# Patient Record
Sex: Male | Born: 1940
Health system: Southern US, Community
[De-identification: ages and names within clinical notes are randomized; demographics above are authoritative.]

## PROBLEM LIST (undated history)

## (undated) DIAGNOSIS — J302 Other seasonal allergic rhinitis: Secondary | ICD-10-CM

## (undated) DIAGNOSIS — E785 Hyperlipidemia, unspecified: Secondary | ICD-10-CM

## (undated) DIAGNOSIS — M1990 Unspecified osteoarthritis, unspecified site: Secondary | ICD-10-CM

## (undated) DIAGNOSIS — L570 Actinic keratosis: Secondary | ICD-10-CM

## (undated) DIAGNOSIS — R569 Unspecified convulsions: Secondary | ICD-10-CM

## (undated) DIAGNOSIS — E209 Hypoparathyroidism, unspecified: Secondary | ICD-10-CM

## (undated) DIAGNOSIS — I10 Essential (primary) hypertension: Secondary | ICD-10-CM

## (undated) DIAGNOSIS — H269 Unspecified cataract: Secondary | ICD-10-CM

## (undated) DIAGNOSIS — J45909 Unspecified asthma, uncomplicated: Secondary | ICD-10-CM

## (undated) DIAGNOSIS — L709 Acne, unspecified: Secondary | ICD-10-CM

## (undated) HISTORY — DX: Unspecified osteoarthritis, unspecified site: M19.90

## (undated) HISTORY — DX: Unspecified cataract: H26.9

## (undated) HISTORY — DX: Acne, unspecified: L70.9

## (undated) HISTORY — DX: Hyperlipidemia, unspecified: E78.5

## (undated) HISTORY — DX: Actinic keratosis: L57.0

## (undated) HISTORY — DX: Other seasonal allergic rhinitis: J30.2

## (undated) HISTORY — PX: CATARACT EXTRACTION, BILATERAL: SHX1313

## (undated) HISTORY — PX: OTHER SURGICAL HISTORY: SHX169

## (undated) HISTORY — PX: APPENDECTOMY: SHX54

## (undated) HISTORY — PX: REPLACEMENT TOTAL KNEE: SUR1224

## (undated) HISTORY — DX: Unspecified asthma, uncomplicated: J45.909

## (undated) HISTORY — DX: Hypoparathyroidism, unspecified: E20.9

## (undated) HISTORY — DX: Unspecified convulsions: R56.9

---

## 2004-12-15 ENCOUNTER — Encounter: Admission: RE | Admit: 2004-12-15 | Discharge: 2004-12-15 | Payer: Self-pay | Admitting: Sports Medicine

## 2005-02-22 ENCOUNTER — Ambulatory Visit: Payer: Self-pay | Admitting: Family Medicine

## 2005-03-01 ENCOUNTER — Ambulatory Visit: Payer: Self-pay | Admitting: Family Medicine

## 2005-03-26 ENCOUNTER — Ambulatory Visit: Payer: Self-pay | Admitting: Family Medicine

## 2005-04-23 ENCOUNTER — Ambulatory Visit: Payer: Self-pay | Admitting: Family Medicine

## 2006-04-08 ENCOUNTER — Ambulatory Visit: Payer: Self-pay | Admitting: Family Medicine

## 2006-04-15 ENCOUNTER — Ambulatory Visit: Payer: Self-pay | Admitting: Family Medicine

## 2006-04-23 ENCOUNTER — Ambulatory Visit: Payer: Self-pay | Admitting: Family Medicine

## 2007-04-24 DIAGNOSIS — J309 Allergic rhinitis, unspecified: Secondary | ICD-10-CM | POA: Insufficient documentation

## 2007-04-24 DIAGNOSIS — L719 Rosacea, unspecified: Secondary | ICD-10-CM | POA: Insufficient documentation

## 2007-05-05 ENCOUNTER — Ambulatory Visit: Payer: Self-pay | Admitting: Family Medicine

## 2007-05-05 LAB — CONVERTED CEMR LAB
ALT: 24 units/L (ref 0–53)
AST: 23 units/L (ref 0–37)
Albumin: 4 g/dL (ref 3.5–5.2)
Alkaline Phosphatase: 66 units/L (ref 39–117)
Basophils Relative: 2.9 % — ABNORMAL HIGH (ref 0.0–1.0)
Chloride: 105 meq/L (ref 96–112)
Eosinophils Absolute: 0.8 10*3/uL — ABNORMAL HIGH (ref 0.0–0.6)
Eosinophils Relative: 9.9 % — ABNORMAL HIGH (ref 0.0–5.0)
GFR calc Af Amer: 97 mL/min
GFR calc non Af Amer: 80 mL/min
Glucose, Urine, Semiquant: NEGATIVE
HCT: 43.7 % (ref 39.0–52.0)
Ketones, urine, test strip: NEGATIVE
MCHC: 36.1 g/dL — ABNORMAL HIGH (ref 30.0–36.0)
Neutro Abs: 4.6 10*3/uL (ref 1.4–7.7)
Nitrite: NEGATIVE
PSA: 1.23 ng/mL (ref 0.10–4.00)
Potassium: 3.9 meq/L (ref 3.5–5.1)
RDW: 12 % (ref 11.5–14.6)
Total Bilirubin: 1 mg/dL (ref 0.3–1.2)
Total CHOL/HDL Ratio: 4.9
WBC: 7.6 10*3/uL (ref 4.5–10.5)

## 2007-05-12 ENCOUNTER — Ambulatory Visit: Payer: Self-pay | Admitting: Family Medicine

## 2007-05-12 DIAGNOSIS — E785 Hyperlipidemia, unspecified: Secondary | ICD-10-CM | POA: Insufficient documentation

## 2007-05-12 DIAGNOSIS — M2410 Other articular cartilage disorders, unspecified site: Secondary | ICD-10-CM | POA: Insufficient documentation

## 2007-05-14 ENCOUNTER — Encounter (INDEPENDENT_AMBULATORY_CARE_PROVIDER_SITE_OTHER): Payer: Self-pay | Admitting: *Deleted

## 2007-05-15 ENCOUNTER — Ambulatory Visit: Payer: Self-pay | Admitting: Gastroenterology

## 2007-05-29 ENCOUNTER — Encounter: Payer: Self-pay | Admitting: Gastroenterology

## 2007-05-29 ENCOUNTER — Encounter: Payer: Self-pay | Admitting: Family Medicine

## 2007-05-29 ENCOUNTER — Ambulatory Visit: Payer: Self-pay | Admitting: Gastroenterology

## 2007-11-07 ENCOUNTER — Encounter: Payer: Self-pay | Admitting: Family Medicine

## 2008-02-09 ENCOUNTER — Ambulatory Visit: Payer: Self-pay | Admitting: Family Medicine

## 2008-02-09 DIAGNOSIS — H6120 Impacted cerumen, unspecified ear: Secondary | ICD-10-CM | POA: Insufficient documentation

## 2008-05-04 ENCOUNTER — Ambulatory Visit: Payer: Self-pay | Admitting: Family Medicine

## 2008-05-04 LAB — CONVERTED CEMR LAB
Albumin: 4.1 g/dL (ref 3.5–5.2)
Basophils Absolute: 0 10*3/uL (ref 0.0–0.1)
Basophils Relative: 0.4 % (ref 0.0–3.0)
Bilirubin, Direct: 0.1 mg/dL (ref 0.0–0.3)
Creatinine, Ser: 1.1 mg/dL (ref 0.4–1.5)
Eosinophils Absolute: 0.5 10*3/uL (ref 0.0–0.7)
Eosinophils Relative: 7.2 % — ABNORMAL HIGH (ref 0.0–5.0)
GFR calc Af Amer: 86 mL/min
GFR calc non Af Amer: 71 mL/min
Glucose, Bld: 90 mg/dL (ref 70–99)
Glucose, Urine, Semiquant: NEGATIVE
Hemoglobin: 16 g/dL (ref 13.0–17.0)
MCV: 97 fL (ref 78.0–100.0)
Neutro Abs: 4.6 10*3/uL (ref 1.4–7.7)
Neutrophils Relative %: 60.3 % (ref 43.0–77.0)
Nitrite: NEGATIVE
Protein, U semiquant: NEGATIVE
Total Bilirubin: 1.1 mg/dL (ref 0.3–1.2)
Total CHOL/HDL Ratio: 4.1
Total Protein: 7.5 g/dL (ref 6.0–8.3)
Urobilinogen, UA: 0.2
WBC: 7.6 10*3/uL (ref 4.5–10.5)

## 2008-05-20 ENCOUNTER — Ambulatory Visit: Payer: Self-pay | Admitting: Family Medicine

## 2008-05-20 DIAGNOSIS — M1990 Unspecified osteoarthritis, unspecified site: Secondary | ICD-10-CM

## 2008-06-16 ENCOUNTER — Telehealth: Payer: Self-pay | Admitting: Family Medicine

## 2008-06-28 ENCOUNTER — Telehealth: Payer: Self-pay | Admitting: Family Medicine

## 2008-06-29 ENCOUNTER — Telehealth: Payer: Self-pay | Admitting: Family Medicine

## 2008-06-29 ENCOUNTER — Ambulatory Visit: Payer: Self-pay | Admitting: Family Medicine

## 2008-07-07 ENCOUNTER — Ambulatory Visit: Payer: Self-pay | Admitting: Family Medicine

## 2009-05-19 ENCOUNTER — Telehealth: Payer: Self-pay | Admitting: Family Medicine

## 2009-05-24 ENCOUNTER — Ambulatory Visit: Payer: Self-pay | Admitting: Family Medicine

## 2009-05-24 DIAGNOSIS — I10 Essential (primary) hypertension: Secondary | ICD-10-CM | POA: Insufficient documentation

## 2009-05-26 ENCOUNTER — Telehealth: Payer: Self-pay | Admitting: Family Medicine

## 2009-05-31 ENCOUNTER — Telehealth (INDEPENDENT_AMBULATORY_CARE_PROVIDER_SITE_OTHER): Payer: Self-pay | Admitting: *Deleted

## 2009-06-01 ENCOUNTER — Encounter: Payer: Self-pay | Admitting: Internal Medicine

## 2009-06-01 ENCOUNTER — Ambulatory Visit: Payer: Self-pay

## 2009-06-21 ENCOUNTER — Encounter: Payer: Self-pay | Admitting: *Deleted

## 2009-06-21 ENCOUNTER — Telehealth: Payer: Self-pay | Admitting: Family Medicine

## 2009-06-27 ENCOUNTER — Inpatient Hospital Stay (HOSPITAL_COMMUNITY): Admission: RE | Admit: 2009-06-27 | Discharge: 2009-06-29 | Payer: Self-pay | Admitting: Orthopedic Surgery

## 2009-08-24 ENCOUNTER — Ambulatory Visit: Payer: Self-pay | Admitting: Family Medicine

## 2009-10-31 ENCOUNTER — Inpatient Hospital Stay (HOSPITAL_COMMUNITY): Admission: RE | Admit: 2009-10-31 | Discharge: 2009-11-02 | Payer: Self-pay | Admitting: Orthopedic Surgery

## 2010-06-01 ENCOUNTER — Ambulatory Visit: Payer: Self-pay | Admitting: Family Medicine

## 2010-06-07 ENCOUNTER — Telehealth: Payer: Self-pay | Admitting: Family Medicine

## 2010-07-18 ENCOUNTER — Telehealth: Payer: Self-pay | Admitting: Family Medicine

## 2010-07-21 ENCOUNTER — Ambulatory Visit: Payer: Self-pay | Admitting: Family Medicine

## 2010-11-12 LAB — CONVERTED CEMR LAB
ALT: 23 units/L (ref 0–53)
ALT: 28 units/L (ref 0–53)
Albumin: 4.1 g/dL (ref 3.5–5.2)
Albumin: 4.2 g/dL (ref 3.5–5.2)
Alkaline Phosphatase: 76 units/L (ref 39–117)
Alkaline Phosphatase: 79 units/L (ref 39–117)
BUN: 22 mg/dL (ref 6–23)
Basophils Relative: 0.3 % (ref 0.0–3.0)
Basophils Relative: 0.5 % (ref 0.0–3.0)
Bilirubin, Direct: 0.1 mg/dL (ref 0.0–0.3)
Blood in Urine, dipstick: NEGATIVE
CO2: 30 meq/L (ref 19–32)
Calcium: 10.3 mg/dL (ref 8.4–10.5)
Calcium: 10.6 mg/dL — ABNORMAL HIGH (ref 8.4–10.5)
Chloride: 104 meq/L (ref 96–112)
Cholesterol: 125 mg/dL (ref 0–200)
Creatinine, Ser: 1 mg/dL (ref 0.4–1.5)
Creatinine, Ser: 1.1 mg/dL (ref 0.4–1.5)
Eosinophils Absolute: 0.4 10*3/uL (ref 0.0–0.7)
Eosinophils Relative: 5.4 % — ABNORMAL HIGH (ref 0.0–5.0)
GFR calc non Af Amer: 70.85 mL/min (ref >60)
Glucose, Bld: 75 mg/dL (ref 70–99)
Glucose, Urine, Semiquant: NEGATIVE
Glucose, Urine, Semiquant: NEGATIVE
HCT: 43.9 % (ref 39.0–52.0)
Hemoglobin: 15.1 g/dL (ref 13.0–17.0)
LDL Cholesterol: 65 mg/dL (ref 0–99)
LDL Cholesterol: 67 mg/dL (ref 0–99)
Lymphocytes Relative: 29.2 % (ref 12.0–46.0)
MCV: 96.5 fL (ref 78.0–100.0)
Monocytes Relative: 7.6 % (ref 3.0–12.0)
Monocytes Relative: 8.5 % (ref 3.0–12.0)
Neutro Abs: 6.1 10*3/uL (ref 1.4–7.7)
Neutrophils Relative %: 56.4 % (ref 43.0–77.0)
PSA: 1.31 ng/mL (ref 0.10–4.00)
PSA: 1.51 ng/mL (ref 0.10–4.00)
Platelets: 179 10*3/uL (ref 150.0–400.0)
Platelets: 198 10*3/uL (ref 150.0–400.0)
Potassium: 4.6 meq/L (ref 3.5–5.1)
Potassium: 4.9 meq/L (ref 3.5–5.1)
RBC: 4.79 M/uL (ref 4.22–5.81)
RDW: 11.6 % (ref 11.5–14.6)
RDW: 12.8 % (ref 11.5–14.6)
Specific Gravity, Urine: 1.02
TSH: 0.82 microintl units/mL (ref 0.35–5.50)
TSH: 0.97 microintl units/mL (ref 0.35–5.50)
Total Protein: 7.3 g/dL (ref 6.0–8.3)
Triglycerides: 143 mg/dL (ref 0.0–149.0)
Urobilinogen, UA: 0.2
Urobilinogen, UA: 0.2
VLDL: 26 mg/dL (ref 0.0–40.0)
VLDL: 28.6 mg/dL (ref 0.0–40.0)
WBC Urine, dipstick: NEGATIVE
WBC Urine, dipstick: NEGATIVE
WBC: 8.1 10*3/uL (ref 4.5–10.5)
pH: 5.5

## 2010-11-15 NOTE — Progress Notes (Signed)
Summary: lab results  Phone Note Call from Patient Call back at Home Phone [phone]   Caller: Spouse Call For: Roderick Pee MD Summary of Call: Wife is asking questions regarding pt's labs from [facility].  Please call. Initial call taken by: Lynann Beaver CMA,  June 07, 2010 10:09 AM  Follow-up for Phone Call         Fleet Contras please call ......... labs normal>>>>>>>>>>//////// Follow-up by: Roderick Pee MD,  June 07, 2010 10:55 AM  Additional Follow-up for Phone Call Additional follow up Details #1::        Phone Call Completed Additional Follow-up by: Kern Reap CMA Duncan Dull),  June 07, 2010 12:59 PM

## 2010-11-15 NOTE — Progress Notes (Signed)
Summary: questions about flu clinic?  Phone Note Call from Patient Call back at Home Phone [phone]   Caller: Patient Call For: Roderick Pee MD Action Taken: Provider Notified Summary of Call: Pt is calling to see if he could come to a flu clinic and get 1.  flu shot   2.  shingles injection   3.  pneumonia (last pneumonia was08/07/2009) Call back with appt please. Initial call taken by: Lynann Beaver CMA,  July 18, 2010 9:58 AM  Follow-up for Phone Call        Fleet Contras please call Follow-up by: Roderick Pee MD,  July 18, 2010 10:09 AM  Additional Follow-up for Phone Call Additional follow up Details #1::        pt will call back to schedule an injection appointment Additional Follow-up by: Kern Reap CMA Duncan Dull),  July 18, 2010 10:32 AM

## 2010-11-15 NOTE — Assessment & Plan Note (Signed)
Summary: [facility] (PT WILL COME IN FASTING) // RS   Vital Signs:  Patient profile:   70 year old male Height:      69.5 inches Weight:      184 pounds BMI:     26.88 Temp:     98.2 degrees F oral BP sitting:   120 / 80  (left arm) Cuff size:   regular  Vitals Entered By: Kern Reap CMA Duncan Dull) (June 01, 2010 10:33 AM) CC: wellness exam Is Patient Diabetic? No   CC:  wellness exam.  History of Present Illness: Paul Lowe is a 70 year old, married male, nonsmoker professor, still working part time, who comes in today for physical examination and to discuss allergic rhinitis, hyperlipidemia, hypertension, and osteoarthritis.  For his allergic rhinitis.  He takes Claritin 10 mg daily.  He also takes Niaspan 500 mg daily and simvastatin 20 mg nightly for hyperlipidemia.  Will check lipid panel today.  He takes Zestoretic 20 -- 2.5 daily for hypertension. bp........Marland Kitchen 120/80.  He takes Mobic 15 mg daily for osteoarthritis, and 181 mg, baby aspirin daily.  He gets routine eye care.  Dental care.  Colonoscopy normal, tetanus, 2006, seasonal flu 2010, Pneumovax 2010, cardiac stress test last year.  Prior to knee surgery, normal.  He's had both knees replaced as noted above. Here for Medicare AWV:  1.   Risk factors based on Past M, S, F history:......reviewed in detail in a chain 2.   Physical Activities:Marland Kitchen..walks daily  3.   Depression/mood: good mood.  No depression 4.   Hearing: normal 5.   ADL's: reviewed.  No changes 6.   Fall Risk: reviewed none 7.   Home Safety: reviewed.  No changes 8.   Height, weight, &visual acuity:height weight, normal bilateral cataract removal and lens implants.  Vision normal 9.   Counseling: continue exercise program and medications 10.   Labs ordered based on risk factors: done today 11.           Referral Coordination.........none indicated 12.           Care Plan/////////reviewed in detail 13.            Cognitive Assessment   Allergies: 1)  !  Shrimp Flavor (Flavoring Agent)  Past History:  Past medical, surgical, family and social histories (including risk factors) reviewed, and no changes noted (except as noted below).  Past Medical History: Reviewed history from 05/20/2008 and no changes required. Allergic rhinitis Hyperlipidemia Osteoarthritis adult acne appendectomy  anal cystsbilateral cataracts  Family History: Reviewed history from 05/20/2008 and no changes required.  father died age 20 of a plane crash.  No underlying diseases mother died in her 24s of a strokeone half brother in good health  Social History: Reviewed history from 02/09/2008 and no changes required. 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  The summer school Never Smoked Alcohol use-no Drug use-no Regular exercise-yes  Review of Systems      See HPI  Physical Exam  General:  Well-developed,well-nourished,in no acute distress; alert,appropriate and cooperative throughout examination Head:  Normocephalic and atraumatic without obvious abnormalities. No apparent alopecia or balding. Eyes:  No corneal or conjunctival inflammation noted. EOMI. Perrla. Funduscopic exam benign, without hemorrhages, exudates or papilledema. Vision grossly normal. Ears:  External ear exam shows no significant lesions or deformities.  Otoscopic examination reveals clear canals, tympanic membranes are intact bilaterally without bulging, retraction, inflammation or discharge. Hearing is grossly normal bilaterally. Nose:  External  nasal examination shows no deformity or inflammation. Nasal mucosa are pink and moist without lesions or exudates. Mouth:  Oral mucosa and oropharynx without lesions or exudates.  Teeth in good repair. Neck:  No deformities, masses, or tenderness noted. Chest Wall:  No deformities, masses, tenderness or gynecomastia noted. Breasts:  No masses or gynecomastia noted Lungs:   Normal respiratory effort, chest expands symmetrically. Lungs are clear to auscultation, no crackles or wheezes. Heart:  Normal rate and regular rhythm. S1 and S2 normal without gallop, murmur, click, rub or other extra sounds. Abdomen:  Bowel sounds positive,abdomen soft and non-tender without masses, organomegaly or hernias noted. Rectal:  No external abnormalities noted. Normal sphincter tone. No rectal masses or tenderness. Genitalia:  Testes bilaterally descended without nodularity, tenderness or masses. No scrotal masses or lesions. No penis lesions or urethral discharge. Prostate:  Prostate gland firm and smooth, no enlargement, nodularity, tenderness, mass, asymmetry or induration. Msk:  No deformity or scoliosis noted of thoracic or lumbar spine.   Pulses:  R and L carotid,radial,femoral,dorsalis pedis and posterior tibial pulses are full and equal bilaterally Extremities:  No clubbing, cyanosis, edema, or deformity noted with normal full range of motion of all joints.   Neurologic:  No cranial nerve deficits noted. Station and gait are normal. Plantar reflexes are down-going bilaterally. DTRs are symmetrical throughout. Sensory, motor and coordinative functions appear intact. Skin:  Intact without suspicious lesions or rashes Cervical Nodes:  No lymphadenopathy noted Axillary Nodes:  No palpable lymphadenopathy Inguinal Nodes:  No significant adenopathy Psych:  Cognition and judgment appear intact. Alert and cooperative with normal attention span and concentration. No apparent delusions, illusions, hallucinations   Impression & Recommendations:  Problem # 1:  HYPERTENSION NEC (ICD-997.91) Assessment Improved  Orders: Venipuncture (95621) TLB-Lipid Panel (80061-LIPID) TLB-BMP (Basic Metabolic Panel-BMET) (80048-METABOL) TLB-CBC Platelet - w/Differential (85025-CBCD) TLB-Hepatic/Liver Function Pnl (80076-HEPATIC) TLB-TSH (Thyroid Stimulating Hormone) (84443-TSH) TLB-PSA (Prostate  Specific Antigen) (84153-PSA) Prescription Created Electronically [phone]) Medicare -1st Annual Wellness Visit [phone]) Urinalysis-dipstick only (Medicare patient) (62952WU) EKG w/ Interpretation (93000)  Problem # 2:  OSTEOARTHRITIS (ICD-715.90) Assessment: Improved  The following medications were removed from the medication list:    Naproxen 500 Mg Tabs (Naproxen) ..... Once daily    Nabumetone 500 Mg Tabs (Nabumetone) ..... Once daily His updated medication list for this problem includes:    Adult Aspirin Ec Low Strength 81 Mg Tbec (Aspirin) ..... Once daily    Mobic 15 Mg Tabs (Meloxicam) .Marland Kitchen... Take one tab once daily  Orders: Venipuncture (13244) TLB-Lipid Panel (80061-LIPID) TLB-BMP (Basic Metabolic Panel-BMET) (80048-METABOL) TLB-CBC Platelet - w/Differential (85025-CBCD) TLB-Hepatic/Liver Function Pnl (80076-HEPATIC) TLB-TSH (Thyroid Stimulating Hormone) (84443-TSH) TLB-PSA (Prostate Specific Antigen) (84153-PSA) Prescription Created Electronically [phone]) Medicare -1st Annual Wellness Visit [phone]) Urinalysis-dipstick only (Medicare patient) (44034VQ)  Problem # 3:  HYPERLIPIDEMIA (ICD-272.4) Assessment: Improved  His updated medication list for this problem includes:    Simvastatin 40 Mg Tabs (Simvastatin) .Marland Kitchen... 1/2 tab at bedtime    Niaspan 500 Mg Tbcr (Niacin (antihyperlipidemic)) ..... Qd  Orders: Venipuncture (25956) TLB-Lipid Panel (80061-LIPID) TLB-BMP (Basic Metabolic Panel-BMET) (80048-METABOL) TLB-CBC Platelet - w/Differential (85025-CBCD) TLB-Hepatic/Liver Function Pnl (80076-HEPATIC) TLB-TSH (Thyroid Stimulating Hormone) (84443-TSH) TLB-PSA (Prostate Specific Antigen) (84153-PSA) Prescription Created Electronically [phone]) Medicare -1st Annual Wellness Visit [phone]) Urinalysis-dipstick only (Medicare patient) (51884ZY) EKG w/ Interpretation (93000)  Problem # 4:  HEALTH SCREENING (ICD-V70.0) Assessment: Unchanged  Orders: Venipuncture  (60630) TLB-Lipid Panel (80061-LIPID) TLB-BMP (Basic Metabolic Panel-BMET) (80048-METABOL) TLB-CBC Platelet - w/Differential (85025-CBCD) TLB-Hepatic/Liver Function Pnl (80076-HEPATIC) TLB-TSH (Thyroid  Stimulating Hormone) (84443-TSH) TLB-PSA (Prostate Specific Antigen) (84153-PSA) Prescription Created Electronically [phone]) Medicare -1st Annual Wellness Visit [phone]) Urinalysis-dipstick only (Medicare patient) [phone])  Problem # 5:  ACNE ROSACEA (ICD-695.3) Assessment: Improved  Orders: Venipuncture (47829) TLB-Lipid Panel (80061-LIPID) TLB-BMP (Basic Metabolic Panel-BMET) (80048-METABOL) TLB-CBC Platelet - w/Differential (85025-CBCD) TLB-Hepatic/Liver Function Pnl (80076-HEPATIC) TLB-TSH (Thyroid Stimulating Hormone) (84443-TSH) TLB-PSA (Prostate Specific Antigen) (84153-PSA) Prescription Created Electronically [phone]) Medicare -1st Annual Wellness Visit [phone]) Urinalysis-dipstick only (Medicare patient) (84696EX)  Problem # 6:  ALLERGIC RHINITIS (ICD-477.9) Assessment: Improved  His updated medication list for this problem includes:    Claritin 10 Mg Tabs (Loratadine) ..... Qd  Orders: Venipuncture (52841) TLB-Lipid Panel (80061-LIPID) TLB-BMP (Basic Metabolic Panel-BMET) (80048-METABOL) TLB-CBC Platelet - w/Differential (85025-CBCD) TLB-Hepatic/Liver Function Pnl (80076-HEPATIC) TLB-TSH (Thyroid Stimulating Hormone) (84443-TSH) TLB-PSA (Prostate Specific Antigen) (84153-PSA) Prescription Created Electronically [phone]) Medicare -1st Annual Wellness Visit [phone]) Urinalysis-dipstick only (Medicare patient) (53664QI)  Complete Medication List: 1)  Zestoretic 20-12.5 Mg Tabs (Lisinopril-hydrochlorothiazide) .... Once daily 2)  Claritin 10 Mg Tabs (Loratadine) .... Qd 3)  Simvastatin 40 Mg Tabs (Simvastatin) .... 1/2 tab at bedtime 4)  Niaspan 500 Mg Tbcr (Niacin (antihyperlipidemic)) .... Qd 5)  Adult Aspirin Ec Low Strength 81 Mg Tbec (Aspirin) .... Once daily 6)   Mvi  7)  Vit C  8)  Mobic 15 Mg Tabs (Meloxicam) .... Take one tab once daily  Patient Instructions: 1)  Please schedule a follow-up appointment in 1 year. Prescriptions: MOBIC 15 MG  TABS (MELOXICAM) take one tab once daily  #100 x 3   Entered and Authorized by:   Roderick Pee MD   Signed by:   Roderick Pee MD on 06/01/2010   Method used:   Electronically to        [facility] [address] [phone]* (retail)       [address]       [place], [place]  [address]       Ph: [phone] or [phone]       Fax: [phone]   RxID:   [id number] NIASPAN 500 MG  TBCR (NIACIN (ANTIHYPERLIPIDEMIC)) qd  #100 x 3   Entered and Authorized by:   Roderick Pee MD   Signed by:   Roderick Pee MD on 06/01/2010   Method used:   Electronically to        [facility] [address] [phone]* (retail)       [address]       [place], [place]  [address]       Ph: [phone] or [phone]       Fax: [phone]   RxID:   [id number] SIMVASTATIN 40 MG  TABS (SIMVASTATIN) 1/2 tab at bedtime  #50 x 3   Entered and Authorized by:   Roderick Pee MD   Signed by:   Roderick Pee MD on 06/01/2010   Method used:   Electronically to        [facility] [address] [phone]* (retail)       [address]       [place], [place]  [address]       Ph: [phone] or [phone]       Fax: [phone]   RxID:   [id number] ZESTORETIC 20-12.5 MG TABS (LISINOPRIL-HYDROCHLOROTHIAZIDE) once daily  #100 x 3  Entered and Authorized by:   Roderick Pee MD   Signed by:   Roderick Pee MD on 06/01/2010   Method used:   Electronically to        [facility] [address] [phone]* (retail)       [address]       [place], [place]  [address]       Ph: [phone] or [phone]       Fax: [phone]   RxID:   [id number]      Laboratory Results   Urine Tests    Routine Urinalysis    Color: yellow Appearance: Clear Glucose: negative   (Normal Range: Negative) Bilirubin: negative   (Normal Range: Negative) Ketone: negative   (Normal Range: Negative) Spec. Gravity: 1.020   (Normal Range: 1.003-1.035) Blood: negative   (Normal Range: Negative) pH: 7.0   (Normal Range: 5.0-8.0) Protein: negative   (Normal Range: Negative) Urobilinogen: 0.2   (Normal Range: 0-1) Nitrite: negative   (Normal Range: Negative) Leukocyte Esterace: negative   (Normal Range: Negative)    Comments: Rita Ohara  June 01, 2010 12:11 PM

## 2010-11-15 NOTE — Assessment & Plan Note (Signed)
Summary: flu shot/shingle vaccine/njr  Nurse Visit   Review of Systems       Flu Vaccine Consent Questions     Do you have a history of severe allergic reactions to this vaccine? no    Any prior history of allergic reactions to egg and/or gelatin? no    Do you have a sensitivity to the preservative Thimersol? no    Do you have a past history of Guillan-Barre Syndrome? no    Do you currently have an acute febrile illness? no    Have you ever had a severe reaction to latex? no    Vaccine information given and explained to patient? yes    Are you currently pregnant? no    Lot Number:[id number]   Exp Date:04/14/2011   Site Given  Right  Deltoid IM    Allergies: 1)  ! Shrimp Flavor (Flavoring Agent)  Immunizations Administered:  Zostavax # 1:    Vaccine Type: Zostavax    Site: left deltoid    Mfr: Merck    Dose: 0.65    Route: [place]    Given by: Kern Reap CMA (AAMA)    Exp. Date: 06/02/2011    Lot #: [id number]    Physician counseled: yes  Orders Added: 1)  Flu Vaccine 59yrs + MEDICARE PATIENTS [Q2039] 2)  Administration Flu vaccine - MCR [G0008] 3)  Zoster (Shingles) Vaccine Live [90736] 4)  Admin 1st Vaccine [91478]

## 2010-12-31 LAB — COMPREHENSIVE METABOLIC PANEL
ALT: 27 U/L (ref 0–53)
AST: 27 U/L (ref 0–37)
CO2: 28 mEq/L (ref 19–32)
Chloride: 100 mEq/L (ref 96–112)
GFR calc Af Amer: >60 mL/min (ref >60)
GFR calc non Af Amer: >60 mL/min (ref >60)
Glucose, Bld: 95 mg/dL (ref 70–99)
Potassium: 4.7 mEq/L (ref 3.5–5.1)
Sodium: 135 mEq/L (ref 135–145)
Total Bilirubin: 0.9 mg/dL (ref 0.3–1.2)

## 2010-12-31 LAB — CBC
HCT: 46.8 % (ref 39.0–52.0)
HCT: 36.4 % — ABNORMAL LOW (ref 39.0–52.0)
HCT: 37.3 % — ABNORMAL LOW (ref 39.0–52.0)
Hemoglobin: 12.7 g/dL — ABNORMAL LOW (ref 13.0–17.0)
Hemoglobin: 12.9 g/dL — ABNORMAL LOW (ref 13.0–17.0)
MCHC: 34.7 g/dL (ref 30.0–36.0)
MCHC: 34.9 g/dL (ref 30.0–36.0)
Platelets: 175 10*3/uL (ref 150–400)
RBC: 3.74 MIL/uL — ABNORMAL LOW (ref 4.22–5.81)
WBC: 9.1 10*3/uL (ref 4.0–10.5)
WBC: 10.4 10*3/uL (ref 4.0–10.5)
WBC: 13.9 10*3/uL — ABNORMAL HIGH (ref 4.0–10.5)

## 2010-12-31 LAB — URINALYSIS, ROUTINE W REFLEX MICROSCOPIC
Bilirubin Urine: NEGATIVE
Glucose, UA: NEGATIVE mg/dL
Nitrite: NEGATIVE
Specific Gravity, Urine: 1.012 (ref 1.005–1.030)
Urobilinogen, UA: 0.2 mg/dL (ref 0.0–1.0)

## 2010-12-31 LAB — URINE CULTURE

## 2010-12-31 LAB — DIFFERENTIAL
Eosinophils Absolute: 0.5 10*3/uL (ref 0.0–0.7)
Lymphocytes Relative: 27 % (ref 12–46)
Monocytes Absolute: 0.7 10*3/uL (ref 0.1–1.0)
Neutro Abs: 5.4 10*3/uL (ref 1.7–7.7)

## 2010-12-31 LAB — URINE MICROSCOPIC-ADD ON

## 2010-12-31 LAB — BASIC METABOLIC PANEL
CO2: 24 mEq/L (ref 19–32)
CO2: 28 mEq/L (ref 19–32)
Calcium: 9.4 mg/dL (ref 8.4–10.5)
Creatinine, Ser: 1.06 mg/dL (ref 0.4–1.5)
GFR calc Af Amer: >60 mL/min (ref >60)
GFR calc Af Amer: >60 mL/min (ref >60)
GFR calc non Af Amer: >60 mL/min (ref >60)
Potassium: 5.1 mEq/L (ref 3.5–5.1)
Sodium: 135 mEq/L (ref 135–145)

## 2010-12-31 LAB — PROTIME-INR
INR: 1.02 (ref 0.00–1.49)
INR: 1.73 — ABNORMAL HIGH (ref 0.00–1.49)
Prothrombin Time: 13.3 seconds (ref 11.6–15.2)
Prothrombin Time: 13.9 seconds (ref 11.6–15.2)
Prothrombin Time: 20.1 seconds — ABNORMAL HIGH (ref 11.6–15.2)

## 2010-12-31 LAB — TYPE AND SCREEN: ABO/RH(D): O POS

## 2011-01-19 LAB — CBC
HCT: 35.2 % — ABNORMAL LOW (ref 39.0–52.0)
HCT: 46.9 % (ref 39.0–52.0)
Hemoglobin: 12.4 g/dL — ABNORMAL LOW (ref 13.0–17.0)
Hemoglobin: 13.4 g/dL (ref 13.0–17.0)
MCHC: 36 g/dL (ref 30.0–36.0)
MCV: 98.5 fL (ref 78.0–100.0)
MCV: 98.8 fL (ref 78.0–100.0)
Platelets: 206 10*3/uL (ref 150–400)
RBC: 3.56 MIL/uL — ABNORMAL LOW (ref 4.22–5.81)
RBC: 3.82 MIL/uL — ABNORMAL LOW (ref 4.22–5.81)
RDW: 12.7 % (ref 11.5–15.5)
WBC: 8.8 10*3/uL (ref 4.0–10.5)

## 2011-01-19 LAB — COMPREHENSIVE METABOLIC PANEL
AST: 35 U/L (ref 0–37)
Albumin: 4.4 g/dL (ref 3.5–5.2)
BUN: 15 mg/dL (ref 6–23)
Creatinine, Ser: 1.03 mg/dL (ref 0.4–1.5)
GFR calc Af Amer: >60 mL/min (ref >60)
Potassium: 5 mEq/L (ref 3.5–5.1)
Total Protein: 7.2 g/dL (ref 6.0–8.3)

## 2011-01-19 LAB — URINALYSIS, ROUTINE W REFLEX MICROSCOPIC
Ketones, ur: NEGATIVE mg/dL
Nitrite: NEGATIVE
Specific Gravity, Urine: 1.019 (ref 1.005–1.030)
pH: 6.5 (ref 5.0–8.0)

## 2011-01-19 LAB — APTT: aPTT: 31 seconds (ref 24–37)

## 2011-01-19 LAB — DIFFERENTIAL
Lymphocytes Relative: 28 % (ref 12–46)
Monocytes Absolute: 0.7 10*3/uL (ref 0.1–1.0)
Monocytes Relative: 7 % (ref 3–12)
Neutro Abs: 5.3 10*3/uL (ref 1.7–7.7)

## 2011-01-19 LAB — URINE CULTURE: Colony Count: NO GROWTH

## 2011-01-19 LAB — BASIC METABOLIC PANEL
BUN: 11 mg/dL (ref 6–23)
CO2: 27 mEq/L (ref 19–32)
Calcium: 9.3 mg/dL (ref 8.4–10.5)
Chloride: 106 mEq/L (ref 96–112)
GFR calc Af Amer: >60 mL/min (ref >60)
GFR calc Af Amer: >60 mL/min (ref >60)
Potassium: 4 mEq/L (ref 3.5–5.1)
Sodium: 131 mEq/L — ABNORMAL LOW (ref 135–145)

## 2011-01-19 LAB — PROTIME-INR: INR: 1.2 (ref 0.00–1.49)

## 2011-01-19 LAB — TYPE AND SCREEN: Antibody Screen: NEGATIVE

## 2011-06-07 ENCOUNTER — Other Ambulatory Visit: Payer: Self-pay | Admitting: Family Medicine

## 2011-06-29 ENCOUNTER — Other Ambulatory Visit: Payer: Self-pay | Admitting: Family Medicine

## 2011-07-18 ENCOUNTER — Encounter: Payer: Self-pay | Admitting: Family Medicine

## 2011-07-19 ENCOUNTER — Encounter: Payer: Self-pay | Admitting: Family Medicine

## 2011-07-19 ENCOUNTER — Ambulatory Visit (INDEPENDENT_AMBULATORY_CARE_PROVIDER_SITE_OTHER): Payer: Medicare Other | Admitting: Family Medicine

## 2011-07-19 DIAGNOSIS — IMO0002 Reserved for concepts with insufficient information to code with codable children: Secondary | ICD-10-CM

## 2011-07-19 DIAGNOSIS — Z125 Encounter for screening for malignant neoplasm of prostate: Secondary | ICD-10-CM

## 2011-07-19 DIAGNOSIS — Z0000 Encounter for general adult medical examination without abnormal findings: Secondary | ICD-10-CM

## 2011-07-19 DIAGNOSIS — M1990 Unspecified osteoarthritis, unspecified site: Secondary | ICD-10-CM

## 2011-07-19 DIAGNOSIS — I10 Essential (primary) hypertension: Secondary | ICD-10-CM

## 2011-07-19 DIAGNOSIS — Z23 Encounter for immunization: Secondary | ICD-10-CM

## 2011-07-19 DIAGNOSIS — E785 Hyperlipidemia, unspecified: Secondary | ICD-10-CM

## 2011-07-19 LAB — CBC WITH DIFFERENTIAL/PLATELET
Basophils Relative: 0.3 % (ref 0.0–3.0)
Eosinophils Absolute: 0.3 10*3/uL (ref 0.0–0.7)
HCT: 46.1 % (ref 39.0–52.0)
Hemoglobin: 15.9 g/dL (ref 13.0–17.0)
MCHC: 34.4 g/dL (ref 30.0–36.0)
MCV: 98.5 fl (ref 78.0–100.0)
Monocytes Absolute: 0.8 10*3/uL (ref 0.1–1.0)
Neutro Abs: 6.4 10*3/uL (ref 1.4–7.7)
RBC: 4.68 Mil/uL (ref 4.22–5.81)

## 2011-07-19 LAB — POCT URINALYSIS DIPSTICK
Bilirubin, UA: NEGATIVE
Glucose, UA: NEGATIVE
Ketones, UA: NEGATIVE
Leukocytes, UA: NEGATIVE
pH, UA: 5.5

## 2011-07-19 LAB — HEPATIC FUNCTION PANEL
ALT: 24 U/L (ref 0–53)
Albumin: 4.4 g/dL (ref 3.5–5.2)
Total Protein: 7.1 g/dL (ref 6.0–8.3)

## 2011-07-19 LAB — TSH: TSH: 0.74 u[IU]/mL (ref 0.35–5.50)

## 2011-07-19 LAB — LIPID PANEL
HDL: 38.6 mg/dL — ABNORMAL LOW (ref >39.00)
Triglycerides: 115 mg/dL (ref 0.0–149.0)

## 2011-07-19 MED ORDER — SIMVASTATIN 40 MG PO TABS: 40.0000 mg | ORAL_TABLET | Freq: Every day | ORAL | Status: DC

## 2011-07-19 MED ORDER — ZESTORETIC 10-12.5 MG PO TABS: 1.0000 | ORAL_TABLET | Freq: Every day | ORAL | Status: DC

## 2011-07-19 MED ORDER — MELOXICAM 15 MG PO TABS: 15.0000 mg | ORAL_TABLET | Freq: Every day | ORAL | Status: DC

## 2011-07-19 NOTE — Progress Notes (Signed)
  Subjective:    Patient ID: Paul Lowe, male    DOB: 1940-11-29, 70 y.o.   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 Who comes in for Medicare wellness examination because of a history of underlying hypertension and hyperlipidemia.  He takes Zestoretic 20 -- 12.5 daily for hypertension.  BP 104/70, and he, states he's lightheaded when he stands up.  Will cut BP dose in half.  He takes Mobic 15 mg daily for A.  He takes Zocor 40 mg nightly along with Niaspan for hyperlipidemia.  He would like to switch to the over-the-counter, Niaspan because of cost.  He gets routine eye care, hearing normal, greater dental care, colonoscopy, normal, tetanus, 2006, shingles 2011, Pneumovax 2010, seasonal flu shot today.  Cognitive function, normal activities of daily living normally walks on a regular basis.  H safety reviewed.  No issues identified.  No bands in the house.  They do have a healthcare power of attorney and living will and   Review of Systems  Constitutional: Negative.   HENT: Negative.   Eyes: Negative.   Respiratory: Negative.   Cardiovascular: Negative.   Gastrointestinal: Negative.   Genitourinary: Negative.   Musculoskeletal: Negative.   Skin: Negative.   Neurological: Negative.   Hematological: Negative.   Psychiatric/Behavioral: Negative.        Objective:   Physical Exam  Constitutional: He is oriented to person, place, and time. He appears well-developed and well-nourished.  HENT:  Head: Normocephalic and atraumatic.  Right Ear: External ear normal.  Left Ear: External ear normal.  Nose: Nose normal.  Mouth/Throat: Oropharynx is clear and moist.  Eyes: Conjunctivae and EOM are normal. Pupils are equal, round, and reactive to light.  Neck: Normal range of motion. Neck supple. No JVD present. No tracheal deviation present. No thyromegaly  present.  Cardiovascular: Normal rate, regular rhythm, normal heart sounds and intact distal pulses.  Exam reveals no gallop and no friction rub.   No murmur heard. Pulmonary/Chest: Effort normal and breath sounds normal. No stridor. No respiratory distress. He has no wheezes. He has no rales. He exhibits no tenderness.  Abdominal: Soft. Bowel sounds are normal. He exhibits no distension and no mass. There is no tenderness. There is no rebound and no guarding.  Genitourinary: Rectum normal, prostate normal and penis normal. Guaiac negative stool. No penile tenderness.  Musculoskeletal: Normal range of motion. He exhibits no edema and no tenderness.  Lymphadenopathy:    He has no cervical adenopathy.  Neurological: He is alert and oriented to person, place, and time. He has normal reflexes. No cranial nerve deficit. He exhibits normal muscle tone.  Skin: Skin is warm and dry. No rash noted. No erythema. No pallor.  Psychiatric: He has a normal mood and affect. His behavior is normal. Judgment and thought content normal.          Assessment & Plan:  Healthy male.  Hypertension.  Blood pressure too low.  Cut medication in half.  Hyperlipidemia.  Continue Zocor.  Patient elects over-the-counter Niaspan.  Osteoarthritis....... Both knees have been replaced two years ago, able to play tennis and........ Continue Mobic 15 mg daily p.r.n.

## 2011-07-19 NOTE — Patient Instructions (Signed)
Decrease the Zestoretic to 10 mg daily.  Check a blood pressure daily for a couple weeks to be sure your blood pressure is normal.......Marland Kitchen 135/85 or less.  Continue good health habits.  Return in one year, sooner if any problems

## 2011-07-24 ENCOUNTER — Encounter: Payer: Self-pay | Admitting: Family Medicine

## 2011-08-20 ENCOUNTER — Telehealth: Payer: Self-pay | Admitting: Family Medicine

## 2011-08-20 NOTE — Telephone Encounter (Signed)
Pharmacy wants to know if pt can use Lisinopril instead Zestoretic. Please call pt back with this information.

## 2011-08-20 NOTE — Telephone Encounter (Signed)
Paul Lowe please clarify medicine

## 2011-08-22 MED ORDER — LISINOPRIL-HYDROCHLOROTHIAZIDE 10-12.5 MG PO TABS: 1.0000 | ORAL_TABLET | Freq: Every day | ORAL | Status: DC

## 2011-08-22 NOTE — Telephone Encounter (Signed)
rx sent and patient is aware 

## 2011-08-22 NOTE — Telephone Encounter (Signed)
Addended by: Kern Reap B on: 08/22/2011 01:33 PM   Modules accepted: Orders

## 2012-06-30 ENCOUNTER — Telehealth: Payer: Self-pay | Admitting: Family Medicine

## 2012-06-30 NOTE — Telephone Encounter (Signed)
Paul Lowe please call its amoxicillin 500 mg,,,,,,, Fortabs one hour prior to procedure

## 2012-06-30 NOTE — Telephone Encounter (Signed)
Pls advise.  

## 2012-06-30 NOTE — Telephone Encounter (Signed)
Caller: [name]/Patient; Patient Name: Paul Lowe; PCP: Kelle Darting [facility]); Best Callback Phone Number: [phone]; Reason for call: Patient is scheduled for a dental visit and his dentist requires him to take an antibiotic before any dental visit. This began after the patient had his knees replaced. Dentist requires PCP to prescribe the antibiotic. Patient is unsure of antibiotic name he has taken in the past before dental appointments. Patient uses [facility] on [address]. Phone number is [phone]. Denied need for triage of any symptoms at this time.

## 2012-07-01 MED ORDER — AMOXICILLIN 500 MG PO CAPS: ORAL_CAPSULE | ORAL | Status: DC

## 2012-07-01 NOTE — Telephone Encounter (Signed)
Rx sent 

## 2012-07-01 NOTE — Telephone Encounter (Signed)
Pt has dental appt at 11am today

## 2012-07-11 ENCOUNTER — Ambulatory Visit (INDEPENDENT_AMBULATORY_CARE_PROVIDER_SITE_OTHER): Payer: Medicare Other

## 2012-07-11 DIAGNOSIS — Z23 Encounter for immunization: Secondary | ICD-10-CM

## 2012-08-05 ENCOUNTER — Ambulatory Visit (INDEPENDENT_AMBULATORY_CARE_PROVIDER_SITE_OTHER): Payer: Medicare Other | Admitting: Family Medicine

## 2012-08-05 ENCOUNTER — Encounter: Payer: Self-pay | Admitting: Family Medicine

## 2012-08-05 VITALS — BP 110/70 | Temp 98.0°F | Ht 70.25 in | Wt 190.0 lb

## 2012-08-05 DIAGNOSIS — Z0000 Encounter for general adult medical examination without abnormal findings: Secondary | ICD-10-CM

## 2012-08-05 DIAGNOSIS — Z125 Encounter for screening for malignant neoplasm of prostate: Secondary | ICD-10-CM

## 2012-08-05 DIAGNOSIS — M1990 Unspecified osteoarthritis, unspecified site: Secondary | ICD-10-CM

## 2012-08-05 DIAGNOSIS — E785 Hyperlipidemia, unspecified: Secondary | ICD-10-CM

## 2012-08-05 DIAGNOSIS — L719 Rosacea, unspecified: Secondary | ICD-10-CM

## 2012-08-05 DIAGNOSIS — J309 Allergic rhinitis, unspecified: Secondary | ICD-10-CM

## 2012-08-05 DIAGNOSIS — IMO0002 Reserved for concepts with insufficient information to code with codable children: Secondary | ICD-10-CM

## 2012-08-05 LAB — POCT URINALYSIS DIPSTICK
Bilirubin, UA: NEGATIVE
Ketones, UA: NEGATIVE
Leukocytes, UA: NEGATIVE
Protein, UA: NEGATIVE
Spec Grav, UA: 1.015
pH, UA: 7

## 2012-08-05 LAB — LIPID PANEL
Cholesterol: 137 mg/dL (ref 0–200)
HDL: 32.9 mg/dL — ABNORMAL LOW (ref >39.00)
LDL Cholesterol: 75 mg/dL (ref 0–99)
Triglycerides: 146 mg/dL (ref 0.0–149.0)
VLDL: 29.2 mg/dL (ref 0.0–40.0)

## 2012-08-05 LAB — CBC WITH DIFFERENTIAL/PLATELET
Basophils Relative: 0.4 % (ref 0.0–3.0)
Eosinophils Relative: 5 % (ref 0.0–5.0)
Lymphocytes Relative: 28.8 % (ref 12.0–46.0)
Monocytes Absolute: 0.7 10*3/uL (ref 0.1–1.0)
Monocytes Relative: 9.5 % (ref 3.0–12.0)
Neutrophils Relative %: 56.3 % (ref 43.0–77.0)
Platelets: 207 10*3/uL (ref 150.0–400.0)
RBC: 4.74 Mil/uL (ref 4.22–5.81)
WBC: 7.8 10*3/uL (ref 4.5–10.5)

## 2012-08-05 LAB — HEPATIC FUNCTION PANEL
ALT: 27 U/L (ref 0–53)
Albumin: 4.1 g/dL (ref 3.5–5.2)
Bilirubin, Direct: 0.2 mg/dL (ref 0.0–0.3)
Total Protein: 7 g/dL (ref 6.0–8.3)

## 2012-08-05 LAB — BASIC METABOLIC PANEL
CO2: 29 mEq/L (ref 19–32)
Chloride: 104 mEq/L (ref 96–112)
Creatinine, Ser: 1.2 mg/dL (ref 0.4–1.5)
Potassium: 5.2 mEq/L — ABNORMAL HIGH (ref 3.5–5.1)

## 2012-08-05 MED ORDER — SIMVASTATIN 40 MG PO TABS: 40.0000 mg | ORAL_TABLET | Freq: Every day | ORAL | Status: DC

## 2012-08-05 MED ORDER — LISINOPRIL-HYDROCHLOROTHIAZIDE 10-12.5 MG PO TABS: 1.0000 | ORAL_TABLET | Freq: Every day | ORAL | Status: DC

## 2012-08-05 MED ORDER — MELOXICAM 15 MG PO TABS: 15.0000 mg | ORAL_TABLET | Freq: Every day | ORAL | Status: DC

## 2012-08-05 NOTE — Patient Instructions (Signed)
Continue current medications  Set up a time next week for removal of the lesion on your scalp

## 2012-08-05 NOTE — Progress Notes (Signed)
  Subjective:    Patient ID: Paul Lowe, male    DOB: 12-Jul-1941, 71 y.o.   MRN: [id number]  HPI Paul Lowe is a delightful 71 year old married male nonsmoker who comes in today for a Medicare wellness examination  He takes Zestoretic 10-12.5 daily for hypertension BP 110/70  He takes Mobic 15 mg daily for arthritis  He also takes Niaspan for hyper triglyceridemia and Zocor 40 mg daily for hyperlipidemia.  He states he feels well and no complaints except history of bronchitis neck the will not heal after 3 months.  He gets routine eye care, dental care, colonoscopy and GI, vaccinations up-to-date  Cognitive function normal he walks on a regular basis home health safety reviewed no issues identified, no guns in the house, he does have a health care power of attorney and living well   Review of Systems  Constitutional: Negative.   HENT: Negative.   Eyes: Negative.   Respiratory: Negative.   Cardiovascular: Negative.   Gastrointestinal: Negative.   Genitourinary: Negative.   Musculoskeletal: Negative.   Skin: Negative.   Neurological: Negative.   Hematological: Negative.   Psychiatric/Behavioral: Negative.        Objective:   Physical Exam  Constitutional: He is oriented to person, place, and time. He appears well-developed and well-nourished.  HENT:  Head: Normocephalic and atraumatic.  Right Ear: External ear normal.  Left Ear: External ear normal.  Nose: Nose normal.  Mouth/Throat: Oropharynx is clear and moist.  Eyes: Conjunctivae normal and EOM are normal. Pupils are equal, round, and reactive to light.  Neck: Normal range of motion. Neck supple. No JVD present. No tracheal deviation present. No thyromegaly present.  Cardiovascular: Normal rate, regular rhythm, normal heart sounds and intact distal pulses.  Exam reveals no gallop and no friction rub.   No murmur heard. Pulmonary/Chest: Effort normal and breath sounds normal. No stridor. No respiratory distress. He  has no wheezes. He has no rales. He exhibits no tenderness.  Abdominal: Soft. Bowel sounds are normal. He exhibits no distension and no mass. There is no tenderness. There is no rebound and no guarding.  Genitourinary: Rectum normal, prostate normal and penis normal. Guaiac negative stool. No penile tenderness.  Musculoskeletal: Normal range of motion. He exhibits no edema and no tenderness.  Lymphadenopathy:    He has no cervical adenopathy.  Neurological: He is alert and oriented to person, place, and time. He has normal reflexes. No cranial nerve deficit. He exhibits normal muscle tone.  Skin: Skin is warm and dry. No rash noted. No erythema. No pallor.       Total body skin exam normal except for a scab nonhealing lesion posterior scalp advised to return for removal clinically appears to be a basal cell carcinoma  Psychiatric: He has a normal mood and affect. His behavior is normal. Judgment and thought content normal.          Assessment & Plan:  Healthy male  Hypertension at goal continue current therapy  Allergic rhinitis continue over-the-counter Claritin  Osteoarthritis continue Mobic 15 mg daily  Hyperlipidemia continue Niaspan and simvastatin  Nonhealing lesion scalp return for removal

## 2012-08-07 LAB — TSH: TSH: 0.92 u[IU]/mL (ref 0.35–5.50)

## 2012-08-07 LAB — PSA: PSA: 1.46 ng/mL (ref 0.10–4.00)

## 2012-08-12 ENCOUNTER — Ambulatory Visit (INDEPENDENT_AMBULATORY_CARE_PROVIDER_SITE_OTHER): Payer: Medicare Other | Admitting: Family Medicine

## 2012-08-12 ENCOUNTER — Encounter: Payer: Self-pay | Admitting: Family Medicine

## 2012-08-12 DIAGNOSIS — L820 Inflamed seborrheic keratosis: Secondary | ICD-10-CM

## 2012-08-12 DIAGNOSIS — H6120 Impacted cerumen, unspecified ear: Secondary | ICD-10-CM

## 2012-08-12 NOTE — Patient Instructions (Signed)
Within 2 weeks we will call you the path report

## 2012-08-12 NOTE — Progress Notes (Signed)
  Subjective:    Patient ID: Paul Lowe, male    DOB: 09-22-1941, 71 y.o.   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 The base was cauterized the lesion was sent for pathologic analysis. He tolerated the procedure no complications.  Then it Fleet Contras went ahead and flushed out both ears has a lot of ear wax   Review of Systems General and dermatologic review of systems otherwise negative    Objective:   Physical Exam Procedure see above       Assessment & Plan:  It appears to be an inflamed seborrheic keratosis path pending

## 2012-08-12 NOTE — Addendum Note (Signed)
Addended by: Kern Reap B on: 08/12/2012 01:39 PM   Modules accepted: Orders

## 2012-08-16 ENCOUNTER — Other Ambulatory Visit: Payer: Self-pay | Admitting: Family Medicine

## 2012-10-10 ENCOUNTER — Encounter: Payer: Self-pay | Admitting: Family Medicine

## 2012-10-10 ENCOUNTER — Ambulatory Visit (INDEPENDENT_AMBULATORY_CARE_PROVIDER_SITE_OTHER): Payer: Medicare Other | Admitting: Family Medicine

## 2012-10-10 VITALS — BP 110/80 | HR 80 | Temp 98.2°F | Wt 192.0 lb

## 2012-10-10 DIAGNOSIS — L723 Sebaceous cyst: Secondary | ICD-10-CM

## 2012-10-10 NOTE — Patient Instructions (Addendum)
Epidermal Cyst An epidermal cyst is sometimes called a sebaceous cyst, epidermal inclusion cyst, or infundibular cyst. These cysts usually contain a substance that looks "pasty" or "cheesy" and may have a bad smell. This substance is a protein called keratin. Epidermal cysts are usually found on the face, neck, or trunk. They may also occur in the vaginal area or other parts of the genitalia of both men and women. Epidermal cysts are usually small, painless, slow-growing bumps or lumps that move freely under the skin. It is important not to try to pop them. This may cause an infection and lead to tenderness and swelling. CAUSES  Epidermal cysts may be caused by a deep penetrating injury to the skin or a plugged hair follicle, often associated with acne. SYMPTOMS  Epidermal cysts can become inflamed and cause:  Redness.  Tenderness.  Increased temperature of the skin over the bumps or lumps.  Grayish-white, bad smelling material that drains from the bump or lump. DIAGNOSIS  Epidermal cysts are easily diagnosed by your caregiver during an exam. Rarely, a tissue sample (biopsy) may be taken to rule out other conditions that may resemble epidermal cysts. TREATMENT   Epidermal cysts often get better and disappear on their own. They are rarely ever cancerous.  If a cyst becomes infected, it may become inflamed and tender. Treatment with antibiotics may be necessary. When the infection is gone, the cyst may be removed with minor surgery.  Small, inflamed cysts can often be treated with antibiotics or by injecting steroid medicines.  Sometimes, epidermal cysts become large and bothersome. If this happens, surgical removal in your caregiver's office may be necessary. HOME CARE INSTRUCTIONS  Only take over-the-counter or prescription medicines as directed by your caregiver.  Take your antibiotics as directed. Finish them even if you start to feel better. SEEK MEDICAL CARE IF:   Your cyst  becomes tender, red, or swollen.  Your condition is not improving or is getting worse.  You have any other questions or concerns. MAKE SURE YOU:  Understand these instructions.  Will watch your condition.  Will get help right away if you are not doing well or get worse. Document Released: 09/01/2004 Document Revised: 12/24/2011 Document Reviewed: 04/09/2011 [facility] Patient Information 2013 [place], [place].

## 2012-10-10 NOTE — Progress Notes (Signed)
Chief Complaint  Patient presents with  . bump on back    HPI:  Acute visit for bump: -noticed: last week maybe, has flared up before and then went away -symptoms: bump on back, not really too painful -has tried: nothing -RU:EAVW on back -denies: fevers, chills  ROS: See pertinent positives and negatives per HPI.  Past Medical History  Diagnosis Date  . Allergic rhinitis   . Hyperlipidemia   . Osteoarthritis   . Adult acne     Family History  Problem Relation Age of Onset  . Stroke Mother     History   Social History  . Marital Status: Married    Spouse Name: N/A    Number of Children: N/A  . Years of Education: N/A   Social History Main Topics  . Smoking status: Never Smoker   . Smokeless tobacco: None  . Alcohol Use: No  . Drug Use: No  . Sexually Active:    Other Topics Concern  . None   Social History Narrative  . None    Current outpatient prescriptions:amoxicillin (AMOXIL) 500 MG capsule, Take 500 mg by mouth. Before dental procedure, Disp: , Rfl: ;  Ascorbic Acid (VITAMIN C) 100 MG tablet, Take 100 mg by mouth daily.  , Disp: , Rfl: ;  aspirin 81 MG tablet, Take 81 mg by mouth daily.  , Disp: , Rfl: ;  fish oil-omega-3 fatty acids 1000 MG capsule, Take 2 g by mouth daily., Disp: , Rfl:  lisinopril-hydrochlorothiazide (PRINZIDE,ZESTORETIC) 10-12.5 MG per tablet, Take 1 tablet by mouth daily., Disp: 90 tablet, Rfl: 3;  loratadine (CLARITIN) 10 MG tablet, Take 10 mg by mouth daily.  , Disp: , Rfl: ;  meloxicam (MOBIC) 15 MG tablet, Take 1 tablet (15 mg total) by mouth daily., Disp: 100 tablet, Rfl: 3;  MULTIPLE VITAMIN PO, Take by mouth.  , Disp: , Rfl: ;  NIASPAN 500 MG CR tablet, TAKE DAILY AS DIRECTED, Disp: 100 tablet, Rfl: 3 simvastatin (ZOCOR) 40 MG tablet, Take 1 tablet (40 mg total) by mouth at bedtime., Disp: 100 tablet, Rfl: 3  EXAM:  Filed Vitals:   10/10/12 1354  BP: 110/80  Pulse: 80  Temp: 98.2 F (36.8 C)    There is no height on  file to calculate BMI.  GENERAL: vitals reviewed and listed above, alert, oriented, appears well hydrated and in no acute distress  HEENT: atraumatic, conjunttiva clear, no obvious abnormalities on inspection of external nose and ears  SKIN: mobile, soft cyst with central punctate - mildly erythematous mid back around T6-T7  MS: moves all extremities without noticeable abnormality  PSYCH: pleasant and cooperative, no obvious depression or anxiety  ASSESSMENT AND PLAN:  Discussed the following assessment and plan:  1. Inflamed epidermoid cyst of skin    -advised likley not bacterial infection and tx options including - IandD, abx, inj, versus compresses and observation -opted for observation with compresses - will follow up with PCP if worsening or not resolving and may consider cyst removal once inflammation resolves -Patient advised to return or notify a doctor immediately if symptoms worsen or persist or new concerns arise.  There are no Patient Instructions on file for this visit.   Kriste Basque R.

## 2012-10-28 ENCOUNTER — Encounter: Payer: Self-pay | Admitting: Family Medicine

## 2012-10-28 ENCOUNTER — Ambulatory Visit (INDEPENDENT_AMBULATORY_CARE_PROVIDER_SITE_OTHER): Payer: Medicare Other | Admitting: Family Medicine

## 2012-10-28 VITALS — BP 110/80 | Temp 98.6°F | Wt 192.0 lb

## 2012-10-28 DIAGNOSIS — L089 Local infection of the skin and subcutaneous tissue, unspecified: Secondary | ICD-10-CM | POA: Insufficient documentation

## 2012-10-28 DIAGNOSIS — L723 Sebaceous cyst: Secondary | ICD-10-CM

## 2012-10-28 NOTE — Progress Notes (Signed)
  Subjective:    Patient ID: Paul Lowe, male    DOB: [birth year], 72 y.o.   MRN: [id number]  HPI Paul Lowe is a 72 year old married male nonsmoker who comes in today for removal of a sebaceous cyst that is increasing in size on his back      Review of Systems    review of systems negative Objective:   Physical Exam Well-developed well-nourished male in no acute distress examination of back shows a 15 x 15 mm raised red sebaceous cyst  After informed consent he was taken to the treatment room and the lesion was anesthetized with 1% Xylocaine with epinephrine. It was prepped with Betadine. An incision was made about 20 cc of pussy fluid was drained. It was packed and dry sterile dressing was applied. He tolerated the procedure well no complications       Assessment & Plan:  Infected sebaceous cyst drained surgically return in 48 hours to remove packing

## 2012-10-28 NOTE — Patient Instructions (Signed)
Leave the dressing clean and dry  Return on Thursday for followup

## 2012-10-30 ENCOUNTER — Ambulatory Visit (INDEPENDENT_AMBULATORY_CARE_PROVIDER_SITE_OTHER): Payer: Medicare Other | Admitting: Family Medicine

## 2012-10-30 DIAGNOSIS — L723 Sebaceous cyst: Secondary | ICD-10-CM

## 2012-10-30 NOTE — Patient Instructions (Signed)
Cleaned the pocket twice daily with a Q-tip until it completely heals return when necessary

## 2012-10-30 NOTE — Progress Notes (Signed)
  Subjective:    Patient ID: Paul Lowe, male    DOB: 1941/01/18, 71 y.o.   MRN: [id number]  HPI Paul Lowe is a 72 year old male who comes in to remove the packing from  an infected sebaceous cyst in his back  We excised an infected sebaceous cyst on his back on Monday. He comes in today for followup.   Review of Systems    review of systems negative Objective:   Physical Exam  Well-developed well-nourished male in no acute distress the dressing was removed the packing was removed he was instructed how to do cleaning with a Q-tip twice daily      Assessment & Plan:  Infected sebaceous cyst resolving return when necessary

## 2012-12-01 ENCOUNTER — Encounter: Payer: Self-pay | Admitting: Family Medicine

## 2013-07-03 ENCOUNTER — Ambulatory Visit (INDEPENDENT_AMBULATORY_CARE_PROVIDER_SITE_OTHER): Payer: Medicare Other | Admitting: *Deleted

## 2013-07-03 DIAGNOSIS — Z23 Encounter for immunization: Secondary | ICD-10-CM

## 2013-09-17 ENCOUNTER — Other Ambulatory Visit: Payer: Self-pay | Admitting: Family Medicine

## 2013-09-29 ENCOUNTER — Encounter: Payer: Self-pay | Admitting: Family Medicine

## 2013-09-29 ENCOUNTER — Ambulatory Visit (INDEPENDENT_AMBULATORY_CARE_PROVIDER_SITE_OTHER): Payer: Medicare Other | Admitting: Family Medicine

## 2013-09-29 VITALS — BP 130/80 | Temp 98.2°F | Ht 70.0 in | Wt 187.0 lb

## 2013-09-29 DIAGNOSIS — Z23 Encounter for immunization: Secondary | ICD-10-CM

## 2013-09-29 DIAGNOSIS — Z0000 Encounter for general adult medical examination without abnormal findings: Secondary | ICD-10-CM

## 2013-09-29 DIAGNOSIS — L719 Rosacea, unspecified: Secondary | ICD-10-CM

## 2013-09-29 DIAGNOSIS — J309 Allergic rhinitis, unspecified: Secondary | ICD-10-CM

## 2013-09-29 DIAGNOSIS — M1990 Unspecified osteoarthritis, unspecified site: Secondary | ICD-10-CM

## 2013-09-29 DIAGNOSIS — IMO0002 Reserved for concepts with insufficient information to code with codable children: Secondary | ICD-10-CM

## 2013-09-29 DIAGNOSIS — E785 Hyperlipidemia, unspecified: Secondary | ICD-10-CM

## 2013-09-29 DIAGNOSIS — N401 Enlarged prostate with lower urinary tract symptoms: Secondary | ICD-10-CM

## 2013-09-29 LAB — HEPATIC FUNCTION PANEL
ALT: 26 U/L (ref 0–53)
AST: 30 U/L (ref 0–37)
Albumin: 4.3 g/dL (ref 3.5–5.2)
Alkaline Phosphatase: 82 U/L (ref 39–117)
Total Bilirubin: 1 mg/dL (ref 0.3–1.2)
Total Protein: 7 g/dL (ref 6.0–8.3)

## 2013-09-29 LAB — BASIC METABOLIC PANEL
BUN: 14 mg/dL (ref 6–23)
CO2: 26 mEq/L (ref 19–32)
Chloride: 104 mEq/L (ref 96–112)
GFR: 74.85 mL/min (ref >60.00)
Glucose, Bld: 78 mg/dL (ref 70–99)
Potassium: 5.1 mEq/L (ref 3.5–5.1)
Sodium: 137 mEq/L (ref 135–145)

## 2013-09-29 LAB — POCT URINALYSIS DIPSTICK
Bilirubin, UA: NEGATIVE
Blood, UA: NEGATIVE
Glucose, UA: NEGATIVE
Ketones, UA: NEGATIVE
Nitrite, UA: NEGATIVE
Protein, UA: NEGATIVE
Spec Grav, UA: 1.02
pH, UA: 7.5

## 2013-09-29 LAB — CBC WITH DIFFERENTIAL/PLATELET
Basophils Absolute: 0 10*3/uL (ref 0.0–0.1)
Basophils Relative: 0.5 % (ref 0.0–3.0)
HCT: 48.4 % (ref 39.0–52.0)
Hemoglobin: 16.6 g/dL (ref 13.0–17.0)
Lymphocytes Relative: 22.6 % (ref 12.0–46.0)
Lymphs Abs: 2.1 10*3/uL (ref 0.7–4.0)
Monocytes Absolute: 0.6 10*3/uL (ref 0.1–1.0)
Monocytes Relative: 6.6 % (ref 3.0–12.0)
Neutro Abs: 6.2 10*3/uL (ref 1.4–7.7)
Neutrophils Relative %: 66.4 % (ref 43.0–77.0)
RBC: 5.08 Mil/uL (ref 4.22–5.81)
RDW: 12.4 % (ref 11.5–14.6)

## 2013-09-29 LAB — LIPID PANEL
Cholesterol: 144 mg/dL (ref 0–200)
HDL: 33.5 mg/dL — ABNORMAL LOW (ref >39.00)
LDL Cholesterol: 82 mg/dL (ref 0–99)
Total CHOL/HDL Ratio: 4
Triglycerides: 145 mg/dL (ref 0.0–149.0)
VLDL: 29 mg/dL (ref 0.0–40.0)

## 2013-09-29 LAB — PSA: PSA: 1.69 ng/mL (ref 0.10–4.00)

## 2013-09-29 MED ORDER — SIMVASTATIN 40 MG PO TABS: 40.0000 mg | ORAL_TABLET | Freq: Every day | ORAL | Status: DC

## 2013-09-29 MED ORDER — LISINOPRIL-HYDROCHLOROTHIAZIDE 10-12.5 MG PO TABS: 1.0000 | ORAL_TABLET | Freq: Every day | ORAL | Status: DC

## 2013-09-29 NOTE — Progress Notes (Signed)
Pre visit review using our clinic review tool, if applicable. No additional management support is needed unless otherwise documented below in the visit note. 

## 2013-09-29 NOTE — Progress Notes (Signed)
   Subjective:    Patient ID: Paul Lowe, male    DOB: [birth year], 72 y.o.   MRN: [id number]  HPI Paul Lowe is a 72 year old married male nonsmoker who comes in today for a Medicare wellness examination because of a history of hypertension, allergic rhinitis, osteoarthritis, hyperlipidemia  His med list reviewed the been no changes except he stopped the Niaspan. Also recommend he stop the Mobic and use Motrin 400 twice a day  He gets routine eye care, dental care, colonoscopy every 10 years, vaccinations up-to-date Pneumovax given today  Cognitive function normal he walks on a regular basis home health safety reviewed no issues identified, no guns in the house, he does have a health care power of attorney and living well.   Review of Systems  Constitutional: Negative.   HENT: Negative.   Eyes: Negative.   Respiratory: Negative.   Cardiovascular: Negative.   Gastrointestinal: Negative.   Genitourinary: Negative.   Musculoskeletal: Negative.   Skin: Negative.   Allergic/Immunologic: Negative.   Neurological: Negative.   Hematological: Negative.   Psychiatric/Behavioral: Negative.        Objective:   Physical Exam  Nursing note and vitals reviewed. Constitutional: He is oriented to person, place, and time. He appears well-developed and well-nourished.  HENT:  Head: Normocephalic and atraumatic.  Right Ear: External ear normal.  Left Ear: External ear normal.  Nose: Nose normal.  Mouth/Throat: Oropharynx is clear and moist.  Eyes: Conjunctivae and EOM are normal. Pupils are equal, round, and reactive to light.  Neck: Normal range of motion. Neck supple. No JVD present. No tracheal deviation present. No thyromegaly present.  Cardiovascular: Normal rate, regular rhythm, normal heart sounds and intact distal pulses.  Exam reveals no gallop and no friction rub.   No murmur heard. No carotid or bruits peripheral pulses 2+ and symmetrical  Pulmonary/Chest: Effort normal and  breath sounds normal. No stridor. No respiratory distress. He has no wheezes. He has no rales. He exhibits no tenderness.  Abdominal: Soft. Bowel sounds are normal. He exhibits no distension and no mass. There is no tenderness. There is no rebound and no guarding.  Genitourinary: Rectum normal, prostate normal and penis normal. Guaiac negative stool. No penile tenderness.  Uncircumcised male  Musculoskeletal: Normal range of motion. He exhibits no edema and no tenderness.  Lymphadenopathy:    He has no cervical adenopathy.  Neurological: He is alert and oriented to person, place, and time. He has normal reflexes. No cranial nerve deficit. He exhibits normal muscle tone.  Skin: Skin is warm and dry. No rash noted. No erythema. No pallor.  Total body skin exam normal except for scars right left knee from previous knee replacements  Psychiatric: He has a normal mood and affect. His behavior is normal. Judgment and thought content normal.          Assessment & Plan:  Healthy male  Hypertension at goal continue Zestoretic  Allergic rhinitis continue Claritin  Osteoarthritis Motrin 400 twice a day  Hyperlipidemia Zocor 40 mg daily along with 1 aspirin tablet daily. Status post bilateral knee replacement continue exercise program

## 2013-09-29 NOTE — Patient Instructions (Signed)
Continue current medications  We will call you and a couple days and he gets her lab work back  Remember to walk 30 minutes daily  Mobic discontinue  Motrin 400 mg twice daily with food  Return in one year sooner if any problems

## 2013-10-14 ENCOUNTER — Other Ambulatory Visit: Payer: Self-pay | Admitting: Family Medicine

## 2014-07-09 ENCOUNTER — Ambulatory Visit (INDEPENDENT_AMBULATORY_CARE_PROVIDER_SITE_OTHER): Payer: Commercial Managed Care - HMO

## 2014-07-09 DIAGNOSIS — Z23 Encounter for immunization: Secondary | ICD-10-CM

## 2014-09-30 ENCOUNTER — Telehealth: Payer: Self-pay | Admitting: Family Medicine

## 2014-09-30 ENCOUNTER — Encounter: Payer: Self-pay | Admitting: Family Medicine

## 2014-09-30 ENCOUNTER — Ambulatory Visit (INDEPENDENT_AMBULATORY_CARE_PROVIDER_SITE_OTHER): Payer: Commercial Managed Care - HMO | Admitting: Family Medicine

## 2014-09-30 VITALS — BP 110/80 | Temp 98.1°F | Ht 70.0 in | Wt 186.0 lb

## 2014-09-30 DIAGNOSIS — Z23 Encounter for immunization: Secondary | ICD-10-CM

## 2014-09-30 DIAGNOSIS — E785 Hyperlipidemia, unspecified: Secondary | ICD-10-CM

## 2014-09-30 DIAGNOSIS — R351 Nocturia: Secondary | ICD-10-CM

## 2014-09-30 DIAGNOSIS — N401 Enlarged prostate with lower urinary tract symptoms: Secondary | ICD-10-CM

## 2014-09-30 DIAGNOSIS — I10 Essential (primary) hypertension: Secondary | ICD-10-CM

## 2014-09-30 LAB — POCT URINALYSIS DIPSTICK
BILIRUBIN UA: NEGATIVE
Blood, UA: NEGATIVE
Glucose, UA: NEGATIVE
Ketones, UA: NEGATIVE
LEUKOCYTES UA: NEGATIVE
NITRITE UA: NEGATIVE
PROTEIN UA: NEGATIVE
Spec Grav, UA: 1.015
Urobilinogen, UA: 0.2
pH, UA: 5.5

## 2014-09-30 LAB — PSA: PSA: 2.5 ng/mL (ref 0.10–4.00)

## 2014-09-30 LAB — CBC WITH DIFFERENTIAL/PLATELET
BASOS ABS: 0 10*3/uL (ref 0.0–0.1)
Basophils Relative: 0.5 % (ref 0.0–3.0)
Eosinophils Absolute: 0.3 10*3/uL (ref 0.0–0.7)
Eosinophils Relative: 3.7 % (ref 0.0–5.0)
HEMATOCRIT: 48.5 % (ref 39.0–52.0)
Hemoglobin: 16.4 g/dL (ref 13.0–17.0)
Lymphocytes Relative: 23.3 % (ref 12.0–46.0)
Lymphs Abs: 2.2 10*3/uL (ref 0.7–4.0)
MCHC: 33.9 g/dL (ref 30.0–36.0)
MCV: 96.5 fl (ref 78.0–100.0)
MONOS PCT: 8.4 % (ref 3.0–12.0)
Monocytes Absolute: 0.8 10*3/uL (ref 0.1–1.0)
NEUTROS PCT: 64.1 % (ref 43.0–77.0)
Neutro Abs: 5.9 10*3/uL (ref 1.4–7.7)
PLATELETS: 217 10*3/uL (ref 150.0–400.0)
RBC: 5.03 Mil/uL (ref 4.22–5.81)
RDW: 12.8 % (ref 11.5–15.5)
WBC: 9.3 10*3/uL (ref 4.0–10.5)

## 2014-09-30 LAB — BASIC METABOLIC PANEL
BUN: 28 mg/dL — AB (ref 6–23)
CO2: 25 mEq/L (ref 19–32)
CREATININE: 1.5 mg/dL (ref 0.4–1.5)
Calcium: 11.4 mg/dL — ABNORMAL HIGH (ref 8.4–10.5)
Chloride: 103 mEq/L (ref 96–112)
GFR: 48.17 mL/min — AB (ref >60.00)
Glucose, Bld: 93 mg/dL (ref 70–99)
POTASSIUM: 5.5 meq/L — AB (ref 3.5–5.1)
Sodium: 136 mEq/L (ref 135–145)

## 2014-09-30 LAB — LIPID PANEL
CHOLESTEROL: 134 mg/dL (ref 0–200)
HDL: 30.7 mg/dL — ABNORMAL LOW (ref >39.00)
LDL CALC: 80 mg/dL (ref 0–99)
NonHDL: 103.3
TRIGLYCERIDES: 116 mg/dL (ref 0.0–149.0)
Total CHOL/HDL Ratio: 4
VLDL: 23.2 mg/dL (ref 0.0–40.0)

## 2014-09-30 LAB — TSH: TSH: 0.96 u[IU]/mL (ref 0.35–4.50)

## 2014-09-30 LAB — HEPATIC FUNCTION PANEL
ALT: 21 U/L (ref 0–53)
AST: 23 U/L (ref 0–37)
Albumin: 4.1 g/dL (ref 3.5–5.2)
Alkaline Phosphatase: 78 U/L (ref 39–117)
Bilirubin, Direct: 0.2 mg/dL (ref 0.0–0.3)
TOTAL PROTEIN: 7 g/dL (ref 6.0–8.3)
Total Bilirubin: 1 mg/dL (ref 0.2–1.2)

## 2014-09-30 MED ORDER — LISINOPRIL-HYDROCHLOROTHIAZIDE 10-12.5 MG PO TABS: 1.0000 | ORAL_TABLET | Freq: Every day | ORAL | Status: DC

## 2014-09-30 MED ORDER — SIMVASTATIN 40 MG PO TABS: 40.0000 mg | ORAL_TABLET | Freq: Every day | ORAL | Status: DC

## 2014-09-30 NOTE — Telephone Encounter (Signed)
Pt needs refill on generic mobic 15 mg #90 w/refills call into [facility] [address]

## 2014-09-30 NOTE — Progress Notes (Signed)
Pre visit review using our clinic review tool, if applicable. No additional management support is needed unless otherwise documented below in the visit note. 

## 2014-09-30 NOTE — Progress Notes (Signed)
   Subjective:    Patient ID: Paul Lowe, male    DOB: [birth year], 73 y.o.   MRN: [id number]  HPI Paul Lowe is a delightful 73 year old married male nonsmoker who comes in today for evaluation of hypertension hyperlipidemia  He takes Zestoretic 10-12 0.5 daily for hypertension BP 110/80  He takes Zocor 40 mg daily at bedtime with an aspirin tablet for hyperlipidemia. 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   HENT: Negative.   Eyes: Negative.   Respiratory: Negative.   Cardiovascular: Negative.   Gastrointestinal: Negative.   Endocrine: Negative.   Genitourinary: Negative.   Musculoskeletal: Negative.   Skin: Negative.   Allergic/Immunologic: Negative.   Neurological: Negative.   Hematological: Negative.   Psychiatric/Behavioral: Negative.        Objective:   Physical Exam  Constitutional: He is oriented to person, place, and time. He appears well-developed and well-nourished.  HENT:  Head: Normocephalic and atraumatic.  Right Ear: External ear normal.  Left Ear: External ear normal.  Nose: Nose normal.  Mouth/Throat: Oropharynx is clear and moist.  Eyes: Conjunctivae and EOM are normal. Pupils are equal, round, and reactive to light.  Neck: Normal range of motion. Neck supple. No JVD present. No tracheal deviation present. No thyromegaly present.  Cardiovascular: Normal rate, regular rhythm, normal heart sounds and intact distal pulses.  Exam reveals no gallop and no friction rub.   No murmur heard. No carotid nor aortic bruits  Pulmonary/Chest: Effort normal and breath sounds normal. No  stridor. No respiratory distress. He has no wheezes. He has no rales. He exhibits no tenderness.  Abdominal: Soft. Bowel sounds are normal. He exhibits no distension and no mass. There is no tenderness. There is no rebound and no guarding.  Genitourinary: Rectum normal and penis normal. Guaiac negative stool. No penile tenderness.  2+ symmetrical BPH with nocturia. No family history of prostate cancer  Musculoskeletal: Normal range of motion. He exhibits no edema or tenderness.  Lymphadenopathy:    He has no cervical adenopathy.  Neurological: He is alert and oriented to person, place, and time. He has normal reflexes. No cranial nerve deficit. He exhibits normal muscle tone.  Skin: Skin is warm and dry. No rash noted. No erythema. No pallor.  Psychiatric: He has a normal mood and affect. His behavior is normal. Judgment and thought content normal.  Nursing note and vitals reviewed.         Assessment & Plan:  Hypertension ago continue current therapy  Hyperlipidemia continue current therapy.... Check labs  BPH with nocturia .... Check PSA ..............Marland Kitchen

## 2014-09-30 NOTE — Patient Instructions (Signed)
Continue current medications  Follow-up in one year sooner if any problems  Labs today

## 2014-10-01 ENCOUNTER — Telehealth: Payer: Self-pay | Admitting: Family Medicine

## 2014-10-01 ENCOUNTER — Other Ambulatory Visit: Payer: Self-pay | Admitting: Family Medicine

## 2014-10-01 DIAGNOSIS — R899 Unspecified abnormal finding in specimens from other organs, systems and tissues: Secondary | ICD-10-CM

## 2014-10-01 MED ORDER — MELOXICAM 15 MG PO TABS: 15.0000 mg | ORAL_TABLET | Freq: Every day | ORAL | Status: DC

## 2014-10-01 NOTE — Telephone Encounter (Signed)
emmi emailed °

## 2014-10-04 ENCOUNTER — Telehealth: Payer: Self-pay | Admitting: Family Medicine

## 2014-10-04 NOTE — Telephone Encounter (Signed)
Lm on vm to cb °

## 2014-10-04 NOTE — Telephone Encounter (Signed)
Patient is to come in a week after new years for lab appointment only.  Please call and schedule patient.

## 2014-10-04 NOTE — Telephone Encounter (Signed)
Pt instructed to fu w/ lab work due to previous labs not what the doc wants to see.  Orders are in. Do you want me to sched pt for these labs and then a FUP? If so, how soon does pt need thes labs redone? Or do just labs only? Pls advise

## 2014-10-20 ENCOUNTER — Other Ambulatory Visit (INDEPENDENT_AMBULATORY_CARE_PROVIDER_SITE_OTHER): Payer: Commercial Managed Care - HMO

## 2014-10-20 DIAGNOSIS — R899 Unspecified abnormal finding in specimens from other organs, systems and tissues: Secondary | ICD-10-CM

## 2014-10-20 LAB — BASIC METABOLIC PANEL
BUN: 20 mg/dL (ref 6–23)
CO2: 28 mEq/L (ref 19–32)
CREATININE: 1.2 mg/dL (ref 0.4–1.5)
Calcium: 11.3 mg/dL — ABNORMAL HIGH (ref 8.4–10.5)
Chloride: 106 mEq/L (ref 96–112)
GFR: 60.92 mL/min (ref >60.00)
Glucose, Bld: 87 mg/dL (ref 70–99)
POTASSIUM: 4.5 meq/L (ref 3.5–5.1)
Sodium: 139 mEq/L (ref 135–145)

## 2014-10-20 LAB — CALCIUM: Calcium: 11.3 mg/dL — ABNORMAL HIGH (ref 8.4–10.5)

## 2014-10-21 LAB — PTH, INTACT AND CALCIUM
Calcium: 11.2 mg/dL — ABNORMAL HIGH (ref 8.4–10.5)
PTH: 116 pg/mL — AB (ref 14–64)

## 2014-10-26 ENCOUNTER — Other Ambulatory Visit: Payer: Self-pay | Admitting: Family Medicine

## 2014-10-26 DIAGNOSIS — R7989 Other specified abnormal findings of blood chemistry: Secondary | ICD-10-CM

## 2014-11-05 ENCOUNTER — Ambulatory Visit: Payer: Commercial Managed Care - HMO | Admitting: Internal Medicine

## 2014-11-12 ENCOUNTER — Ambulatory Visit (INDEPENDENT_AMBULATORY_CARE_PROVIDER_SITE_OTHER): Payer: Commercial Managed Care - HMO | Admitting: Internal Medicine

## 2014-11-12 ENCOUNTER — Encounter: Payer: Self-pay | Admitting: Internal Medicine

## 2014-11-12 LAB — MAGNESIUM: MAGNESIUM: 2.1 mg/dL (ref 1.5–2.5)

## 2014-11-12 LAB — PHOSPHORUS: Phosphorus: 2.4 mg/dL (ref 2.3–4.6)

## 2014-11-12 LAB — VITAMIN D 25 HYDROXY (VIT D DEFICIENCY, FRACTURES): VITD: 37.96 ng/mL (ref 30.00–100.00)

## 2014-11-12 MED ORDER — FUROSEMIDE 20 MG PO TABS: 20.0000 mg | ORAL_TABLET | Freq: Every day | ORAL | Status: DC

## 2014-11-12 MED ORDER — MULTIPLE VITAMIN PO TABS: ORAL_TABLET | ORAL | Status: AC

## 2014-11-12 MED ORDER — LISINOPRIL 10 MG PO TABS
10.0000 mg | ORAL_TABLET | Freq: Every day | ORAL | Status: DC
Start: 2014-11-12 — End: 2015-02-08

## 2014-11-12 NOTE — Patient Instructions (Addendum)
Please stop the combination of lisinopril-HCTZ and start lisinopril 10 mg and furosemide 20 mg daily separately.   Please return in 1 month for repeat parathyroid hormone and calcium level, after stopping HCTZ.  Please stop at the lab.  Please do not start the urine collection until we make sure that your vitamin D is normal. Patient information (Up-to-Date): Collection of a 24-hour urine specimen  - You should collect every drop of urine during each 24-hour period. It does not matter how much or little urine is passed each time, as long as every drop is collected. - Begin the urine collection in the morning after you wake up, after you have emptied your bladder for the first time. - Urinate (empty the bladder) for the first time and flush it down the toilet. Note the exact time (eg, 6:15 AM). You will begin the urine collection at this time. - Collect every drop of urine during the day and night in an empty collection bottle. Store the bottle at room temperature or in the refrigerator. - If you need to have a bowel movement, any urine passed with the bowel movement should be collected. Try not to include feces with the urine collection. If feces does get mixed in, do not try to remove the feces from the urine collection bottle. - Finish by collecting the first urine passed the next morning, adding it to the collection bottle. This should be within ten minutes before or after the time of the first morning void on the first day (which was flushed). In this example, you would try to void between 6:05 and 6:25 on the second day. - If you need to urinate one hour before the final collection time, drink a full glass of water so that you can void again at the appropriate time. If you have to urinate 20 minutes before, try to hold the urine until the proper time. - Please note the exact time of the final collection, even if it is not the same time as when collection began on day 1. - The bottle(s) may be  kept at room temperature for a day or two, but should be kept cool or refrigerated for longer periods of time.  Please come back for another visit in 4 months.  Will be in touch about the results of the tests.

## 2014-11-12 NOTE — Progress Notes (Signed)
Patient ID: Paul Lowe, male   DOB: 09-03-1941, 74 y.o.   MRN: [id number]   Paul  IAAN Lowe is a 74 y.o.-year-old male, referred by Paul Lowe, Dr. Sherren Mocha, for evaluation for hypercalcemia/hyperparathyroidism.  Pt was dx with hypercalcemia in 2011 per review of chart. I reviewed pt's pertinent labs: Lab Results  Component Value Date   PTH 116* 10/20/2014   CALCIUM 11.3* 10/20/2014   CALCIUM 11.3* 10/20/2014   CALCIUM 11.2* 10/20/2014   CALCIUM 11.4* 09/30/2014   CALCIUM 11.0* 09/29/2013   CALCIUM 11.0* 08/05/2012   CALCIUM 10.3 06/01/2010   CALCIUM 9.8 11/02/2009   CALCIUM 9.4 11/01/2009   CALCIUM 10.8* 10/25/2009   No fractures or falls.   No h/o kidney stones.  He has a h/o CKD stage 2-3. Last BUN/Cr: Lab Results  Component Value Date   BUN 20 10/20/2014   CREATININE 1.2 10/20/2014   Pt is on HCTZ 12.5 mg.   No h/o vitamin D deficiency. No vitamin D level available for review.  Pt is on calcium and vitamin D (in MVI); he also eats dairy but not a lot of green, leafy, vegetables.  Pt does not have a FH of hypercalcemia, pituitary tumors, thyroid cancer, or osteoporosis.   I reviewed Paul chart and he also has a history of HL, HTN, BPH.   ROS: Constitutional: no weight gain/loss, no fatigue, no subjective hyperthermia/hypothermia Eyes: no blurry vision, no xerophthalmia ENT: no sore throat, no nodules palpated in throat, no dysphagia/odynophagia, no hoarseness Cardiovascular: no CP/SOB/palpitations/leg swelling Respiratory: no cough/SOB Gastrointestinal: no N/V/D/C/+ occasional acid reflux Musculoskeletal: no muscle/joint aches Skin: no rashes Neurological: no tremors/numbness/tingling/dizziness Psychiatric: no depression/anxiety  Past Medical History  Diagnosis Date  . Allergic rhinitis   . Hyperlipidemia   . Osteoarthritis   . Adult acne    Past Surgical History  Procedure Laterality Date  . Appendectomy    . Anal cyst    . Cataract extraction,  bilateral     History   Social History  . Marital Status: Married    Spouse Name: N/A    Number of Children: 0   Occupational History  . Retired professor, now part-time    Social History Main Topics  . Smoking status: Never Smoker   . Smokeless tobacco: Not on file  . Alcohol Use: Wine, 2-3x a week, 1-2 drinks  . Drug Use: No  Exercise: tennis, more in summer  Current Outpatient Prescriptions on File Prior to Visit  Medication Sig Dispense Refill  . Ascorbic Acid (VITAMIN C) 100 MG tablet Take 100 mg by mouth daily.      Marland Kitchen aspirin 81 MG tablet Take 81 mg by mouth daily.      . fish oil-omega-3 fatty acids 1000 MG capsule Take 2 g by mouth daily.    Marland Kitchen lisinopril-hydrochlorothiazide (PRINZIDE,ZESTORETIC) 10-12.5 MG per tablet Take 1 tablet by mouth daily. 90 tablet 3  . loratadine (CLARITIN) 10 MG tablet Take 10 mg by mouth daily.      . meloxicam (MOBIC) 15 MG tablet Take 1 tablet (15 mg total) by mouth daily. 90 tablet 3  . simvastatin (ZOCOR) 40 MG tablet Take 1 tablet (40 mg total) by mouth at bedtime. 100 tablet 3   No current facility-administered medications on file prior to visit.   Allergies  Allergen Reactions  . Shrimp Flavor     REACTION: rash   Family History  Problem Relation Age of Onset  . Stroke Mother    PE:  BP 122/68 mmHg  Pulse 74  Temp(Src) 98.1 F (36.7 C) (Oral)  Resp 12  Ht 5\' 9"  (1.753 m)  Wt 189 lb 12.8 oz (86.093 kg)  BMI 28.02 kg/m2  SpO2 97% Wt Readings from Last 3 Encounters:  11/12/14 189 lb 12.8 oz (86.093 kg)  09/30/14 186 lb (84.369 kg)  09/29/13 187 lb (84.823 kg)   Constitutional: overweight, in NAD. No kyphosis. Eyes: PERRLA, EOMI, no exophthalmos ENT: moist mucous membranes, no thyromegaly, no cervical lymphadenopathy Cardiovascular: RRR, No MRG Respiratory: CTA B Gastrointestinal: abdomen soft, NT, ND, BS+ Musculoskeletal: no deformities, strength intact in all 4 Skin: moist, warm, no rashes Neurological: no tremor  with outstretched hands, DTR normal in all 4  Assessment: 1. Hypercalcemia/hyperparathyroidism  Plan: Patient has had several instances of elevated calcium, with the highest level being at 11.4. An intact PTH level was also high, at 116 (at that time, Ca 11.3).  It is unclear whether he has vitamin D deficiency, however, it is very likely that he does have a parathyroid adenoma based on the high PTH level with a borderline/high calcium. He is also on HCTZ that can elevate calcium, but I don't think it would elevate it to 11.3-11.4. No apparent complications from hypercalcemia: no h/o nephrolithiasis, no osteoporosis, no fractures. No abdominal pain, depression, bone pain. - I discussed with the patient about the physiology of calcium and parathyroid hormone, and possible side effects from increased PTH, including kidney stones, osteoporosis, abdominal pain, etc.  - We discussed that we need to check whether Paul hyperparathyroidism is primary (Familial hypercalcemic hypocalciuria, FHH, or parathyroid adenoma) or secondary (to conditions like: vitamin D deficiency, calcium malabsorption, hypercalciuria, renal insufficiency, etc.). - we discussed about criteria for parathyroid surgery:  Increased calcium by more than 1 mg/dL above the upper limit of normal  Kidney ds.  Osteoporosis (or Vb fx) Age <26 years old New (2013): High UCa >400 mg/d and increased stone risk by biochemical stone risk analysis Presence of nephrolithiasis or nephrocalcinosis Pt's preference!  - he does not meet the criteria as of now, but Paul GFR is very close to threshold. If Paul w/up points towards [facility], I would suggest surgical removal. He agrees to plan. - we may need to check a DEXA scan to see if he has osteoporosis. I would add a 33% distal radius for evaluation of cortical bone.  - I advised him to stop HCTZ 12.5 mg and change to Lasix 20 mg (sent to pharmacy) >> he needs to return for another calcium and PTH in 1  mo. - However, today we will check: calcium level intact PTH ([facility]) Magnesium Phosphorus vitamin D- 25 HO and 1,25 HO  24h urinary calcium/creatinine ratio - if vit D normal - pt given instructions for urine collection and the jug - If the tests indicate a parathyroid adenoma, I will refer him to surgery. Possible consequences of hyperparathyroidism: ~1/3 pts will develop complications over 15 years (OP, nephrolithiasis). I will wait for the results of the above labs and will discuss with the plan with the patient.   - time spent with the patient: 1 hour, of which >50% was spent in obtaining information about Paul symptoms, reviewing previous labs, evaluations, and treatments, counseling him about her condition (please see the discussed topics above), and developing a plan to further investigate and treat it. He had a number of questions which I addressed.  Return in about 4 months (around 03/13/2015).  Component     Latest Ref  Rng 11/12/2014 11/12/2014        10:09 AM 10:09 AM  Calcium     8.6 - 10.2 mg/dL  10.8 (H)  PTH     15 - 65 pg/mL  75 (H)  Vitamin D 1, 25 (OH) Total     18 - 72 pg/mL 34   Vitamin D3 1, 25 (OH)      34   Vitamin D2 1, 25 (OH)      <8   Magnesium     1.5 - 2.5 mg/dL 2.1   Phosphorus     2.3 - 4.6 mg/dL 2.4   VITD     30.00 - 100.00 ng/mL 37.96    Normal vitamin D >> will advised to do the urine collection next weekend (by that time, the HCTZ hypocalciuric effect will be gone, most likely).  Mg normal (Unlikely [place]). Phos at the LLN. PTH high, Ca high. 1,25 diHO vit D normal. >> most likely Primary HPTH. Awaiting urine collection.  Component     Latest Ref Rng 11/22/2014  Calcium, Ur      11  Calcium, 24 hour urine     100 - 250 mg/day 143  Creatinine, Urine      97.4  Creatinine, 24H Ur     800 - 2000 mg/day 1267  UCa normal. FECa ~1.  Repeat Ca + PTH off HCTZ still abnormal: Component     Latest Ref Rng 12/13/2014          Calcium      8.6 - 10.2 mg/dL 10.7 (H)  PTH     15 - 65 pg/mL 71 (H)   Suspect Primary HPTH. Will refer to surgery.

## 2014-11-13 LAB — PTH, INTACT AND CALCIUM
CALCIUM: 10.8 mg/dL — AB (ref 8.6–10.2)
PTH: 75 pg/mL — AB (ref 15–65)

## 2014-11-15 LAB — VITAMIN D 1,25 DIHYDROXY
VITAMIN D 1, 25 (OH) TOTAL: 34 pg/mL (ref 18–72)
VITAMIN D3 1, 25 (OH): 34 pg/mL
Vitamin D2 1, 25 (OH)2: <8 pg/mL

## 2014-11-16 ENCOUNTER — Ambulatory Visit (INDEPENDENT_AMBULATORY_CARE_PROVIDER_SITE_OTHER): Payer: Commercial Managed Care - HMO | Admitting: Family Medicine

## 2014-11-16 ENCOUNTER — Encounter: Payer: Self-pay | Admitting: Family Medicine

## 2014-11-16 VITALS — BP 110/70 | Temp 98.4°F | Wt 189.0 lb

## 2014-11-16 DIAGNOSIS — H918X3 Other specified hearing loss, bilateral: Secondary | ICD-10-CM

## 2014-11-16 DIAGNOSIS — H9193 Unspecified hearing loss, bilateral: Secondary | ICD-10-CM

## 2014-11-16 NOTE — Progress Notes (Signed)
   Subjective:    Patient ID: Paul Lowe, male    DOB: 08-14-1941, 74 y.o.   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 He therefore is been using the ear drops to try to soften the earwax.   Review of Systems Review of systems otherwise negative    Objective:   Physical Exam  Well-developed well-nourished male no acute distress vital signs stable is afebrile examination ears has bilateral cerumen impactions. With irrigation the ear wax was removed by Paul Lowe:  Bilateral hearing loss secondary to cerumen impactions removed by irrigation

## 2014-11-16 NOTE — Progress Notes (Signed)
Pre visit review using our clinic review tool, if applicable. No additional management support is needed unless otherwise documented below in the visit note. 

## 2014-11-16 NOTE — Patient Instructions (Signed)
Return when necessary 

## 2014-11-22 ENCOUNTER — Other Ambulatory Visit: Payer: Commercial Managed Care - HMO

## 2014-11-23 LAB — CREATININE, URINE, 24 HOUR
CREATININE 24H UR: 1267 mg/d (ref 800–2000)
Creatinine, Urine: 97.4 mg/dL

## 2014-11-23 LAB — CALCIUM, URINE, 24 HOUR
CALCIUM 24HR UR: 143 mg/d (ref 100–250)
CALCIUM UR: 11 mg/dL

## 2014-12-01 ENCOUNTER — Telehealth: Payer: Self-pay | Admitting: Internal Medicine

## 2014-12-01 NOTE — Telephone Encounter (Signed)
Called pt and lvm advising him that the [facility], Ophelia Charter is the one who handles referrals and scheduling. She does this for 6 dr's at her office and the 3 at ours. She will contact him as soon as possible.

## 2014-12-01 NOTE — Telephone Encounter (Signed)
Patient called stating that he does not have a referral sent to Dr. Harlow Asa  He has called Dr. Tera Helper office this week and nothing   Please advise patient   May leave detailed message on home phone     Thank you

## 2014-12-13 ENCOUNTER — Other Ambulatory Visit: Payer: Self-pay | Admitting: *Deleted

## 2014-12-13 ENCOUNTER — Other Ambulatory Visit: Payer: Commercial Managed Care - HMO

## 2014-12-13 ENCOUNTER — Other Ambulatory Visit: Payer: Self-pay | Admitting: Internal Medicine

## 2014-12-14 LAB — CALCIUM: Calcium: 10.7 mg/dL — ABNORMAL HIGH (ref 8.6–10.2)

## 2014-12-14 LAB — PARATHYROID HORMONE, INTACT (NO CA): PTH: 71 pg/mL — ABNORMAL HIGH (ref 15–65)

## 2014-12-31 ENCOUNTER — Other Ambulatory Visit: Payer: Self-pay | Admitting: Surgery

## 2014-12-31 DIAGNOSIS — E213 Hyperparathyroidism, unspecified: Secondary | ICD-10-CM

## 2015-01-03 ENCOUNTER — Other Ambulatory Visit: Payer: Self-pay | Admitting: Surgery

## 2015-01-04 ENCOUNTER — Other Ambulatory Visit: Payer: Self-pay | Admitting: Surgery

## 2015-01-04 DIAGNOSIS — E213 Hyperparathyroidism, unspecified: Secondary | ICD-10-CM

## 2015-01-17 ENCOUNTER — Other Ambulatory Visit: Payer: Self-pay | Admitting: Surgery

## 2015-01-17 ENCOUNTER — Encounter (HOSPITAL_COMMUNITY)
Admission: RE | Admit: 2015-01-17 | Discharge: 2015-01-17 | Disposition: A | Discharge status: Home / Self Care | Payer: Commercial Managed Care - HMO | Source: Ambulatory Visit | Attending: Surgery | Admitting: Surgery

## 2015-01-17 DIAGNOSIS — D351 Benign neoplasm of parathyroid gland: Secondary | ICD-10-CM | POA: Insufficient documentation

## 2015-01-17 DIAGNOSIS — E211 Secondary hyperparathyroidism, not elsewhere classified: Secondary | ICD-10-CM | POA: Diagnosis not present

## 2015-01-17 DIAGNOSIS — E213 Hyperparathyroidism, unspecified: Secondary | ICD-10-CM | POA: Diagnosis present

## 2015-01-17 MED ORDER — TECHNETIUM TC 99M SESTAMIBI GENERIC - CARDIOLITE
25.0000 | Freq: Once | INTRAVENOUS | Status: AC | PRN
  Administered 2015-01-17: 25 via INTRAVENOUS

## 2015-01-24 ENCOUNTER — Ambulatory Visit: Payer: Self-pay | Admitting: Surgery

## 2015-01-27 ENCOUNTER — Encounter: Payer: Self-pay | Admitting: Family Medicine

## 2015-01-27 ENCOUNTER — Ambulatory Visit (INDEPENDENT_AMBULATORY_CARE_PROVIDER_SITE_OTHER): Payer: Commercial Managed Care - HMO | Admitting: Family Medicine

## 2015-01-27 VITALS — BP 106/78 | Temp 98.1°F | Wt 188.5 lb

## 2015-01-27 DIAGNOSIS — M6740 Ganglion, unspecified site: Secondary | ICD-10-CM

## 2015-01-27 DIAGNOSIS — L089 Local infection of the skin and subcutaneous tissue, unspecified: Secondary | ICD-10-CM

## 2015-01-27 DIAGNOSIS — L723 Sebaceous cyst: Secondary | ICD-10-CM

## 2015-01-27 NOTE — Progress Notes (Signed)
   Subjective:    Patient ID: [name], male    DOB: [birth year], 74 y.o.   MRN: [id number]  HPI Guillermo is a 74 year old married male nonsmoker who comes in today for evaluation of 3 issues  He noticed a swollen painful lesion on his back that had a pustule on it. With warm soaks and pressure drained it. It's in the same area that he had an infected sebaceous cyst before  He also had a lesion on his left hand. It started out as a subungual hematoma got infected he drained spontaneously  The third lesion is a bump on his right i ring finger.    Review of Systems Review of systems negative    Objective:   Physical Exam  Well-developed well-nourished male no acute distress vital signs stable he is afebrile examination of back shows a draining infected sebaceous cyst. With an alcohol prep the sac was removed. Dry sterile dressing was applied  The lesion on his left hand was a subungual hematoma that it looks like is resolving spontaneously.  The lesion on his right ring finger appears to be a mucoid cyst. He was taken the treatment room and after sterile prep with alcohol and Betadine the area was infiltrated with 1% Xylocaine with epinephrine. It was unroofed and cleaned the sac was cleaned out. Dry sterile dressing was applied      Assessment & Plan:  Sebaceous cyst on the back.......... drained spontaneously  Subungual hematoma left hand....... drained spontaneously  Mucoid cyst right ring finger...........Marland Kitchen mucoid cyst excised as noted above.

## 2015-01-27 NOTE — Patient Instructions (Signed)
Tomorrow do twice daily dressing changes on the lesion removed from her finger. After Monday leave it open to the air  Clean the area on your back twice daily with peroxide and apply antibiotic ointment and a Band-Aid until it heals  Return when necessary

## 2015-01-27 NOTE — Progress Notes (Signed)
Pre visit review using our clinic review tool, if applicable. No additional management support is needed unless otherwise documented below in the visit note. 

## 2015-02-08 ENCOUNTER — Telehealth: Payer: Self-pay | Admitting: Internal Medicine

## 2015-02-08 MED ORDER — LISINOPRIL 10 MG PO TABS: 10.0000 mg | ORAL_TABLET | Freq: Every day | ORAL | Status: DC

## 2015-02-08 MED ORDER — FUROSEMIDE 20 MG PO TABS: 20.0000 mg | ORAL_TABLET | Freq: Every day | ORAL | Status: DC

## 2015-02-08 NOTE — Telephone Encounter (Signed)
Done

## 2015-02-08 NOTE — Telephone Encounter (Signed)
Patient need a refill of lisinopril 10 mg and another b/p medication but don't know the name of the medication, please advise

## 2015-02-26 NOTE — Pre-Procedure Instructions (Signed)
Paul Lowe  02/26/2015   Your procedure is scheduled on:  May 26  Report to [facility] Admitting at 05:30 AM.  Call this number if you have problems the morning of surgery: [phone]   Remember:   Do not eat food or drink liquids after midnight.   Take these medicines the morning of surgery with A SIP OF WATER: Claritin,   STOP Vitamin C, Aspirin, Fish Oil, Meloxicam, Multiple Vitamin May 19   STOP/ Do not take Aspirin, Aleve, Naproxen, Advil, Ibuprofen, Motrin, Vitamins, Herbs, or Supplements starting May 19    Do not wear jewelry, make-up or nail polish.  Do not wear lotions, powders, or perfumes. You may wear deodorant.  Do not shave 48 hours prior to surgery. Men may shave face and neck.  Do not bring valuables to the hospital.  [facility] is not responsible for any belongings or valuables.               Contacts, dentures or bridgework may not be worn into surgery.  Leave suitcase in the car. After surgery it may be brought to your room.  For patients admitted to the hospital, discharge time is determined by your treatment team.               Special Instructions: [place] - Preparing for Surgery  Before surgery, you can play an important role.  Because skin is not sterile, your skin needs to be as free of germs as possible.  You can reduce the number of germs on you skin by washing with CHG (chlorahexidine gluconate) soap before surgery.  CHG is an antiseptic cleaner which kills germs and bonds with the skin to continue killing germs even after washing.  Please DO NOT use if you have an allergy to CHG or antibacterial soaps.  If your skin becomes reddened/irritated stop using the CHG and inform your nurse when you arrive at Short Stay.  Do not shave (including legs and underarms) for at least 48 hours prior to the first CHG shower.  You may shave your face.  Please follow these instructions carefully:   1.  Shower with CHG Soap the night before  surgery and the morning of Surgery.  2.  If you choose to wash your hair, wash your hair first as usual with your normal shampoo.  3.  After you shampoo, rinse your hair and body thoroughly to remove the shampoo.  4.  Use CHG as you would any other liquid soap.  You can apply CHG directly to the skin and wash gently with scrungie or a clean washcloth.  5.  Apply the CHG Soap to your body ONLY FROM THE NECK DOWN.  Do not use on open wounds or open sores.  Avoid contact with your eyes, ears, mouth and genitals (private parts).  Wash genitals (private parts) with your normal soap.  6.  Wash thoroughly, paying special attention to the area where your surgery will be performed.  7.  Thoroughly rinse your body with warm water from the neck down.  8.  DO NOT shower/wash with your normal soap after using and rinsing off the CHG Soap.  9.  Pat yourself dry with a clean towel.            10.  Wear clean pajamas.            11.  Place clean sheets on your bed the night of your first shower and do not sleep  with pets.  Day of Surgery  Do not apply any lotions the morning of surgery.  Please wear clean clothes to the hospital/surgery center.     Please read over the following fact sheets that you were given: Pain Booklet, Coughing and Deep Breathing and Surgical Site Infection Prevention

## 2015-02-28 ENCOUNTER — Other Ambulatory Visit (HOSPITAL_COMMUNITY): Payer: Self-pay

## 2015-02-28 ENCOUNTER — Encounter (HOSPITAL_COMMUNITY): Payer: Self-pay

## 2015-02-28 ENCOUNTER — Encounter (HOSPITAL_COMMUNITY)
Admission: RE | Admit: 2015-02-28 | Discharge: 2015-02-28 | Disposition: A | Discharge status: Home / Self Care | Payer: Commercial Managed Care - HMO | Source: Ambulatory Visit | Attending: Surgery | Admitting: Surgery

## 2015-02-28 DIAGNOSIS — E210 Primary hyperparathyroidism: Secondary | ICD-10-CM | POA: Insufficient documentation

## 2015-02-28 DIAGNOSIS — Z01812 Encounter for preprocedural laboratory examination: Secondary | ICD-10-CM | POA: Diagnosis not present

## 2015-02-28 HISTORY — DX: Essential (primary) hypertension: I10

## 2015-02-28 LAB — BASIC METABOLIC PANEL
ANION GAP: 7 (ref 5–15)
BUN: 21 mg/dL — ABNORMAL HIGH (ref 6–20)
CALCIUM: 10.8 mg/dL — AB (ref 8.9–10.3)
CHLORIDE: 108 mmol/L (ref 101–111)
CO2: 26 mmol/L (ref 22–32)
Creatinine, Ser: 1.21 mg/dL (ref 0.61–1.24)
GFR calc Af Amer: >60 mL/min (ref >60)
GFR calc non Af Amer: 58 mL/min — ABNORMAL LOW (ref >60)
GLUCOSE: 103 mg/dL — AB (ref 65–99)
POTASSIUM: 4.4 mmol/L (ref 3.5–5.1)
SODIUM: 141 mmol/L (ref 135–145)

## 2015-02-28 LAB — CBC
HCT: 44.3 % (ref 39.0–52.0)
Hemoglobin: 15.5 g/dL (ref 13.0–17.0)
MCH: 32.5 pg (ref 26.0–34.0)
MCHC: 35 g/dL (ref 30.0–36.0)
MCV: 92.9 fL (ref 78.0–100.0)
Platelets: 191 10*3/uL (ref 150–400)
RBC: 4.77 MIL/uL (ref 4.22–5.81)
RDW: 12.3 % (ref 11.5–15.5)
WBC: 7 10*3/uL (ref 4.0–10.5)

## 2015-03-08 NOTE — Progress Notes (Signed)
Quick Note:  These results are acceptable for scheduled surgery.  Deondra Wigger M. Reva Pinkley, MD, FACS [facility] Office: [phone]   ______ 

## 2015-03-09 ENCOUNTER — Encounter (HOSPITAL_COMMUNITY): Payer: Self-pay | Admitting: Surgery

## 2015-03-09 DIAGNOSIS — E210 Primary hyperparathyroidism: Secondary | ICD-10-CM | POA: Diagnosis present

## 2015-03-09 MED ORDER — CEFAZOLIN SODIUM-DEXTROSE 2-3 GM-% IV SOLR
2.0000 g | INTRAVENOUS | Status: AC
  Administered 2015-03-10: 2 g via INTRAVENOUS
  Filled 2015-03-09: qty 50

## 2015-03-09 NOTE — H&P (Signed)
General Surgery [facility]  Krzysztof L. Daoust DOB: 1941-09-23 Married / Language: English / Race: White Male  History of Present Illness  The patient is a 74 year old male who presents with a parathyroid neoplasm. Patient is referred by Dr. Stevie Kern and Dr. Philemon Kingdom for evaluation of primary hyperparathyroidism. Patient was noted on routine laboratory studies to have an elevated calcium level. This is been slowly rising since 2011. Calcium levels currently range from 10.7-11.3. Intact PTH level was measured and was elevated as high as 116. Recent level remains elevated at 71. Patient underwent a 24-hour urine collection for calcium which was normal at 143. Patient has not had any significant complications. He denies nephrolithiasis. He has not had a bone density scan but has had no recent fractures. He does note chronic fatigue. Patient has not had any prior head or neck surgery. There is no family history of parathyroid disease and no family history of endocrine neoplasms. Patient has not had any imaging studies performed.   Other Problems Arthritis Asthma High blood pressure Hypercholesterolemia  Past Surgical History Appendectomy Cataract Surgery Bilateral. Knee Surgery Bilateral.  Diagnostic Studies History Colonoscopy 1-5 years ago  Allergies No Known Drug Allergies  Medication History Aspirin EC (81MG  Tablet DR, Oral) Active. Simvastatin (40MG  Tablet, Oral) Active. Furosemide (20MG  Tablet, Oral) Active. Lisinopril (10MG  Tablet, Oral) Active. Meloxicam (15MG  Tablet, Oral) Active. Loratadine (10MG  Tablet, Oral) Active. Complete Multi-Vitamin (Oral) Active. Medications Reconciled  Social History Alcohol use Occasional alcohol use. Caffeine use Carbonated beverages, Coffee. No drug use Tobacco use Never smoker.  Family History  Alcohol Abuse Father. Cerebrovascular Accident Mother. Hypertension  Father.  Review of Systems General Not Present- Appetite Loss, Chills, Fatigue, Fever, Night Sweats, Weight Gain and Weight Loss. Skin Present- Dryness. Not Present- Change in Wart/Mole, Hives, Jaundice, New Lesions, Non-Healing Wounds, Rash and Ulcer. HEENT Present- Seasonal Allergies. Not Present- Earache, Hearing Loss, Hoarseness, Nose Bleed, Oral Ulcers, Ringing in the Ears, Sinus Pain, Sore Throat, Visual Disturbances, Wears glasses/contact lenses and Yellow Eyes. Respiratory Not Present- Bloody sputum, Chronic Cough, Difficulty Breathing, Snoring and Wheezing. Breast Not Present- Breast Mass, Breast Pain, Nipple Discharge and Skin Changes. Cardiovascular Not Present- Chest Pain, Difficulty Breathing Lying Down, Leg Cramps, Palpitations, Rapid Heart Rate, Shortness of Breath and Swelling of Extremities. Gastrointestinal Not Present- Abdominal Pain, Bloating, Bloody Stool, Change in Bowel Habits, Chronic diarrhea, Constipation, Difficulty Swallowing, Excessive gas, Gets full quickly at meals, Hemorrhoids, Indigestion, Nausea, Rectal Pain and Vomiting. Male Genitourinary Not Present- Blood in Urine, Change in Urinary Stream, Frequency, Impotence, Nocturia, Painful Urination, Urgency and Urine Leakage. Musculoskeletal Not Present- Back Pain, Joint Pain, Joint Stiffness, Muscle Pain, Muscle Weakness and Swelling of Extremities. Neurological Not Present- Decreased Memory, Fainting, Headaches, Numbness, Seizures, Tingling, Tremor, Trouble walking and Weakness. Psychiatric Not Present- Anxiety, Bipolar, Change in Sleep Pattern, Depression, Fearful and Frequent crying. Endocrine Not Present- Cold Intolerance, Excessive Hunger, Hair Changes, Heat Intolerance, Hot flashes and New Diabetes. Hematology Present- Gland problems. Not Present- Easy Bruising, Excessive bleeding, HIV and Persistent Infections.   Vitals Weight: 186.5 lb Height: 70in Body Surface Area: 2.04 m Body Mass Index: 26.76  kg/m Temp.: 97.78F  Pulse: 66 (Regular)  BP: 126/80 (Sitting, Left Arm, Standard)    Physical Exam  General - appears comfortable, no distress; not diaphorectic  HEENT - normocephalic; sclerae clear, gaze conjugate; mucous membranes moist, dentition good; voice normal  Neck - symmetric on extension; no palpable anterior or posterior cervical adenopathy; no palpable  masses in the thyroid bed  Chest - clear bilaterally with rhonchi, rales, or wheeze  Cor - regular rhythm with normal rate; no significant murmur  Ext - non-tender without significant edema or lymphedema  Neuro - grossly intact; mild tremor    Assessment & Plan PRIMARY HYPERPARATHYROIDISM (252.01  E21.0)  Patient likely has primary hyperparathyroidism. I provided he and his wife with written literature on this subject to review at home.  I have recommended proceeding with a nuclear medicine parathyroid scan in hopes of localizing a parathyroid adenoma and confirming his diagnosis. We discussed the study. We discussed primary hyperparathyroidism and its surgical management. We discussed the options for surgery including minimally invasive surgery versus 4 gland exploration. We discussed the potential need for additional studies including high resolution ultrasound of the neck. Patient and his wife understand and wish to proceed.  We will arrange for the nuclear medicine parathyroid scan and I will contact him with the results. We will then decide on next steps in management at that point in time.  Nuclear med scan completed and shows left inferior adenoma.  Will proceed with minimally invasive resection.  The risks and benefits of the procedure have been discussed at length with the patient.  The patient understands the proposed procedure, potential alternative treatments, and the course of recovery to be expected.  All of the patient's questions have been answered at this time.  The patient wishes to proceed with  surgery.  Earnstine Regal, MD, [facility] Office: [phone]

## 2015-03-10 ENCOUNTER — Encounter (HOSPITAL_COMMUNITY): Payer: Self-pay | Admitting: *Deleted

## 2015-03-10 ENCOUNTER — Ambulatory Visit (HOSPITAL_COMMUNITY): Payer: Commercial Managed Care - HMO | Admitting: Anesthesiology

## 2015-03-10 ENCOUNTER — Ambulatory Visit (HOSPITAL_COMMUNITY)
Admission: RE | Admit: 2015-03-10 | Discharge: 2015-03-10 | Disposition: A | Discharge status: Home / Self Care | Payer: Commercial Managed Care - HMO | Source: Ambulatory Visit | Attending: Surgery | Admitting: Surgery

## 2015-03-10 ENCOUNTER — Telehealth: Payer: Self-pay | Admitting: Internal Medicine

## 2015-03-10 ENCOUNTER — Encounter (HOSPITAL_COMMUNITY)
Admission: RE | Disposition: A | Discharge status: Home / Self Care | Payer: Self-pay | Source: Ambulatory Visit | Attending: Surgery

## 2015-03-10 DIAGNOSIS — E210 Primary hyperparathyroidism: Secondary | ICD-10-CM | POA: Diagnosis present

## 2015-03-10 DIAGNOSIS — Z7982 Long term (current) use of aspirin: Secondary | ICD-10-CM | POA: Diagnosis not present

## 2015-03-10 DIAGNOSIS — I10 Essential (primary) hypertension: Secondary | ICD-10-CM | POA: Diagnosis not present

## 2015-03-10 DIAGNOSIS — M1990 Unspecified osteoarthritis, unspecified site: Secondary | ICD-10-CM | POA: Diagnosis not present

## 2015-03-10 DIAGNOSIS — E780 Pure hypercholesterolemia: Secondary | ICD-10-CM | POA: Insufficient documentation

## 2015-03-10 DIAGNOSIS — Z79899 Other long term (current) drug therapy: Secondary | ICD-10-CM | POA: Insufficient documentation

## 2015-03-10 DIAGNOSIS — J45909 Unspecified asthma, uncomplicated: Secondary | ICD-10-CM | POA: Insufficient documentation

## 2015-03-10 HISTORY — PX: PARATHYROIDECTOMY: SHX19

## 2015-03-10 SURGERY — PARATHYROIDECTOMY
Anesthesia: General | Site: Neck

## 2015-03-10 MED ORDER — PHENYLEPHRINE 40 MCG/ML (10ML) SYRINGE FOR IV PUSH (FOR BLOOD PRESSURE SUPPORT)
PREFILLED_SYRINGE | INTRAVENOUS | Status: AC
  Filled 2015-03-10: qty 10

## 2015-03-10 MED ORDER — DEXAMETHASONE SODIUM PHOSPHATE 4 MG/ML IJ SOLN
INTRAMUSCULAR | Status: AC
  Filled 2015-03-10: qty 2

## 2015-03-10 MED ORDER — NEOSTIGMINE METHYLSULFATE 10 MG/10ML IV SOLN
INTRAVENOUS | Status: DC | PRN
  Administered 2015-03-10: 4 mg via INTRAVENOUS

## 2015-03-10 MED ORDER — ROCURONIUM BROMIDE 50 MG/5ML IV SOLN
INTRAVENOUS | Status: AC
  Filled 2015-03-10: qty 1

## 2015-03-10 MED ORDER — DEXTROSE 5 % IV SOLN
INTRAVENOUS | Status: DC | PRN
  Administered 2015-03-10: 08:00:00 via INTRAVENOUS

## 2015-03-10 MED ORDER — OXYCODONE HCL 5 MG PO TABS: 5.0000 mg | ORAL_TABLET | ORAL | Status: DC | PRN

## 2015-03-10 MED ORDER — MIDAZOLAM HCL 2 MG/2ML IJ SOLN
INTRAMUSCULAR | Status: AC
  Filled 2015-03-10: qty 2

## 2015-03-10 MED ORDER — BUPIVACAINE HCL (PF) 0.25 % IJ SOLN
INTRAMUSCULAR | Status: AC
  Filled 2015-03-10: qty 30

## 2015-03-10 MED ORDER — LIDOCAINE HCL (CARDIAC) 20 MG/ML IV SOLN
INTRAVENOUS | Status: AC
  Filled 2015-03-10: qty 5

## 2015-03-10 MED ORDER — ARTIFICIAL TEARS OP OINT
TOPICAL_OINTMENT | OPHTHALMIC | Status: AC
  Filled 2015-03-10: qty 3.5

## 2015-03-10 MED ORDER — FENTANYL CITRATE (PF) 250 MCG/5ML IJ SOLN
INTRAMUSCULAR | Status: AC
  Filled 2015-03-10: qty 5

## 2015-03-10 MED ORDER — GLYCOPYRROLATE 0.2 MG/ML IJ SOLN
INTRAMUSCULAR | Status: DC | PRN
  Administered 2015-03-10: 0.6 mg via INTRAVENOUS

## 2015-03-10 MED ORDER — FENTANYL CITRATE (PF) 100 MCG/2ML IJ SOLN
INTRAMUSCULAR | Status: DC | PRN
  Administered 2015-03-10: 100 ug via INTRAVENOUS
  Administered 2015-03-10 (×2): 25 ug via INTRAVENOUS

## 2015-03-10 MED ORDER — NEOSTIGMINE METHYLSULFATE 10 MG/10ML IV SOLN
INTRAVENOUS | Status: AC
  Filled 2015-03-10: qty 1

## 2015-03-10 MED ORDER — OXYCODONE HCL 5 MG PO TABS
5.0000 mg | ORAL_TABLET | Freq: Once | ORAL | Status: AC | PRN
Start: 2015-03-10 — End: 2015-03-10
  Administered 2015-03-10: 5 mg via ORAL

## 2015-03-10 MED ORDER — ONDANSETRON HCL 4 MG/2ML IJ SOLN
INTRAMUSCULAR | Status: AC
  Filled 2015-03-10: qty 2

## 2015-03-10 MED ORDER — ONDANSETRON HCL 4 MG/2ML IJ SOLN: 4.0000 mg | Freq: Once | INTRAMUSCULAR | Status: DC | PRN

## 2015-03-10 MED ORDER — BUPIVACAINE HCL (PF) 0.25 % IJ SOLN
INTRAMUSCULAR | Status: DC | PRN
  Administered 2015-03-10: 10 mL

## 2015-03-10 MED ORDER — GLYCOPYRROLATE 0.2 MG/ML IJ SOLN
INTRAMUSCULAR | Status: AC
  Filled 2015-03-10: qty 3

## 2015-03-10 MED ORDER — MIDAZOLAM HCL 5 MG/5ML IJ SOLN
INTRAMUSCULAR | Status: DC | PRN
  Administered 2015-03-10 (×3): 0.5 mg via INTRAVENOUS

## 2015-03-10 MED ORDER — PROPOFOL 10 MG/ML IV BOLUS
INTRAVENOUS | Status: AC
  Filled 2015-03-10: qty 20

## 2015-03-10 MED ORDER — LACTATED RINGERS IV SOLN
INTRAVENOUS | Status: DC | PRN
  Administered 2015-03-10 (×2): via INTRAVENOUS

## 2015-03-10 MED ORDER — OXYCODONE HCL 5 MG PO TABS
ORAL_TABLET | ORAL | Status: AC
  Filled 2015-03-10: qty 1

## 2015-03-10 MED ORDER — ONDANSETRON HCL 4 MG/2ML IJ SOLN
INTRAMUSCULAR | Status: DC | PRN
  Administered 2015-03-10: 4 mg via INTRAVENOUS

## 2015-03-10 MED ORDER — OXYCODONE HCL 5 MG/5ML PO SOLN: 5.0000 mg | Freq: Once | ORAL | Status: AC | PRN

## 2015-03-10 MED ORDER — 0.9 % SODIUM CHLORIDE (POUR BTL) OPTIME
TOPICAL | Status: DC | PRN
  Administered 2015-03-10: 1000 mL

## 2015-03-10 MED ORDER — DEXAMETHASONE SODIUM PHOSPHATE 4 MG/ML IJ SOLN
INTRAMUSCULAR | Status: DC | PRN
  Administered 2015-03-10: 8 mg via INTRAVENOUS

## 2015-03-10 MED ORDER — HEMOSTATIC AGENTS (NO CHARGE) OPTIME
TOPICAL | Status: DC | PRN
  Administered 2015-03-10: 1

## 2015-03-10 MED ORDER — PHENYLEPHRINE HCL 10 MG/ML IJ SOLN
INTRAMUSCULAR | Status: DC | PRN
  Administered 2015-03-10 (×2): 40 ug via INTRAVENOUS
  Administered 2015-03-10: 80 ug via INTRAVENOUS
  Administered 2015-03-10: 40 ug via INTRAVENOUS

## 2015-03-10 MED ORDER — HYDROMORPHONE HCL 1 MG/ML IJ SOLN: 0.2500 mg | INTRAMUSCULAR | Status: DC | PRN

## 2015-03-10 MED ORDER — ARTIFICIAL TEARS OP OINT
TOPICAL_OINTMENT | OPHTHALMIC | Status: DC | PRN
  Administered 2015-03-10: 1 via OPHTHALMIC

## 2015-03-10 SURGICAL SUPPLY — 55 items
APL SKNCLS STERI-STRIP NONHPOA (GAUZE/BANDAGES/DRESSINGS) ×1
ATTRACTOMAT 16X20 MAGNETIC DRP (DRAPES) ×3 IMPLANT
BENZOIN TINCTURE PRP APPL 2/3 (GAUZE/BANDAGES/DRESSINGS) ×3 IMPLANT
BLADE SURG 10 STRL SS (BLADE) ×3 IMPLANT
BLADE SURG 15 STRL LF DISP TIS (BLADE) ×1 IMPLANT
BLADE SURG 15 STRL SS (BLADE) ×3
CANISTER SUCTION 2500CC (MISCELLANEOUS) ×3 IMPLANT
CHLORAPREP W/TINT 10.5 ML (MISCELLANEOUS) ×3 IMPLANT
CLIP TI MEDIUM 6 (CLIP) ×3 IMPLANT
CLIP TI WIDE RED SMALL 6 (CLIP) ×3 IMPLANT
CLOSURE WOUND 1/2 X4 (GAUZE/BANDAGES/DRESSINGS) ×1
CONT SPEC 4OZ CLIKSEAL STRL BL (MISCELLANEOUS) ×3 IMPLANT
COVER SURGICAL LIGHT HANDLE (MISCELLANEOUS) ×3 IMPLANT
DECANTER SPIKE VIAL GLASS SM (MISCELLANEOUS) ×2 IMPLANT
DRAPE PED LAPAROTOMY (DRAPES) ×3 IMPLANT
DRAPE UTILITY XL STRL (DRAPES) ×4 IMPLANT
ELECT CAUTERY BLADE 6.4 (BLADE) ×3 IMPLANT
ELECT REM PT RETURN 9FT ADLT (ELECTROSURGICAL) ×3
ELECTRODE REM PT RTRN 9FT ADLT (ELECTROSURGICAL) ×1 IMPLANT
GAUZE SPONGE 2X2 8PLY STRL LF (GAUZE/BANDAGES/DRESSINGS) ×1 IMPLANT
GAUZE SPONGE 4X4 16PLY XRAY LF (GAUZE/BANDAGES/DRESSINGS) ×3 IMPLANT
GLOVE BIO SURGEON STRL SZ7 (GLOVE) ×2 IMPLANT
GLOVE BIOGEL PI IND STRL 7.0 (GLOVE) IMPLANT
GLOVE BIOGEL PI INDICATOR 7.0 (GLOVE) ×4
GLOVE SURG ORTHO 8.0 STRL STRW (GLOVE) ×3 IMPLANT
GLOVE SURG SS PI 7.0 STRL IVOR (GLOVE) ×4 IMPLANT
GOWN STRL REUS W/ TWL LRG LVL3 (GOWN DISPOSABLE) ×2 IMPLANT
GOWN STRL REUS W/ TWL XL LVL3 (GOWN DISPOSABLE) ×1 IMPLANT
GOWN STRL REUS W/TWL LRG LVL3 (GOWN DISPOSABLE) ×6
GOWN STRL REUS W/TWL XL LVL3 (GOWN DISPOSABLE) ×3
HEMOSTAT SURGICEL 2X4 FIBR (HEMOSTASIS) ×3 IMPLANT
KIT BASIN OR (CUSTOM PROCEDURE TRAY) ×3 IMPLANT
KIT ROOM TURNOVER OR (KITS) ×3 IMPLANT
NDL HYPO 25GX1X1/2 BEV (NEEDLE) IMPLANT
NEEDLE HYPO 25GX1X1/2 BEV (NEEDLE) ×3 IMPLANT
NS IRRIG 1000ML POUR BTL (IV SOLUTION) ×3 IMPLANT
PACK SURGICAL SETUP 50X90 (CUSTOM PROCEDURE TRAY) ×3 IMPLANT
PAD ARMBOARD 7.5X6 YLW CONV (MISCELLANEOUS) ×6 IMPLANT
PENCIL BUTTON HOLSTER BLD 10FT (ELECTRODE) ×3 IMPLANT
SPONGE GAUZE 2X2 STER 10/PKG (GAUZE/BANDAGES/DRESSINGS) ×2
SPONGE INTESTINAL PEANUT (DISPOSABLE) ×2 IMPLANT
STRIP CLOSURE SKIN 1/2X4 (GAUZE/BANDAGES/DRESSINGS) ×2 IMPLANT
SUT MNCRL AB 4-0 PS2 18 (SUTURE) ×3 IMPLANT
SUT SILK 2 0 (SUTURE)
SUT SILK 2-0 18XBRD TIE 12 (SUTURE) IMPLANT
SUT SILK 3 0 (SUTURE)
SUT SILK 3-0 18XBRD TIE 12 (SUTURE) IMPLANT
SUT VIC AB 3-0 SH 18 (SUTURE) ×3 IMPLANT
SYR BULB 3OZ (MISCELLANEOUS) ×3 IMPLANT
SYR CONTROL 10ML LL (SYRINGE) ×2 IMPLANT
TAPE CLOTH SURG 4X10 WHT LF (GAUZE/BANDAGES/DRESSINGS) ×2 IMPLANT
TOWEL OR 17X24 6PK STRL BLUE (TOWEL DISPOSABLE) ×3 IMPLANT
TOWEL OR 17X26 10 PK STRL BLUE (TOWEL DISPOSABLE) ×3 IMPLANT
TUBE CONNECTING 12'X1/4 (SUCTIONS) ×1
TUBE CONNECTING 12X1/4 (SUCTIONS) ×2 IMPLANT

## 2015-03-10 NOTE — Telephone Encounter (Signed)
Wife of pt called today, pt had surgery today on parathyroid and wants to know if he still needs to keep his apt for 5/31 please advise wife call her at [phone]

## 2015-03-10 NOTE — Telephone Encounter (Signed)
Called pt and re-scheduled appt for July 29th.

## 2015-03-10 NOTE — Anesthesia Procedure Notes (Signed)
Procedure Name: Intubation Date/Time: 03/10/2015 7:28 AM Performed by: Terrill Mohr Pre-anesthesia Checklist: Patient identified, Emergency Drugs available, Suction available and Patient being monitored Patient Re-evaluated:Patient Re-evaluated prior to inductionOxygen Delivery Method: Circle system utilized Preoxygenation: Pre-oxygenation with 100% oxygen Intubation Type: IV induction Ventilation: Mask ventilation without difficulty Laryngoscope Size: Mac and 4 Grade View: Grade II Tube type: Oral Tube size: 7.5 mm Number of attempts: 1 Airway Equipment and Method: Stylet Placement Confirmation: breath sounds checked- equal and bilateral and positive ETCO2 (semi blind) Secured at: 23 (cm at teeth) cm Tube secured with: Tape Dental Injury: Teeth and Oropharynx as per pre-operative assessment

## 2015-03-10 NOTE — Telephone Encounter (Signed)
Agree, let's move this to July.

## 2015-03-10 NOTE — Progress Notes (Signed)
Report given to sookie rn as caregiver

## 2015-03-10 NOTE — Transfer of Care (Signed)
Immediate Anesthesia Transfer of Care Note  Patient: Paul Lowe  Procedure(s) Performed: Procedure(s): PARATHYROIDECTOMY (N/A)  Patient Location: PACU  Anesthesia Type:General  Level of Consciousness: awake and patient cooperative  Airway & Oxygen Therapy: Patient Spontanous Breathing and Patient connected to nasal cannula oxygen  Post-op Assessment: Report given to RN, Post -op Vital signs reviewed and stable and Patient moving all extremities  Post vital signs: Reviewed and stable  Last Vitals:  Filed Vitals:   03/10/15 0610  BP: 121/84  Pulse: 65  Temp: 37 C  Resp: 20    Complications: No apparent anesthesia complications

## 2015-03-10 NOTE — Telephone Encounter (Signed)
Please read message below and advise. Thank you.  

## 2015-03-10 NOTE — Interval H&P Note (Signed)
History and Physical Interval Note:  03/10/2015 7:13 AM  Paul Lowe  has presented today for surgery, with the diagnosis of PRIMARY HYPERPARATHYROIDISM.  The various methods of treatment have been discussed with the patient and family. After consideration of risks, benefits and other options for treatment, the patient has consented to    Procedure(s): PARATHYROIDECTOMY (N/A) as a surgical intervention .    The patient's history has been reviewed, patient examined, no change in status, stable for surgery.  I have reviewed the patient's chart and labs.  Questions were answered to the patient's satisfaction.    Earnstine Regal, MD, [facility] Office: [place]

## 2015-03-10 NOTE — Anesthesia Preprocedure Evaluation (Addendum)
Anesthesia Evaluation  Patient identified by MRN, date of birth, ID band Patient awake    Reviewed: Allergy & Precautions, NPO status , Patient's Chart, lab work & pertinent test results  Airway Mallampati: II  TM Distance: >3 FB Neck ROM: Full    Dental  (+) Teeth Intact, Dental Advisory Given   Pulmonary  breath sounds clear to auscultation        Cardiovascular hypertension, Pt. on medications Rhythm:Regular Rate:Normal     Neuro/Psych    GI/Hepatic   Endo/Other    Renal/GU      Musculoskeletal   Abdominal   Peds  Hematology   Anesthesia Other Findings Fixed bridge upper middle, front  Reproductive/Obstetrics                          Anesthesia Physical Anesthesia Plan  ASA: II  Anesthesia Plan: General   Post-op Pain Management:    Induction: Intravenous  Airway Management Planned: Oral ETT  Additional Equipment: 3D TEE, Arterial line, CVP and PA Cath  Intra-op Plan:   Post-operative Plan:   Informed Consent: I have reviewed the patients History and Physical, chart, labs and discussed the procedure including the risks, benefits and alternatives for the proposed anesthesia with the patient or authorized representative who has indicated his/her understanding and acceptance.   Dental advisory given  Plan Discussed with: CRNA and Anesthesiologist  Anesthesia Plan Comments:         Anesthesia Quick Evaluation

## 2015-03-10 NOTE — Op Note (Signed)
OPERATIVE REPORT - PARATHYROIDECTOMY  Preoperative diagnosis: Primary hyperparathyroidism  Postop diagnosis: Same  Procedure: Left superior minimally invasive parathyroidectomy  Surgeon:  Earnstine Regal, MD, FACS  Anesthesia: Gen. endotracheal  Estimated blood loss: Minimal  Preparation: ChloraPrep  Indications: The patient is a 74 year old male who presents with a parathyroid neoplasm. Patient is referred by Dr. Stevie Kern and Dr. Philemon Kingdom for evaluation of primary hyperparathyroidism. Patient was noted on routine laboratory studies to have an elevated calcium level. This is been slowly rising since 2011. Calcium levels currently range from 10.7-11.3. Intact PTH level was measured and was elevated as high as 116. Recent level remains elevated at 71. Patient underwent a 24-hour urine collection for calcium which was normal at 143. Nuclear scan localized an adenoma to the left inferior position.  Patient now comes to surgery for minimally invasive parathyroidectomy.  Procedure: Patient was prepared in the holding area. He was brought to operating room and placed in a supine position on the operating room table. Following administration of general anesthesia, the patient was positioned and then prepped and draped in the usual strict aseptic fashion. After ascertaining that an adequate level of anesthesia been achieved, a neck incision was made with a #15 blade. Dissection was carried through subcutaneous tissues and platysma. Hemostasis was obtained with the electrocautery. Skin flaps were developed circumferentially and a Weitlander retractor was placed for exposure.  Strap muscles were incised in the midline. Strap muscles were reflected exposing the thyroid lobe. With gentle blunt dissection the thyroid lobe was mobilized.  Dissection was carried through adipose tissue and an enlarged parathyroid gland was identified. It was gently mobilized. Vascular structures appear to  originate from the superior thyroid pole and were divided between small and medium ligaclips. Care was taken to avoid the recurrent laryngeal nerve and the esophagus. The parathyroid gland was completely excised. It was submitted to pathology where frozen section confirmed parathyroid tissue consistent with adenoma.  Neck was irrigated with warm saline and good hemostasis was noted. Fibrillar was placed in the operative field. Strap muscles were reapproximated in the midline with interrupted 3-0 Vicryl sutures. Platysma was closed with interrupted 3-0 Vicryl sutures. Skin was closed with a running 4-0 Monocryl subcuticular suture. Marcaine was infiltrated circumferentially. Wound was washed and dried and benzoin and Steri-Strips were applied. Sterile gauze dressings were applied. Patient was awakened from anesthesia and brought to the recovery room. The patient tolerated the procedure well.   Earnstine Regal, MD, [facility]

## 2015-03-10 NOTE — Anesthesia Postprocedure Evaluation (Signed)
  Anesthesia Post-op Note  Patient: Paul Lowe  Procedure(s) Performed: Procedure(s): PARATHYROIDECTOMY (N/A)  Patient Location: PACU  Anesthesia Type:General  Level of Consciousness: awake, alert  and oriented  Airway and Oxygen Therapy: Patient Spontanous Breathing and Patient connected to nasal cannula oxygen  Post-op Pain: mild  Post-op Assessment: Post-op Vital signs reviewed, Patient's Cardiovascular Status Stable, Respiratory Function Stable, Patent Airway and Pain level controlled  Post-op Vital Signs: stable  Last Vitals:  Filed Vitals:   03/10/15 0930  BP:   Pulse: 59  Temp:   Resp: 11    Complications: No apparent anesthesia complications

## 2015-03-11 ENCOUNTER — Encounter (HOSPITAL_COMMUNITY): Payer: Self-pay | Admitting: Surgery

## 2015-03-11 NOTE — Addendum Note (Signed)
Addendum  created 03/11/15 1540 by Terrill Mohr, CRNA   Modules edited: Anesthesia Events, Narrator   Narrator:  Narrator: Event Log Edited

## 2015-03-15 ENCOUNTER — Ambulatory Visit: Payer: Commercial Managed Care - HMO | Admitting: Internal Medicine

## 2015-05-13 ENCOUNTER — Ambulatory Visit (INDEPENDENT_AMBULATORY_CARE_PROVIDER_SITE_OTHER): Payer: Commercial Managed Care - HMO | Admitting: Internal Medicine

## 2015-05-13 ENCOUNTER — Encounter: Payer: Self-pay | Admitting: Internal Medicine

## 2015-05-13 VITALS — BP 118/62 | HR 83 | Temp 98.6°F | Resp 12 | Wt 186.8 lb

## 2015-05-13 DIAGNOSIS — E210 Primary hyperparathyroidism: Secondary | ICD-10-CM

## 2015-05-13 LAB — VITAMIN D 25 HYDROXY (VIT D DEFICIENCY, FRACTURES): VITD: 34.74 ng/mL (ref 30.00–100.00)

## 2015-05-13 MED ORDER — FUROSEMIDE 20 MG PO TABS: 20.0000 mg | ORAL_TABLET | Freq: Every day | ORAL | Status: DC

## 2015-05-13 MED ORDER — LISINOPRIL 10 MG PO TABS: 10.0000 mg | ORAL_TABLET | Freq: Every day | ORAL | Status: DC

## 2015-05-13 NOTE — Progress Notes (Signed)
Patient ID: Paul Lowe, male   DOB: 1941-05-10, 74 y.o.   MRN: [id number]   HPI  Paul Lowe is a 74 y.o.-year-old male, returning for f/u for primary hyperparathyroidism. Last visit 6 mo ago.  Reviewed and addended hx: Pt was dx with hypercalcemia in 2011 per review of chart. I reviewed pt's pertinent labs: Lab Results  Component Value Date   PTH 71* 12/13/2014   PTH 75* 11/12/2014   PTH Comment 11/12/2014   PTH 116* 10/20/2014   CALCIUM 10.8* 02/28/2015   CALCIUM 10.7* 12/13/2014   CALCIUM 10.8* 11/12/2014   CALCIUM 11.3* 10/20/2014   CALCIUM 11.3* 10/20/2014   CALCIUM 11.2* 10/20/2014   CALCIUM 11.4* 09/30/2014   CALCIUM 11.0* 09/29/2013   CALCIUM 11.0* 08/05/2012   CALCIUM 10.3 06/01/2010   No fractures or falls.   No h/o kidney stones.  He has a h/o CKD stage 2-3. Last BUN/Cr: Lab Results  Component Value Date   BUN 21* 02/28/2015   CREATININE 1.21 02/28/2015   Pt is on HCTZ 12.5 mg.   No h/o vitamin D deficiency.   Pt is on calcium and vitamin D (in MVI); he also eats dairy but not a lot of green, leafy, vegetables.   Pt does not have a FH of hypercalcemia, pituitary tumors, thyroid cancer, or osteoporosis.   Reviewed pertinent labs:  Component     Latest Ref Rng 11/12/2014 11/12/2014        10:09 AM 10:09 AM  Calcium     8.6 - 10.2 mg/dL  10.8 (H)  PTH     15 - 65 pg/mL  75 (H)  Vitamin D 1, 25 (OH) Total     18 - 72 pg/mL 34   Vitamin D3 1, 25 (OH)      34   Vitamin D2 1, 25 (OH)      <8   Magnesium     1.5 - 2.5 mg/dL 2.1   Phosphorus     2.3 - 4.6 mg/dL 2.4   VITD     30.00 - 100.00 ng/mL 37.96    Normal vitamin D Mg normal (Unlikely [place]). Phos at the LLN. PTH high, Ca high. 1,25 diHO vit D normal.  Component     Latest Ref Rng 11/22/2014  Calcium, Ur      11  Calcium, 24 hour urine     100 - 250 mg/day 143  Creatinine, Urine      97.4  Creatinine, 24H Ur     800 - 2000 mg/day 1267  UCa normal (off HCTZ). FECa  ~1.  Repeat Ca + PTH off HCTZ still abnormal: Component     Latest Ref Rng 12/13/2014          Calcium     8.6 - 10.2 mg/dL 10.7 (H)  PTH     15 - 65 pg/mL 71 (H)   I suspected Primary HPTH >> referred to surgery.  Tc Sestamibi scan 01/17/2015: Parathyroid adenoma localizes to the inferior pole region of the left lobe of thyroid gland.  He had parathyroidectomy 03/10/2015 (adenoma) - 2g.  He is feeling better after te surgery!  He also has a history of HL, HTN, BPH.   ROS: Constitutional: no weight gain/loss, no fatigue, no subjective hyperthermia/hypothermia Eyes: no blurry vision, no xerophthalmia ENT: no sore throat, no nodules palpated in throat, no dysphagia/odynophagia, no hoarseness Cardiovascular: no CP/SOB/palpitations/leg swelling Respiratory: no cough/SOB Gastrointestinal: no N/V/D/C/+ occasional acid reflux Musculoskeletal: no muscle/joint aches Skin:  no rashes Neurological: no tremors/numbness/tingling/dizziness  I reviewed pt's medications, allergies, PMH, social hx, family hx, and changes were documented in the history of present illness. Otherwise, unchanged from my initial visit note.  Past Medical History  Diagnosis Date  . Allergic rhinitis   . Hyperlipidemia   . Osteoarthritis   . Adult acne   . Hypertension    Past Surgical History  Procedure Laterality Date  . Appendectomy    . Anal cyst    . Cataract extraction, bilateral    . Parathyroidectomy N/A 03/10/2015    Procedure: PARATHYROIDECTOMY;  Surgeon: Armandina Gemma, MD;  Location: [facility];  Service: General;  Laterality: N/A;   History   Social History  . Marital Status: Married    Spouse Name: N/A    Number of Children: 0   Occupational History  . Retired professor, now part-time - Research scientist (life sciences)   Social History Main Topics  . Smoking status: Never Smoker   . Smokeless tobacco: Not on file  . Alcohol Use: Wine, 2-3x a week, 1-2 drinks  . Drug Use: No  Exercise: tennis, more in  summer  Current Outpatient Prescriptions on File Prior to Visit  Medication Sig Dispense Refill  . Ascorbic Acid (VITAMIN C) 100 MG tablet Take 100 mg by mouth daily.      Marland Kitchen aspirin 81 MG tablet Take 81 mg by mouth daily.      . fish oil-omega-3 fatty acids 1000 MG capsule Take 2 g by mouth daily.    . furosemide (LASIX) 20 MG tablet Take 1 tablet (20 mg total) by mouth daily. 30 tablet 2  . lisinopril (PRINIVIL,ZESTRIL) 10 MG tablet Take 1 tablet (10 mg total) by mouth daily. 30 tablet 2  . loratadine (CLARITIN) 10 MG tablet Take 10 mg by mouth daily.      . meloxicam (MOBIC) 15 MG tablet Take 1 tablet (15 mg total) by mouth daily. 90 tablet 3  . Multiple Vitamin tablet Take 1 a day 30 tablet 0  . simvastatin (ZOCOR) 40 MG tablet Take 1 tablet (40 mg total) by mouth at bedtime. (Patient taking differently: Take 20 mg by mouth at bedtime. ) 100 tablet 3  . oxyCODONE (OXY IR/ROXICODONE) 5 MG immediate release tablet Take 1-2 tablets (5-10 mg total) by mouth every 4 (four) hours as needed for moderate pain. (Patient not taking: Reported on 05/13/2015) 20 tablet 0   No current facility-administered medications on file prior to visit.   Allergies  Allergen Reactions  . Shrimp Flavor     REACTION: rash   Family History  Problem Relation Age of Onset  . Stroke Mother    PE: BP 118/62 mmHg  Pulse 83  Temp(Src) 98.6 F (37 C) (Oral)  Resp 12  Wt 186 lb 12.8 oz (84.732 kg)  SpO2 94% Wt Readings from Last 3 Encounters:  05/13/15 186 lb 12.8 oz (84.732 kg)  03/10/15 183 lb (83.008 kg)  02/28/15 183 lb 11.2 oz (83.326 kg)   Constitutional: overweight, in NAD. No kyphosis. Eyes: PERRLA, EOMI, no exophthalmos ENT: moist mucous membranes, no thyromegaly, cervical scar healing. No swelling or tenderness; no cervical lymphadenopathy Cardiovascular: RRR, No MRG Respiratory: CTA B Gastrointestinal: abdomen soft, NT, ND, BS+ Musculoskeletal: no deformities, strength intact in all 4 Skin:  moist, warm, no rashes Neurological: no tremor with outstretched hands, DTR normal in all 4  Assessment: 1. Hypercalcemia/hyperparathyroidism  Plan: Patient has had several instances of elevated calcium, with the highest level being at  11.4. An intact PTH level was also high, at 116 (at that time, Ca 11.3). He had normal UCa but primary hyperparathyroidism was confirmed by Tc sestamibi scan. He since had L inf parathyroidectomy >> adenoma weighed 2g. We reviewed pathology together today.  - He is feeling much better after the surgery - today we will check PTH, Ca, vit D - will continue to follow him yearly  Office Visit on 05/13/2015  Component Date Value Ref Range Status  . VITD 05/13/2015 34.74  30.00 - 100.00 ng/mL Final  . Calcium 05/13/2015 9.4  8.6 - 10.2 mg/dL Final  . PTH 05/13/2015 36  15 - 65 pg/mL Final  . PTH 05/13/2015 Comment   Final   Comment: Interpretation                 Intact PTH    Calcium                                 (pg/mL)      (mg/dL) Normal                          15 - 65     8.6 - 10.2 Primary Hyperparathyroidism         >65          >10.2 Secondary Hyperparathyroidism       >65          <10.2 Non-Parathyroid Hypercalcemia       <65          >10.2 Hypoparathyroidism                  <15          < 8.6 Non-Parathyroid Hypocalcemia    15 - 65          < 8.6    All labs are normal.

## 2015-05-13 NOTE — Patient Instructions (Signed)
Please stop at the lab.  Please return in 1 year.  

## 2015-05-14 LAB — PTH, INTACT AND CALCIUM
Calcium: 9.4 mg/dL (ref 8.6–10.2)
PTH: 36 pg/mL (ref 15–65)

## 2015-07-19 ENCOUNTER — Encounter: Payer: Self-pay | Admitting: Family Medicine

## 2015-07-19 ENCOUNTER — Ambulatory Visit (INDEPENDENT_AMBULATORY_CARE_PROVIDER_SITE_OTHER): Payer: Commercial Managed Care - HMO | Admitting: Family Medicine

## 2015-07-19 VITALS — BP 120/80 | Temp 98.5°F | Wt 187.0 lb

## 2015-07-19 DIAGNOSIS — I10 Essential (primary) hypertension: Secondary | ICD-10-CM

## 2015-07-19 DIAGNOSIS — E785 Hyperlipidemia, unspecified: Secondary | ICD-10-CM

## 2015-07-19 DIAGNOSIS — Z23 Encounter for immunization: Secondary | ICD-10-CM

## 2015-07-19 MED ORDER — SIMVASTATIN 40 MG PO TABS: 40.0000 mg | ORAL_TABLET | Freq: Every day | ORAL | Status: DC

## 2015-07-19 MED ORDER — LISINOPRIL 10 MG PO TABS: 10.0000 mg | ORAL_TABLET | Freq: Every day | ORAL | Status: DC

## 2015-07-19 MED ORDER — FUROSEMIDE 20 MG PO TABS: 20.0000 mg | ORAL_TABLET | Freq: Every day | ORAL | Status: DC

## 2015-07-19 NOTE — Patient Instructions (Signed)
Continue current medications  Set up a time in the next couple months for general physical examination

## 2015-07-19 NOTE — Progress Notes (Signed)
Pre visit review using our clinic review tool, if applicable. No additional management support is needed unless otherwise documented below in the visit note. 

## 2015-07-19 NOTE — Progress Notes (Signed)
   Subjective:    Patient ID: Paul Lowe, male    DOB: [birth year], 74 y.o.   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  The cerumen impaction relieved by suction irrigation      Assessment & Plan:  Hearing loss secondary to cerumen impactions ,,,,,,,,,, cerumen removed with irrigation

## 2015-11-14 ENCOUNTER — Other Ambulatory Visit: Payer: Self-pay | Admitting: Family Medicine

## 2015-11-14 MED ORDER — MELOXICAM 15 MG PO TABS: 15.0000 mg | ORAL_TABLET | Freq: Every day | ORAL | Status: DC

## 2015-12-01 ENCOUNTER — Other Ambulatory Visit (INDEPENDENT_AMBULATORY_CARE_PROVIDER_SITE_OTHER): Payer: PPO

## 2015-12-01 DIAGNOSIS — Z125 Encounter for screening for malignant neoplasm of prostate: Secondary | ICD-10-CM

## 2015-12-01 DIAGNOSIS — Z0000 Encounter for general adult medical examination without abnormal findings: Secondary | ICD-10-CM | POA: Diagnosis not present

## 2015-12-01 LAB — HEPATIC FUNCTION PANEL
ALT: 17 U/L (ref 0–53)
AST: 18 U/L (ref 0–37)
Albumin: 4.2 g/dL (ref 3.5–5.2)
Alkaline Phosphatase: 69 U/L (ref 39–117)
BILIRUBIN DIRECT: 0.2 mg/dL (ref 0.0–0.3)
BILIRUBIN TOTAL: 0.7 mg/dL (ref 0.2–1.2)
Total Protein: 6.9 g/dL (ref 6.0–8.3)

## 2015-12-01 LAB — LIPID PANEL
CHOL/HDL RATIO: 4
CHOLESTEROL: 126 mg/dL (ref 0–200)
HDL: 32.8 mg/dL — AB (ref >39.00)
LDL CALC: 69 mg/dL (ref 0–99)
NonHDL: 93.69
TRIGLYCERIDES: 125 mg/dL (ref 0.0–149.0)
VLDL: 25 mg/dL (ref 0.0–40.0)

## 2015-12-01 LAB — BASIC METABOLIC PANEL
BUN: 18 mg/dL (ref 6–23)
CHLORIDE: 103 meq/L (ref 96–112)
CO2: 30 mEq/L (ref 19–32)
CREATININE: 1.26 mg/dL (ref 0.40–1.50)
Calcium: 9.5 mg/dL (ref 8.4–10.5)
GFR: 59.62 mL/min — ABNORMAL LOW (ref >60.00)
GLUCOSE: 86 mg/dL (ref 70–99)
POTASSIUM: 5.2 meq/L — AB (ref 3.5–5.1)
Sodium: 139 mEq/L (ref 135–145)

## 2015-12-01 LAB — POC URINALSYSI DIPSTICK (AUTOMATED)
BILIRUBIN UA: NEGATIVE
Blood, UA: NEGATIVE
GLUCOSE UA: NEGATIVE
Ketones, UA: NEGATIVE
Leukocytes, UA: NEGATIVE
NITRITE UA: NEGATIVE
Protein, UA: NEGATIVE
Spec Grav, UA: 1.02
UROBILINOGEN UA: 0.2
pH, UA: 7.5

## 2015-12-01 LAB — CBC WITH DIFFERENTIAL/PLATELET
BASOS PCT: 0.5 % (ref 0.0–3.0)
Basophils Absolute: 0 10*3/uL (ref 0.0–0.1)
EOS ABS: 0.4 10*3/uL (ref 0.0–0.7)
EOS PCT: 4.7 % (ref 0.0–5.0)
HCT: 46.2 % (ref 39.0–52.0)
Hemoglobin: 16.1 g/dL (ref 13.0–17.0)
LYMPHS ABS: 1.9 10*3/uL (ref 0.7–4.0)
Lymphocytes Relative: 22.9 % (ref 12.0–46.0)
MCHC: 34.8 g/dL (ref 30.0–36.0)
MCV: 93.8 fl (ref 78.0–100.0)
MONO ABS: 0.6 10*3/uL (ref 0.1–1.0)
Monocytes Relative: 7.4 % (ref 3.0–12.0)
NEUTROS ABS: 5.3 10*3/uL (ref 1.4–7.7)
NEUTROS PCT: 64.5 % (ref 43.0–77.0)
PLATELETS: 210 10*3/uL (ref 150.0–400.0)
RBC: 4.93 Mil/uL (ref 4.22–5.81)
RDW: 12.4 % (ref 11.5–15.5)
WBC: 8.2 10*3/uL (ref 4.0–10.5)

## 2015-12-01 LAB — PSA: PSA: 2.44 ng/mL (ref 0.10–4.00)

## 2015-12-01 LAB — TSH: TSH: 0.92 u[IU]/mL (ref 0.35–4.50)

## 2015-12-05 ENCOUNTER — Encounter: Payer: Commercial Managed Care - HMO | Admitting: Family Medicine

## 2015-12-12 ENCOUNTER — Ambulatory Visit (INDEPENDENT_AMBULATORY_CARE_PROVIDER_SITE_OTHER): Payer: PPO | Admitting: Family Medicine

## 2015-12-12 ENCOUNTER — Encounter: Payer: Self-pay | Admitting: Family Medicine

## 2015-12-12 VITALS — BP 110/80 | Temp 98.1°F | Ht 70.0 in | Wt 193.0 lb

## 2015-12-12 DIAGNOSIS — I10 Essential (primary) hypertension: Secondary | ICD-10-CM | POA: Diagnosis not present

## 2015-12-12 DIAGNOSIS — N401 Enlarged prostate with lower urinary tract symptoms: Secondary | ICD-10-CM

## 2015-12-12 DIAGNOSIS — M169 Osteoarthritis of hip, unspecified: Secondary | ICD-10-CM

## 2015-12-12 DIAGNOSIS — E785 Hyperlipidemia, unspecified: Secondary | ICD-10-CM | POA: Diagnosis not present

## 2015-12-12 DIAGNOSIS — R351 Nocturia: Secondary | ICD-10-CM

## 2015-12-12 DIAGNOSIS — Z0000 Encounter for general adult medical examination without abnormal findings: Secondary | ICD-10-CM | POA: Insufficient documentation

## 2015-12-12 MED ORDER — FUROSEMIDE 20 MG PO TABS: 20.0000 mg | ORAL_TABLET | Freq: Every day | ORAL | Status: DC

## 2015-12-12 MED ORDER — LISINOPRIL 10 MG PO TABS: 10.0000 mg | ORAL_TABLET | Freq: Every day | ORAL | Status: DC

## 2015-12-12 MED ORDER — MELOXICAM 15 MG PO TABS: 15.0000 mg | ORAL_TABLET | Freq: Every day | ORAL | Status: DC

## 2015-12-12 MED ORDER — SIMVASTATIN 40 MG PO TABS: 40.0000 mg | ORAL_TABLET | Freq: Every day | ORAL | Status: DC

## 2015-12-12 NOTE — Patient Instructions (Signed)
Continue current medications  Remember to walk 30 minutes daily  Follow-up in one year sooner if any problems........: September for your physical examination in February....... Tommi Rumps or Almyra Free are 2 new adult nurse practitioner's or Dr. Martinique

## 2015-12-12 NOTE — Progress Notes (Signed)
   Subjective:    Patient ID: Paul Lowe, male    DOB: 01-24-1941, 75 y.o.   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 He comes in today for general physical examination because of a history of hyperlipidemia, osteoarthritis, hypertension.  His blood pressure Lasix 20 mg daily and lisinopril 10 mg daily is 110/80  He takes no big or Motrin when necessary for osteoarthritis  He takes Zocor 40 mg daily for hyperlipidemia along with an aspirin tablet. Lipids are at goal  He gets routine eye care, dental care, last colonoscopy August 2008 he'll be given a recall this year.  Vaccinations he still a tetanus booster he'll call his insurance company to find out where you get it done the cheapest  Cognitive function normal he still works full-time, home health safety reviewed no issues identified, no guns in the house, he does have a healthcare power of attorney and living well.   Review of Systems  Constitutional: Negative.   HENT: Negative.   Eyes: Negative.   Respiratory: Negative.   Cardiovascular: Negative.   Gastrointestinal: Negative.   Endocrine: Negative.   Genitourinary: Negative.   Musculoskeletal: Negative.   Skin: Negative.   Allergic/Immunologic: Negative.   Neurological: Negative.   Hematological: Negative.   Psychiatric/Behavioral: Negative.        Objective:   Physical Exam  Constitutional: He is oriented to person, place, and time. He appears well-developed and well-nourished.  HENT:  Head: Normocephalic and atraumatic.  Right Ear: External ear normal.  Left Ear: External ear normal.  Nose: Nose normal.  Mouth/Throat: Oropharynx is clear and moist.  Eyes: Conjunctivae and EOM are normal. Pupils are equal, round, and reactive to light.  Neck: Normal range of motion. Neck supple. No JVD present. No tracheal deviation present. No thyromegaly  present.  Cardiovascular: Normal rate, regular rhythm, normal heart sounds and intact distal pulses.  Exam reveals no gallop and no friction rub.   No murmur heard. No carotid nor aortic bruits  Pulmonary/Chest: Effort normal and breath sounds normal. No stridor. No respiratory distress. He has no wheezes. He has no rales. He exhibits no tenderness.  Abdominal: Soft. Bowel sounds are normal. He exhibits no distension and no mass. There is no tenderness. There is no rebound and no guarding.  Genitourinary: Rectum normal and penis normal. Guaiac negative stool. No penile tenderness.  3+ symmetrical nonnodular BPH  Musculoskeletal: Normal range of motion. He exhibits no edema or tenderness.  Scars both knees from previous total knee replacements  Lymphadenopathy:    He has no cervical adenopathy.  Neurological: He is alert and oriented to person, place, and time. He has normal reflexes. No cranial nerve deficit. He exhibits normal muscle tone.  Skin: Skin is warm and dry. No rash noted. No erythema. No pallor.  Total body skin exam normal he has numerous seborrheic keratosis  Psychiatric: He has a normal mood and affect. His behavior is normal. Judgment and thought content normal.  Nursing note and vitals reviewed.         Assessment & Plan:  Hypertension at goal..... Continue current therapy  Hyperlipidemia goal.......... continue current therapy  Status post bilateral knee replacements....... continue daily exercise and Mobic 15 mg daily or Motrin  BPH..... Nocturia 1 and relatively asymptomatic therefore no therapy.

## 2015-12-12 NOTE — Progress Notes (Signed)
Pre visit review using our clinic review tool, if applicable. No additional management support is needed unless otherwise documented below in the visit note. 

## 2015-12-19 ENCOUNTER — Ambulatory Visit (INDEPENDENT_AMBULATORY_CARE_PROVIDER_SITE_OTHER): Payer: PPO | Admitting: *Deleted

## 2015-12-19 DIAGNOSIS — Z23 Encounter for immunization: Secondary | ICD-10-CM | POA: Diagnosis not present

## 2016-05-18 ENCOUNTER — Ambulatory Visit: Payer: Commercial Managed Care - HMO | Admitting: Internal Medicine

## 2016-05-21 ENCOUNTER — Encounter: Payer: Self-pay | Admitting: Internal Medicine

## 2016-05-21 ENCOUNTER — Ambulatory Visit (INDEPENDENT_AMBULATORY_CARE_PROVIDER_SITE_OTHER): Payer: PPO | Admitting: Internal Medicine

## 2016-05-21 VITALS — BP 120/74 | HR 76 | Ht 70.0 in | Wt 190.0 lb

## 2016-05-21 DIAGNOSIS — E210 Primary hyperparathyroidism: Secondary | ICD-10-CM | POA: Diagnosis not present

## 2016-05-21 LAB — VITAMIN D 25 HYDROXY (VIT D DEFICIENCY, FRACTURES): VITD: 47.1 ng/mL (ref 30.00–100.00)

## 2016-05-21 NOTE — Patient Instructions (Addendum)
Please stop at the lab. Please return to see me as needed (if calcium starts increasing).

## 2016-05-21 NOTE — Progress Notes (Signed)
Patient ID: Paul Lowe, male   DOB: 12-Aug-1941, 75 y.o.   MRN: [id number]   HPI  Paul Lowe is a 75 y.o.-year-old male, returning for f/u for h/o primary hyperparathyroidism, now s/p parathyroidectomy. Last visit 1 year ago.  Reviewed and addended hx: Pt was dx with hypercalcemia in 2011 per review of chart. I reviewed pt's pertinent labs: Lab Results  Component Value Date   PTH 36 05/13/2015   PTH Comment 05/13/2015   PTH 71 (H) 12/13/2014   PTH 75 (H) 11/12/2014   PTH Comment 11/12/2014   PTH 116 (H) 10/20/2014   CALCIUM 9.5 12/01/2015   CALCIUM 9.4 05/13/2015   CALCIUM 10.8 (H) 02/28/2015   CALCIUM 10.7 (H) 12/13/2014   CALCIUM 10.8 (H) 11/12/2014   CALCIUM 11.3 (H) 10/20/2014   CALCIUM 11.3 (H) 10/20/2014   CALCIUM 11.2 (H) 10/20/2014   CALCIUM 11.4 (H) 09/30/2014   CALCIUM 11.0 (H) 09/29/2013   No fractures or falls.   No h/o kidney stones.  He has a h/o CKD stage 2-3. Last BUN/Cr: Lab Results  Component Value Date   BUN 18 12/01/2015   CREATININE 1.26 12/01/2015   Pt is on HCTZ 12.5 mg.   No h/o vitamin D deficiency.   Pt is on calcium and vitamin D (in MVI); he also eats dairy but not a lot of green, leafy, vegetables.   Pt does not have a FH of hypercalcemia, pituitary tumors, thyroid cancer, or osteoporosis.   Reviewed other pertinent labs: Component     Latest Ref Rng 11/12/2014        10:09 AM  Vitamin D 1, 25 (OH) Total     18 - 72 pg/mL 34  Vitamin D3 1, 25 (OH)      34  Vitamin D2 1, 25 (OH)      <8  Magnesium     1.5 - 2.5 mg/dL 2.1  Phosphorus     2.3 - 4.6 mg/dL 2.4  VITD     30.00 - 100.00 ng/mL 37.96   Normal vitamin D Mg normal (Unlikely [place]). Phos at the LLN. PTH high, Ca high. 1,25 diHO vit D normal.  Component     Latest Ref Rng 11/22/2014  Calcium, Ur      11  Calcium, 24 hour urine     100 - 250 mg/day 143  Creatinine, Urine      97.4  Creatinine, 24H Ur     800 - 2000 mg/day 1267  UCa normal (off HCTZ).  FECa ~1.  Repeat Ca + PTH off HCTZ still abnormal: Component     Latest Ref Rng 12/13/2014          Calcium     8.6 - 10.2 mg/dL 10.7 (H)  PTH     15 - 65 pg/mL 71 (H)   I suspected Primary HPTH >> referred him to surgery.  Tc Sestamibi scan 01/17/2015: Parathyroid adenoma localizes to the inferior pole region of the left lobe of thyroid gland.  He had parathyroidectomy 03/10/2015 (adenoma) - 2g.  He is feeling better after surgery!  ROS: Constitutional: no weight gain/loss, no fatigue, no subjective hyperthermia/hypothermia Eyes: no blurry vision, no xerophthalmia ENT: no sore throat, no nodules palpated in throat, no dysphagia/odynophagia, no hoarseness Cardiovascular: no CP/SOB/palpitations/leg swelling Respiratory: no cough/SOB Gastrointestinal: no N/V/D/C/acid reflux Musculoskeletal: no muscle/joint aches Skin: + rash - on abdomen x 1 week, not itching Neurological: no tremors/numbness/tingling/dizziness  I reviewed pt's medications, allergies, PMH, social hx, family  hx, and changes were documented in the history of present illness. Otherwise, unchanged from my initial visit note.  Past Medical History:  Diagnosis Date  . Adult acne   . Allergic rhinitis   . Hyperlipidemia   . Hypertension   . Osteoarthritis    Past Surgical History:  Procedure Laterality Date  . anal cyst    . APPENDECTOMY    . CATARACT EXTRACTION, BILATERAL    . PARATHYROIDECTOMY N/A 03/10/2015   Procedure: PARATHYROIDECTOMY;  Surgeon: Armandina Gemma, MD;  Location: [facility];  Service: General;  Laterality: N/A;   History   Social History  . Marital Status: Married    Spouse Name: N/A    Number of Children: 0   Occupational History  . Retired professor, now part-time - Research scientist (life sciences)   Social History Main Topics  . Smoking status: Never Smoker   . Smokeless tobacco: Not on file  . Alcohol Use: Wine, 2-3x a week, 1-2 drinks  . Drug Use: No  Exercise: tennis, more in summer  Current  Outpatient Prescriptions on File Prior to Visit  Medication Sig Dispense Refill  . Ascorbic Acid (VITAMIN C) 100 MG tablet Take 100 mg by mouth daily.      Marland Kitchen aspirin 81 MG tablet Take 81 mg by mouth daily.      . fish oil-omega-3 fatty acids 1000 MG capsule Take 2 g by mouth daily.    . furosemide (LASIX) 20 MG tablet Take 1 tablet (20 mg total) by mouth daily. 90 tablet 3  . lisinopril (PRINIVIL,ZESTRIL) 10 MG tablet Take 1 tablet (10 mg total) by mouth daily. 90 tablet 3  . loratadine (CLARITIN) 10 MG tablet Take 10 mg by mouth daily.      . meloxicam (MOBIC) 15 MG tablet Take 1 tablet (15 mg total) by mouth daily. 90 tablet 3  . Multiple Vitamin tablet Take 1 a day 30 tablet 0  . simvastatin (ZOCOR) 40 MG tablet Take 1 tablet (40 mg total) by mouth at bedtime. 100 tablet 3   No current facility-administered medications on file prior to visit.    Allergies  Allergen Reactions  . Shrimp Flavor     REACTION: rash   Family History  Problem Relation Age of Onset  . Stroke Mother    PE: BP 120/74 (BP Location: Left Arm, Patient Position: Sitting)   Pulse 76   Ht 5\' 10"  (1.778 m)   Wt 190 lb (86.2 kg)   SpO2 93%   BMI 27.26 kg/m  Wt Readings from Last 3 Encounters:  05/21/16 190 lb (86.2 kg)  12/12/15 193 lb (87.5 kg)  07/19/15 187 lb (84.8 kg)   Constitutional: overweight, in NAD. No kyphosis. Eyes: PERRLA, EOMI, no exophthalmos ENT: moist mucous membranes, no thyromegaly, cervical scar healed; no cervical lymphadenopathy Cardiovascular: RRR, No MRG Respiratory: CTA B Gastrointestinal: abdomen soft, NT, ND, BS+ Musculoskeletal: no deformities, strength intact in all 4 Skin: moist, warm, + rash - maculopapular on abd Neurological: no tremor with outstretched hands, DTR normal in all 4  Assessment: 1. Hypercalcemia/hyperparathyroidism  Plan: Patient has had several instances of elevated calcium, with the highest level being at 11.4. An intact PTH level was also high, at  116 (at that time, Ca 11.3). He had normal UCa but primary hyperparathyroidism was confirmed by Tc sestamibi scan. He since had L inf parathyroidectomy >> adenoma weighed 2g. Calcium and PTH levels were normal after his surgery. - He is feeling much better after the surgery -  today we will check PTH, Ca, vit D - continue with the 1 MVI a day - will continue to follow with PCP from now on (with yearly calcium levels) and RTC if they start to increase  Office Visit on 05/21/2016  Component Date Value Ref Range Status  . VITD 05/21/2016 47.10  30.00 - 100.00 ng/mL Final  . Calcium 05/22/2016 9.7  8.6 - 10.2 mg/dL Final  . PTH 05/22/2016 29  15 - 65 pg/mL Final  . PTH 05/22/2016 Comment   Final   Comment: Interpretation                 Intact PTH    Calcium                                 (pg/mL)      (mg/dL) Normal                          15 - 65     8.6 - 10.2 Primary Hyperparathyroidism         >65          >10.2 Secondary Hyperparathyroidism       >65          <10.2 Non-Parathyroid Hypercalcemia       <65          >10.2 Hypoparathyroidism                  <15          < 8.6 Non-Parathyroid Hypocalcemia    15 - 65          < 8.6    Excellent results!  Philemon Kingdom, MD PhD [facility]

## 2016-05-22 ENCOUNTER — Encounter: Payer: Self-pay | Admitting: Adult Health

## 2016-05-22 ENCOUNTER — Ambulatory Visit (INDEPENDENT_AMBULATORY_CARE_PROVIDER_SITE_OTHER): Payer: PPO | Admitting: Adult Health

## 2016-05-22 VITALS — BP 120/80 | HR 73 | Temp 98.2°F | Ht 70.0 in | Wt 188.6 lb

## 2016-05-22 DIAGNOSIS — Z7689 Persons encountering health services in other specified circumstances: Secondary | ICD-10-CM

## 2016-05-22 DIAGNOSIS — I10 Essential (primary) hypertension: Secondary | ICD-10-CM | POA: Diagnosis not present

## 2016-05-22 DIAGNOSIS — E785 Hyperlipidemia, unspecified: Secondary | ICD-10-CM | POA: Diagnosis not present

## 2016-05-22 DIAGNOSIS — Z7189 Other specified counseling: Secondary | ICD-10-CM | POA: Diagnosis not present

## 2016-05-22 LAB — PTH, INTACT AND CALCIUM
CALCIUM: 9.7 mg/dL (ref 8.6–10.2)
PTH: 29 pg/mL (ref 15–65)

## 2016-05-22 NOTE — Progress Notes (Signed)
Pre visit review using our clinic review tool, if applicable. No additional management support is needed unless otherwise documented below in the visit note. 

## 2016-05-22 NOTE — Patient Instructions (Addendum)
It was great meeting you today   Keep doing whatever you are doing.   I will see you in February for your physical. If you need anything before that, please let me know  Health Maintenance, Male A healthy lifestyle and preventative care can promote health and wellness.  Maintain regular health, dental, and eye exams.  Eat a healthy diet. Foods like vegetables, fruits, whole grains, low-fat dairy products, and lean protein foods contain the nutrients you need and are low in calories. Decrease your intake of foods high in solid fats, added sugars, and salt. Get information about a proper diet from your health care provider, if necessary.  Regular physical exercise is one of the most important things you can do for your health. Most adults should get at least 150 minutes of moderate-intensity exercise (any activity that increases your heart rate and causes you to sweat) each week. In addition, most adults need muscle-strengthening exercises on 2 or more days a week.   Maintain a healthy weight. The body mass index (BMI) is a screening tool to identify possible weight problems. It provides an estimate of body fat based on height and weight. Your health care provider can find your BMI and can help you achieve or maintain a healthy weight. For males 20 years and older:  A BMI below 18.5 is considered underweight.  A BMI of 18.5 to 24.9 is normal.  A BMI of 25 to 29.9 is considered overweight.  A BMI of 30 and above is considered obese.  Maintain normal blood lipids and cholesterol by exercising and minimizing your intake of saturated fat. Eat a balanced diet with plenty of fruits and vegetables. Blood tests for lipids and cholesterol should begin at age 70 and be repeated every 5 years. If your lipid or cholesterol levels are high, you are over age 8, or you are at high risk for heart disease, you may need your cholesterol levels checked more frequently.Ongoing high lipid and cholesterol levels  should be treated with medicines if diet and exercise are not working.  If you smoke, find out from your health care provider how to quit. If you do not use tobacco, do not start.  Lung cancer screening is recommended for adults aged 21-80 years who are at high risk for developing lung cancer because of a history of smoking. A yearly low-dose CT scan of the lungs is recommended for people who have at least a 30-pack-year history of smoking and are current smokers or have quit within the past 15 years. A pack year of smoking is smoking an average of 1 pack of cigarettes a day for 1 year (for example, a 30-pack-year history of smoking could mean smoking 1 pack a day for 30 years or 2 packs a day for 15 years). Yearly screening should continue until the smoker has stopped smoking for at least 15 years. Yearly screening should be stopped for people who develop a health problem that would prevent them from having lung cancer treatment.  If you choose to drink alcohol, do not have more than 2 drinks per day. One drink is considered to be 12 oz (360 mL) of beer, 5 oz (150 mL) of wine, or 1.5 oz (45 mL) of liquor.  Avoid the use of street drugs. Do not share needles with anyone. Ask for help if you need support or instructions about stopping the use of drugs.  High blood pressure causes heart disease and increases the risk of stroke. High blood pressure  is more likely to develop in:  People who have blood pressure in the end of the normal range (100-139/85-89 mm Hg).  People who are overweight or obese.  People who are African American.  If you are 6-3 years of age, have your blood pressure checked every 3-5 years. If you are 40 years of age or older, have your blood pressure checked every year. You should have your blood pressure measured twice--once when you are at a hospital or clinic, and once when you are not at a hospital or clinic. Record the average of the two measurements. To check your blood  pressure when you are not at a hospital or clinic, you can use:  An automated blood pressure machine at a pharmacy.  A home blood pressure monitor.  If you are 72-16 years old, ask your health care provider if you should take aspirin to prevent heart disease.  Diabetes screening involves taking a blood sample to check your fasting blood sugar level. This should be done once every 3 years after age 71 if you are at a normal weight and without risk factors for diabetes. Testing should be considered at a younger age or be carried out more frequently if you are overweight and have at least 1 risk factor for diabetes.  Colorectal cancer can be detected and often prevented. Most routine colorectal cancer screening begins at the age of 82 and continues through age 48. However, your health care provider may recommend screening at an earlier age if you have risk factors for colon cancer. On a yearly basis, your health care provider may provide home test kits to check for hidden blood in the stool. A small camera at the end of a tube may be used to directly examine the colon (sigmoidoscopy or colonoscopy) to detect the earliest forms of colorectal cancer. Talk to your health care provider about this at age 26 when routine screening begins. A direct exam of the colon should be repeated every 5-10 years through age 57, unless early forms of precancerous polyps or small growths are found.  People who are at an increased risk for hepatitis B should be screened for this virus. You are considered at high risk for hepatitis B if:  You were born in a country where hepatitis B occurs often. Talk with your health care provider about which countries are considered high risk.  Your parents were born in a high-risk country and you have not received a shot to protect against hepatitis B (hepatitis B vaccine).  You have HIV or AIDS.  You use needles to inject street drugs.  You live with, or have sex with, someone who  has hepatitis B.  You are a man who has sex with other men (MSM).  You get hemodialysis treatment.  You take certain medicines for conditions like cancer, organ transplantation, and autoimmune conditions.  Hepatitis C blood testing is recommended for all people born from [birth year] through 1963 and any individual with known risk factors for hepatitis C.  Healthy men should no longer receive prostate-specific antigen (PSA) blood tests as part of routine cancer screening. Talk to your health care provider about prostate cancer screening.  Testicular cancer screening is not recommended for adolescents or adult males who have no symptoms. Screening includes self-exam, a health care provider exam, and other screening tests. Consult with your health care provider about any symptoms you have or any concerns you have about testicular cancer.  Practice safe sex. Use condoms and avoid high-risk  sexual practices to reduce the spread of sexually transmitted infections (STIs).  You should be screened for STIs, including gonorrhea and chlamydia if:  You are sexually active and are younger than 24 years.  You are older than 24 years, and your health care provider tells you that you are at risk for this type of infection.  Your sexual activity has changed since you were last screened, and you are at an increased risk for chlamydia or gonorrhea. Ask your health care provider if you are at risk.  If you are at risk of being infected with HIV, it is recommended that you take a prescription medicine daily to prevent HIV infection. This is called pre-exposure prophylaxis (PrEP). You are considered at risk if:  You are a man who has sex with other men (MSM).  You are a heterosexual man who is sexually active with multiple partners.  You take drugs by injection.  You are sexually active with a partner who has HIV.  Talk with your health care provider about whether you are at high risk of being infected with  HIV. If you choose to begin PrEP, you should first be tested for HIV. You should then be tested every 3 months for as long as you are taking PrEP.  Use sunscreen. Apply sunscreen liberally and repeatedly throughout the day. You should seek shade when your shadow is shorter than you. Protect yourself by wearing long sleeves, pants, a wide-brimmed hat, and sunglasses year round whenever you are outdoors.  Tell your health care provider of new moles or changes in moles, especially if there is a change in shape or color. Also, tell your health care provider if a mole is larger than the size of a pencil eraser.  A one-time screening for abdominal aortic aneurysm (AAA) and surgical repair of large AAAs by ultrasound is recommended for men aged 42-75 years who are current or former smokers.  Stay current with your vaccines (immunizations).   This information is not intended to replace advice given to you by your health care provider. Make sure you discuss any questions you have with your health care provider.   Document Released: 03/29/2008 Document Revised: 10/22/2014 Document Reviewed: 02/26/2011 Elsevier Interactive Patient Education [employer].

## 2016-05-22 NOTE — Progress Notes (Signed)
Patient presents to clinic today to establish care. He is a pleasant 75 year old male who  has a past medical history of Adult acne; Allergic rhinitis; Hyperlipidemia; Hypertension; and Osteoarthritis.  His last physical was in 11/2015 with MD Sherren Mocha  Acute Concerns: Establish Care  Chronic Issues: Hypertension  - Well controlled on Lisinopril   Hyperlipidemia  - Well controlled on Zocor   Health Maintenance: Dental -- Routine Vision --Routine Immunizations -- UTD Colonoscopy --2008  Diet: eats healthy - Vegetarian diet and fish Exercise: Plays tennis weekly   He denies any acute complaints today   Past Medical History:  Diagnosis Date  . Adult acne   . Allergic rhinitis   . Hyperlipidemia   . Hypertension   . Osteoarthritis     Past Surgical History:  Procedure Laterality Date  . anal cyst    . APPENDECTOMY    . CATARACT EXTRACTION, BILATERAL    . PARATHYROIDECTOMY N/A 03/10/2015   Procedure: PARATHYROIDECTOMY;  Surgeon: Armandina Gemma, MD;  Location: [place];  Service: General;  Laterality: N/A;    Current Outpatient Prescriptions on File Prior to Visit  Medication Sig Dispense Refill  . Ascorbic Acid (VITAMIN C) 100 MG tablet Take 100 mg by mouth daily.      Marland Kitchen aspirin 81 MG tablet Take 81 mg by mouth daily.      . fish oil-omega-3 fatty acids 1000 MG capsule Take 2 g by mouth daily.    . furosemide (LASIX) 20 MG tablet Take 1 tablet (20 mg total) by mouth daily. 90 tablet 3  . lisinopril (PRINIVIL,ZESTRIL) 10 MG tablet Take 1 tablet (10 mg total) by mouth daily. 90 tablet 3  . loratadine (CLARITIN) 10 MG tablet Take 10 mg by mouth daily.      . meloxicam (MOBIC) 15 MG tablet Take 1 tablet (15 mg total) by mouth daily. 90 tablet 3  . Multiple Vitamin tablet Take 1 a day 30 tablet 0  . simvastatin (ZOCOR) 40 MG tablet Take 1 tablet (40 mg total) by mouth at bedtime. 100 tablet 3   No current facility-administered medications on file prior to visit.      Allergies  Allergen Reactions  . Shrimp Flavor     REACTION: rash    Family History  Problem Relation Age of Onset  . Stroke Mother     Social History   Social History  . Marital status: Married    Spouse name: N/A  . Number of children: N/A  . Years of education: N/A   Occupational History  . Not on file.   Social History Main Topics  . Smoking status: Never Smoker  . Smokeless tobacco: Not on file  . Alcohol use No  . Drug use: No  . Sexual activity: Not on file   Other Topics Concern  . Not on file   Social History Narrative  . No narrative on file    Review of Systems  Respiratory: Negative.   Cardiovascular: Negative.   Genitourinary: Negative.   Musculoskeletal: Negative.   Neurological: Negative.   Psychiatric/Behavioral: Negative.   All other systems reviewed and are negative.   BP 120/80 (BP Location: Right Arm, Patient Position: Sitting, Cuff Size: Normal)   Pulse 73   Temp 98.2 F (36.8 C) (Oral)   Ht 5\' 10"  (1.778 m)   Wt 188 lb 9.6 oz (85.5 kg)   SpO2 95%   BMI 27.06 kg/m   Physical Exam  Constitutional: He is  oriented to person, place, and time and well-developed, well-nourished, and in no distress.  HENT:  Head: Normocephalic and atraumatic.  Right Ear: External ear normal.  Left Ear: External ear normal.  Nose: Nose normal.  Mouth/Throat: Oropharynx is clear and moist. No oropharyngeal exudate.  Cardiovascular: Normal rate, regular rhythm, normal heart sounds and intact distal pulses.  Exam reveals no gallop and no friction rub.   No murmur heard. Pulmonary/Chest: Effort normal and breath sounds normal.  Abdominal: Soft. Bowel sounds are normal. He exhibits no distension. There is no tenderness. There is no rebound and no guarding.  Musculoskeletal: Normal range of motion. He exhibits no edema, tenderness or deformity.  Neurological: He is alert and oriented to person, place, and time. Gait normal. GCS score is 15.  Skin:  Skin is warm and dry. No rash noted. He is not diaphoretic. No erythema. No pallor.  Surgical scars on bilateral knees  Psychiatric: Mood, memory, affect and judgment normal.  Nursing note and vitals reviewed.   Recent Results (from the past 2160 hour(s))  VITAMIN D 25 Hydroxy (Vit-D Deficiency, Fractures)     Status: None   Collection Time: 05/21/16  9:21 AM  Result Value Ref Range   VITD 47.10 30.00 - 100.00 ng/mL  PTH, intact and calcium     Status: None   Collection Time: 05/21/16  9:21 AM  Result Value Ref Range   Calcium 9.7 8.6 - 10.2 mg/dL   PTH 29 15 - 65 pg/mL   PTH Comment     Comment: Interpretation                 Intact PTH    Calcium                                 (pg/mL)      (mg/dL) Normal                          15 - 65     8.6 - 10.2 Primary Hyperparathyroidism         >65          >10.2 Secondary Hyperparathyroidism       >65          <10.2 Non-Parathyroid Hypercalcemia       <65          >10.2 Hypoparathyroidism                  <15          < 8.6 Non-Parathyroid Hypocalcemia    15 - 65          < 8.6     Assessment/Plan: 1. Encounter to establish care - Follow up in February for CPE - Follow up sooner with any acute issues.  - Continue to eat healthy and stay active  2. Hyperlipidemia Lab Results  Component Value Date   CHOL 126 12/01/2015   HDL 32.80 (L) 12/01/2015   LDLCALC 69 12/01/2015   TRIG 125.0 12/01/2015   CHOLHDL 4 12/01/2015  - Well controlled. No change in medications   3. Essential hypertension - BP Readings from Last 3 Encounters:  05/22/16 120/80  05/21/16 120/74  12/12/15 110/80  - No change in medications at this time  - Continue to monitor.     [name], NP

## 2016-09-04 IMAGING — NM NM PARATHYROID W/ SPECT
3 series · 18 of 18 positions shown · non-contrast
Comparison: None

CLINICAL DATA: Evaluate for parathyroid adenoma

EXAM:
NM PARATHYROID SCINTIGRAPHY AND SPECT IMAGING
TECHNIQUE: Following intravenous administration of radiopharmaceutical, early
and 2-hour delayed planar images were obtained in the anterior
projection. Delayed triplanar SPECT images were also obtained at 2
hours.
RADIOPHARMACEUTICALS:  25.0 [id number] Sestamibi IV

[Series 1: spect parathyroid · 8.28mm/px · 6 of 64 frames shown]
[frame 6/64]
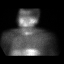
[frame 16/64]
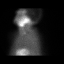
[frame 27/64]
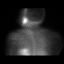
[frame 38/64]
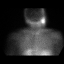
[frame 48/64]
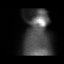
[frame 59/64]
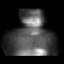

[Series 1: wbr_bone spect parathyroid · 8.3mm · 8.28mm/px · 6 of 64 frames shown]
[frame 6/64]
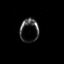
[frame 16/64]
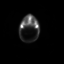
[frame 27/64]
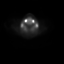
[frame 38/64]
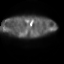
[frame 48/64]
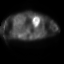
[frame 59/64]
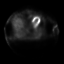

[Series 1: wbr_bone spect parathyroid - spect - (id)_tra · 8.3mm · 8.28mm/px · 6 of 64 frames shown]
[frame 6/64]
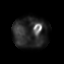
[frame 16/64]
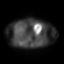
[frame 27/64]
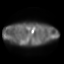
[frame 38/64]
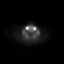
[frame 48/64]
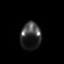
[frame 59/64]
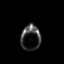

[18 of 18 positions shown; findings below may reference images not displayed]

FINDINGS: On the 15 minutes images there is radiotracer activity within both
lobes of the thyroid gland. On the 1 hour and 2 hour delayed images
there is gradual washout of radiotracer from the right lobe of
thyroid gland. There is a persistent focus of increased radiotracer
uptake localizing to the inferior pole region of the left lobe of
thyroid gland compatible with parathyroid adenoma.
IMPRESSION: 1. Parathyroid adenoma localizes to the inferior pole region of the
left lobe of thyroid gland.

## 2016-10-05 ENCOUNTER — Ambulatory Visit (INDEPENDENT_AMBULATORY_CARE_PROVIDER_SITE_OTHER): Payer: PPO | Admitting: Adult Health

## 2016-10-05 ENCOUNTER — Encounter: Payer: Self-pay | Admitting: Adult Health

## 2016-10-05 VITALS — BP 118/70 | Temp 98.2°F | Ht 70.0 in | Wt 193.2 lb

## 2016-10-05 DIAGNOSIS — L570 Actinic keratosis: Secondary | ICD-10-CM

## 2016-10-05 DIAGNOSIS — D492 Neoplasm of unspecified behavior of bone, soft tissue, and skin: Secondary | ICD-10-CM

## 2016-10-05 DIAGNOSIS — L821 Other seborrheic keratosis: Secondary | ICD-10-CM

## 2016-10-05 DIAGNOSIS — L02212 Cutaneous abscess of back [any part, except buttock]: Secondary | ICD-10-CM | POA: Diagnosis not present

## 2016-10-05 MED ORDER — CEPHALEXIN 500 MG PO CAPS: 500.0000 mg | ORAL_CAPSULE | Freq: Two times a day (BID) | ORAL | 0 refills | Status: AC

## 2016-10-05 NOTE — Progress Notes (Signed)
Pre visit review using our clinic review tool, if applicable. No additional management support is needed unless otherwise documented below in the visit note. 

## 2016-10-09 NOTE — Progress Notes (Signed)
Subjective:    Patient ID: Paul Lowe, male    DOB: Feb 27, 1941, 75 y.o.   MRN: [id number]  HPI  75 year old male who presents to the office today for two separate skin complaints.   1. He has a recurring boil on his mid upper back. He first noticed that " it started acting up again" about a week ago. He has been applying heat. He has not noticed any drainage. Pain is minimal. He would like to have the abscess drained.   2. He has a growth on his right upper arm that he would like to have evaluated. He does not have a dermatologist. He has not felt like this growth has grown considerably over the course of time. This area will become irritated and sore from time to time. Denies any drainage or signs of infection from this growth.   Review of Systems  Constitutional: Negative.   Skin: Positive for color change and wound.   Past Medical History:  Diagnosis Date  . Adult acne   . Allergic rhinitis   . Hyperlipidemia   . Hypertension   . Osteoarthritis     Social History   Social History  . Marital status: Married    Spouse name: N/A  . Number of children: N/A  . Years of education: N/A   Occupational History  . Not on file.   Social History Main Topics  . Smoking status: Never Smoker  . Smokeless tobacco: Not on file  . Alcohol use No  . Drug use: No  . Sexual activity: Not on file   Other Topics Concern  . 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 anal cyst    . APPENDECTOMY    . CATARACT EXTRACTION, BILATERAL    . PARATHYROIDECTOMY N/A 03/10/2015   Procedure: PARATHYROIDECTOMY;  Surgeon: Armandina Gemma, MD;  Location: [facility];  Service: General;  Laterality: N/A;  . REPLACEMENT TOTAL KNEE Bilateral     Family History  Problem Relation Age of Onset  . Stroke Mother     Allergies  Allergen Reactions   . Shrimp Flavor     REACTION: rash    Current Outpatient Prescriptions on File Prior to Visit  Medication Sig Dispense Refill  . Ascorbic Acid (VITAMIN C) 100 MG tablet Take 100 mg by mouth daily.      Marland Kitchen aspirin 81 MG tablet Take 81 mg by mouth daily.      . fish oil-omega-3 fatty acids 1000 MG capsule Take 2 g by mouth daily.    . furosemide (LASIX) 20 MG tablet Take 1 tablet (20 mg total) by mouth daily. 90 tablet 3  . lisinopril (PRINIVIL,ZESTRIL) 10 MG tablet Take 1 tablet (10 mg total) by mouth daily. 90 tablet 3  . loratadine (CLARITIN) 10 MG tablet Take 10 mg by mouth daily.      . meloxicam (MOBIC) 15 MG tablet Take 1 tablet (15 mg total) by mouth daily. 90 tablet 3  . Multiple Vitamin tablet Take 1 a day 30 tablet 0  . simvastatin (ZOCOR) 40 MG tablet Take 1 tablet (40 mg total) by mouth at bedtime. 100 tablet 3   No current facility-administered medications on file prior to  visit.     BP 118/70   Temp 98.2 F (36.8 C) (Oral)   Ht 5\' 10"  (1.778 m)   Wt 193 lb 3.2 oz (87.6 kg)   BMI 27.72 kg/m       Objective:   Physical Exam  Constitutional: He is oriented to person, place, and time. He appears well-developed and well-nourished. No distress.  Cardiovascular: Normal rate, regular rhythm, normal heart sounds and intact distal pulses.  Exam reveals no gallop and no friction rub.   No murmur heard. Pulmonary/Chest: Effort normal and breath sounds normal. No respiratory distress. He has no wheezes. He has no rales. He exhibits no tenderness.  Neurological: He is alert and oriented to person, place, and time.  Skin: Skin is warm and dry. He is not diaphoretic.  -  5 mm x 5 mm Cutaneous abscess noted in mid upper back. Localized redness and discomfort with palpation noted. Abscess is fluctuant. No drainage noted.   - 39mm x 3 mm Neoplasm noted on right upper arm. Possible BCC? No redness or irritation noted   Psychiatric: He has a normal mood and affect. His behavior is  normal. Judgment and thought content normal.  Nursing note and vitals reviewed.     Assessment & Plan:  1. Cutaneous abscess of back excluding buttocks Procedure:  Incision and drainage of abscess Risks, benefits, and alternatives explained and consent obtained. Time out conducted. Surface cleaned with alcohol.  1 cc lidocaine with epinephine infiltrated around abscess. Adequate anesthesia ensured. Area prepped and draped in a sterile fashion. #11 blade used to make a stab incision into abscess. Pus expressed with pressure. Curved hemostat used to explore 4 quadrants and loculations broken up. Further purulence expressed. Hemostasis achieved. Pt stable. Aftercare and follow-up advised. - cephALEXin (KEFLEX) 500 MG capsule; Take 1 capsule (500 mg total) by mouth 2 (two) times daily.  Dispense: 20 capsule; Refill: 0  2. Skin neoplasm - Procedure including risks/benefits explained to patient.  Questions were answered. After informed consent was obtained and a time out completed, site was cleansed with betadine and then alcohol. 1% Lidocaine with epinephrine was injected under lesion and then shave biopsy was performed. Area was cauterized to obtain hemostasis.  Pt tolerated procedure well.  Specimen sent for pathology review.  Pt instructed to keep the area dry for 24 hours and to contact us if he develops redness, drainage or swelling at the site.  Pt may use tylenol as needed for discomfort today.  - Dermatology pathology - Consider referral to skin surgery center   [name], NP

## 2016-10-11 DIAGNOSIS — Z961 Presence of intraocular lens: Secondary | ICD-10-CM | POA: Diagnosis not present

## 2016-10-11 DIAGNOSIS — Z9849 Cataract extraction status, unspecified eye: Secondary | ICD-10-CM | POA: Diagnosis not present

## 2016-10-11 DIAGNOSIS — H5213 Myopia, bilateral: Secondary | ICD-10-CM | POA: Diagnosis not present

## 2016-10-11 DIAGNOSIS — H52221 Regular astigmatism, right eye: Secondary | ICD-10-CM | POA: Diagnosis not present

## 2016-10-11 DIAGNOSIS — H524 Presbyopia: Secondary | ICD-10-CM | POA: Diagnosis not present

## 2016-11-20 ENCOUNTER — Ambulatory Visit (INDEPENDENT_AMBULATORY_CARE_PROVIDER_SITE_OTHER): Payer: PPO | Admitting: Adult Health

## 2016-11-20 VITALS — BP 126/84 | HR 67 | Wt 194.0 lb

## 2016-11-20 DIAGNOSIS — E210 Primary hyperparathyroidism: Secondary | ICD-10-CM | POA: Diagnosis not present

## 2016-11-20 DIAGNOSIS — E785 Hyperlipidemia, unspecified: Secondary | ICD-10-CM | POA: Diagnosis not present

## 2016-11-20 DIAGNOSIS — Z0000 Encounter for general adult medical examination without abnormal findings: Secondary | ICD-10-CM

## 2016-11-20 DIAGNOSIS — Z125 Encounter for screening for malignant neoplasm of prostate: Secondary | ICD-10-CM | POA: Diagnosis not present

## 2016-11-20 DIAGNOSIS — I10 Essential (primary) hypertension: Secondary | ICD-10-CM

## 2016-11-20 LAB — CBC WITH DIFFERENTIAL/PLATELET
BASOS PCT: 0.8 % (ref 0.0–3.0)
Basophils Absolute: 0.1 10*3/uL (ref 0.0–0.1)
EOS ABS: 0.4 10*3/uL (ref 0.0–0.7)
Eosinophils Relative: 6.5 % — ABNORMAL HIGH (ref 0.0–5.0)
HCT: 46.2 % (ref 39.0–52.0)
Hemoglobin: 15.9 g/dL (ref 13.0–17.0)
Lymphocytes Relative: 23.4 % (ref 12.0–46.0)
Lymphs Abs: 1.5 10*3/uL (ref 0.7–4.0)
MCHC: 34.4 g/dL (ref 30.0–36.0)
MCV: 95.1 fl (ref 78.0–100.0)
MONO ABS: 0.5 10*3/uL (ref 0.1–1.0)
Monocytes Relative: 8.3 % (ref 3.0–12.0)
NEUTROS ABS: 4 10*3/uL (ref 1.4–7.7)
Neutrophils Relative %: 61 % (ref 43.0–77.0)
PLATELETS: 170 10*3/uL (ref 150.0–400.0)
RBC: 4.86 Mil/uL (ref 4.22–5.81)
RDW: 12.9 % (ref 11.5–15.5)
WBC: 6.5 10*3/uL (ref 4.0–10.5)

## 2016-11-20 LAB — HEPATIC FUNCTION PANEL
ALBUMIN: 4 g/dL (ref 3.5–5.2)
ALK PHOS: 65 U/L (ref 39–117)
ALT: 20 U/L (ref 0–53)
AST: 19 U/L (ref 0–37)
BILIRUBIN DIRECT: 0.1 mg/dL (ref 0.0–0.3)
Total Bilirubin: 0.6 mg/dL (ref 0.2–1.2)
Total Protein: 6.7 g/dL (ref 6.0–8.3)

## 2016-11-20 LAB — BASIC METABOLIC PANEL
BUN: 16 mg/dL (ref 6–23)
CHLORIDE: 106 meq/L (ref 96–112)
CO2: 28 meq/L (ref 19–32)
CREATININE: 1.28 mg/dL (ref 0.40–1.50)
Calcium: 9.4 mg/dL (ref 8.4–10.5)
GFR: 58.39 mL/min — ABNORMAL LOW (ref >60.00)
Glucose, Bld: 95 mg/dL (ref 70–99)
Potassium: 4.9 mEq/L (ref 3.5–5.1)
Sodium: 140 mEq/L (ref 135–145)

## 2016-11-20 LAB — LIPID PANEL
CHOLESTEROL: 116 mg/dL (ref 0–200)
HDL: 36.3 mg/dL — ABNORMAL LOW (ref >39.00)
LDL Cholesterol: 59 mg/dL (ref 0–99)
NonHDL: 79.48
TRIGLYCERIDES: 102 mg/dL (ref 0.0–149.0)
Total CHOL/HDL Ratio: 3
VLDL: 20.4 mg/dL (ref 0.0–40.0)

## 2016-11-20 LAB — POC URINALSYSI DIPSTICK (AUTOMATED)
Bilirubin, UA: NEGATIVE
Blood, UA: NEGATIVE
GLUCOSE UA: NEGATIVE
Leukocytes, UA: NEGATIVE
NITRITE UA: NEGATIVE
Protein, UA: NEGATIVE
Spec Grav, UA: 1.025
UROBILINOGEN UA: 0.2
pH, UA: 6

## 2016-11-20 LAB — PSA: PSA: 2.79 ng/mL (ref 0.10–4.00)

## 2016-11-20 LAB — TSH: TSH: 1.12 u[IU]/mL (ref 0.35–4.50)

## 2016-11-20 NOTE — Progress Notes (Signed)
Subjective:    Patient ID: Paul Lowe, male    DOB: [birth year], 76 y.o.   MRN: [id number]  HPI  Patient presents for yearly preventative medicine examination. He is a pleasant 76 year old male who  has a past medical history of Adult acne; Allergic rhinitis; Hyperlipidemia; Hypertension; and Osteoarthritis.  All immunizations and health maintenance protocols were reviewed with the patient and needed orders were placed.  Appropriate screening laboratory values were ordered for the patient including screening of hyperlipidemia, renal function and hepatic function. If indicated by BPH, a PSA was ordered.  Medication reconciliation,  past medical history, social history, problem list and allergies were reviewed in detail with the patient  Goals were established with regard to weight loss, exercise, and  diet in compliance with medications. He eats a vegetarian diet and continues to play tennis weekly  End of life planning was discussed. He has an advanced directive and living will  He takes Zocor and 81 mg aspirin for hyperlipidemia   He takes lisinopril 20 mg for hyperlipidemia and feels as though this is well controlled  Is due for his colonoscopy this year. He is up-to-date on dental and vision exams.   Review of Systems  Constitutional: Negative.   HENT: Negative.   Eyes: Negative.   Respiratory: Negative.   Cardiovascular: Negative.   Gastrointestinal: Negative.   Endocrine: Negative.   Genitourinary: Negative.   Musculoskeletal: Negative.   Skin: Negative.   Allergic/Immunologic: Negative.   Neurological: Negative.   Hematological: Negative.   Psychiatric/Behavioral: Negative.   All other systems reviewed and are negative.  Past Medical History:  Diagnosis Date  . Adult acne   . Allergic rhinitis   . Hyperlipidemia   . Hypertension   . Osteoarthritis     Social History   Social History  . Marital status: Married    Spouse name: N/A  . Number of  children: N/A  . Years of education: N/A   Occupational History  . Not on file.   Social History Main Topics  . Smoking status: Never Smoker  . Smokeless tobacco: Not on file  . Alcohol use No  . Drug use: No  . Sexual activity: Not on file   Other Topics Concern  . 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 anal cyst    . APPENDECTOMY    . CATARACT EXTRACTION, BILATERAL    . PARATHYROIDECTOMY N/A 03/10/2015   Procedure: PARATHYROIDECTOMY;  Surgeon: Armandina Gemma, MD;  Location: [facility];  Service: General;  Laterality: N/A;  . REPLACEMENT TOTAL KNEE Bilateral     Family History  Problem Relation Age of Onset  . Stroke Mother     Allergies  Allergen Reactions  . Shrimp Flavor     REACTION: rash    Current Outpatient Prescriptions on File Prior to Visit  Medication Sig Dispense Refill  . Ascorbic Acid (VITAMIN C) 100 MG tablet Take 100 mg by mouth daily.      Marland Kitchen aspirin 81 MG tablet Take 81 mg by mouth daily.      . fish oil-omega-3 fatty acids 1000 MG capsule Take 2 g by mouth daily.    . furosemide (LASIX) 20 MG tablet Take 1  tablet (20 mg total) by mouth daily. 90 tablet 3  . lisinopril (PRINIVIL,ZESTRIL) 10 MG tablet Take 1 tablet (10 mg total) by mouth daily. 90 tablet 3  . loratadine (CLARITIN) 10 MG tablet Take 10 mg by mouth daily.      . meloxicam (MOBIC) 15 MG tablet Take 1 tablet (15 mg total) by mouth daily. 90 tablet 3  . Multiple Vitamin tablet Take 1 a day 30 tablet 0  . simvastatin (ZOCOR) 40 MG tablet Take 1 tablet (40 mg total) by mouth at bedtime. 100 tablet 3   No current facility-administered medications on file prior to visit.     There were no vitals taken for this visit.      Objective:   Physical Exam  Constitutional: He is oriented to person, place, and time. He appears well-developed  and well-nourished. No distress.  HENT:  Head: Normocephalic and atraumatic.  Right Ear: External ear normal.  Left Ear: External ear normal.  Nose: Nose normal.  Mouth/Throat: Oropharynx is clear and moist. No oropharyngeal exudate.  Eyes: Conjunctivae and EOM are normal. Pupils are equal, round, and reactive to light. Right eye exhibits no discharge. Left eye exhibits no discharge. No scleral icterus.  Neck: Normal range of motion. Neck supple. No JVD present. Carotid bruit is not present. No tracheal deviation present. No thyromegaly present.  Cardiovascular: Normal rate, regular rhythm, normal heart sounds and intact distal pulses.  Exam reveals no gallop and no friction rub.   No murmur heard. Pulmonary/Chest: Effort normal and breath sounds normal. No stridor. No respiratory distress. He has no wheezes. He has no rales. He exhibits no tenderness.  Abdominal: Soft. Bowel sounds are normal. He exhibits no distension and no mass. There is no tenderness. There is no rebound and no guarding.  Musculoskeletal: Normal range of motion. He exhibits no edema, tenderness or deformity.  Lymphadenopathy:    He has no cervical adenopathy.  Neurological: He is alert and oriented to person, place, and time. He has normal reflexes. He displays normal reflexes. No cranial nerve deficit. He exhibits normal muscle tone. Coordination normal.  Skin: Skin is warm and dry. No rash noted. He is not diaphoretic. No erythema. No pallor.  Psychiatric: He has a normal mood and affect. His behavior is normal. Judgment and thought content normal.  Nursing note and vitals reviewed.     Assessment & Plan:   1. Routine general medical examination at a health care facility - Follow up in one year or sooner if needed - Continue to eat healthy and get regular exercise - Basic metabolic panel - CBC with Differential/Platelet - Lipid panel - POCT Urinalysis Dipstick (Automated) - PSA - TSH - Hepatic function  panel  2. Essential hypertension, benign - Well controlled. No change in medicatin  - Basic metabolic panel - CBC with Differential/Platelet - Lipid panel - POCT Urinalysis Dipstick (Automated) - PSA - TSH - Hepatic function panel  3. Hyperlipidemia, unspecified hyperlipidemia type  - Basic metabolic panel - CBC with Differential/Platelet - Lipid panel - POCT Urinalysis Dipstick (Automated) - PSA - TSH - Hepatic function panel - Consider increasing statin  4. Hyperparathyroidism, primary ([place])  - PTH, Intact and Calcium  [name], NP

## 2016-11-20 NOTE — Patient Instructions (Signed)
It was great seeing you today!  Your exam was great. I will follow up with you regarding your lab work.   Please see me in one year or sooner if needed   Health Maintenance, Male A healthy lifestyle and preventative care can promote health and wellness.  Maintain regular health, dental, and eye exams.  Eat a healthy diet. Foods like vegetables, fruits, whole grains, low-fat dairy products, and lean protein foods contain the nutrients you need and are low in calories. Decrease your intake of foods high in solid fats, added sugars, and salt. Get information about a proper diet from your health care provider, if necessary.  Regular physical exercise is one of the most important things you can do for your health. Most adults should get at least 150 minutes of moderate-intensity exercise (any activity that increases your heart rate and causes you to sweat) each week. In addition, most adults need muscle-strengthening exercises on 2 or more days a week.   Maintain a healthy weight. The body mass index (BMI) is a screening tool to identify possible weight problems. It provides an estimate of body fat based on height and weight. Your health care provider can find your BMI and can help you achieve or maintain a healthy weight. For males 20 years and older:  A BMI below 18.5 is considered underweight.  A BMI of 18.5 to 24.9 is normal.  A BMI of 25 to 29.9 is considered overweight.  A BMI of 30 and above is considered obese.  Maintain normal blood lipids and cholesterol by exercising and minimizing your intake of saturated fat. Eat a balanced diet with plenty of fruits and vegetables. Blood tests for lipids and cholesterol should begin at age 62 and be repeated every 5 years. If your lipid or cholesterol levels are high, you are over age 16, or you are at high risk for heart disease, you may need your cholesterol levels checked more frequently.Ongoing high lipid and cholesterol levels should be  treated with medicines if diet and exercise are not working.  If you smoke, find out from your health care provider how to quit. If you do not use tobacco, do not start.  Lung cancer screening is recommended for adults aged 48-80 years who are at high risk for developing lung cancer because of a history of smoking. A yearly low-dose CT scan of the lungs is recommended for people who have at least a 30-pack-year history of smoking and are current smokers or have quit within the past 15 years. A pack year of smoking is smoking an average of 1 pack of cigarettes a day for 1 year (for example, a 30-pack-year history of smoking could mean smoking 1 pack a day for 30 years or 2 packs a day for 15 years). Yearly screening should continue until the smoker has stopped smoking for at least 15 years. Yearly screening should be stopped for people who develop a health problem that would prevent them from having lung cancer treatment.  If you choose to drink alcohol, do not have more than 2 drinks per day. One drink is considered to be 12 oz (360 mL) of beer, 5 oz (150 mL) of wine, or 1.5 oz (45 mL) of liquor.  Avoid the use of street drugs. Do not share needles with anyone. Ask for help if you need support or instructions about stopping the use of drugs.  High blood pressure causes heart disease and increases the risk of stroke. High blood pressure is  more likely to develop in:  People who have blood pressure in the end of the normal range (100-139/85-89 mm Hg).  People who are overweight or obese.  People who are African American.  If you are 17-69 years of age, have your blood pressure checked every 3-5 years. If you are 12 years of age or older, have your blood pressure checked every year. You should have your blood pressure measured twice--once when you are at a hospital or clinic, and once when you are not at a hospital or clinic. Record the average of the two measurements. To check your blood pressure  when you are not at a hospital or clinic, you can use:  An automated blood pressure machine at a pharmacy.  A home blood pressure monitor.  If you are 75-64 years old, ask your health care provider if you should take aspirin to prevent heart disease.  Diabetes screening involves taking a blood sample to check your fasting blood sugar level. This should be done once every 3 years after age 40 if you are at a normal weight and without risk factors for diabetes. Testing should be considered at a younger age or be carried out more frequently if you are overweight and have at least 1 risk factor for diabetes.  Colorectal cancer can be detected and often prevented. Most routine colorectal cancer screening begins at the age of 38 and continues through age 53. However, your health care provider may recommend screening at an earlier age if you have risk factors for colon cancer. On a yearly basis, your health care provider may provide home test kits to check for hidden blood in the stool. A small camera at the end of a tube may be used to directly examine the colon (sigmoidoscopy or colonoscopy) to detect the earliest forms of colorectal cancer. Talk to your health care provider about this at age 70 when routine screening begins. A direct exam of the colon should be repeated every 5-10 years through age 26, unless early forms of precancerous polyps or small growths are found.  People who are at an increased risk for hepatitis B should be screened for this virus. You are considered at high risk for hepatitis B if:  You were born in a country where hepatitis B occurs often. Talk with your health care provider about which countries are considered high risk.  Your parents were born in a high-risk country and you have not received a shot to protect against hepatitis B (hepatitis B vaccine).  You have HIV or AIDS.  You use needles to inject street drugs.  You live with, or have sex with, someone who has  hepatitis B.  You are a man who has sex with other men (MSM).  You get hemodialysis treatment.  You take certain medicines for conditions like cancer, organ transplantation, and autoimmune conditions.  Hepatitis C blood testing is recommended for all people born from [birth year] through 1963 and any individual with known risk factors for hepatitis C.  Healthy men should no longer receive prostate-specific antigen (PSA) blood tests as part of routine cancer screening. Talk to your health care provider about prostate cancer screening.  Testicular cancer screening is not recommended for adolescents or adult males who have no symptoms. Screening includes self-exam, a health care provider exam, and other screening tests. Consult with your health care provider about any symptoms you have or any concerns you have about testicular cancer.  Practice safe sex. Use condoms and avoid high-risk sexual  practices to reduce the spread of sexually transmitted infections (STIs).  You should be screened for STIs, including gonorrhea and chlamydia if:  You are sexually active and are younger than 24 years.  You are older than 24 years, and your health care provider tells you that you are at risk for this type of infection.  Your sexual activity has changed since you were last screened, and you are at an increased risk for chlamydia or gonorrhea. Ask your health care provider if you are at risk.  If you are at risk of being infected with HIV, it is recommended that you take a prescription medicine daily to prevent HIV infection. This is called pre-exposure prophylaxis (PrEP). You are considered at risk if:  You are a man who has sex with other men (MSM).  You are a heterosexual man who is sexually active with multiple partners.  You take drugs by injection.  You are sexually active with a partner who has HIV.  Talk with your health care provider about whether you are at high risk of being infected with HIV. If  you choose to begin PrEP, you should first be tested for HIV. You should then be tested every 3 months for as long as you are taking PrEP.  Use sunscreen. Apply sunscreen liberally and repeatedly throughout the day. You should seek shade when your shadow is shorter than you. Protect yourself by wearing long sleeves, pants, a wide-brimmed hat, and sunglasses year round whenever you are outdoors.  Tell your health care provider of new moles or changes in moles, especially if there is a change in shape or color. Also, tell your health care provider if a mole is larger than the size of a pencil eraser.  A one-time screening for abdominal aortic aneurysm (AAA) and surgical repair of large AAAs by ultrasound is recommended for men aged 6-75 years who are current or former smokers.  Stay current with your vaccines (immunizations).   This information is not intended to replace advice given to you by your health care provider. Make sure you discuss any questions you have with your health care provider.   Document Released: 03/29/2008 Document Revised: 10/22/2014 Document Reviewed: 02/26/2011 Elsevier Interactive Patient Education [employer].

## 2016-11-21 LAB — PTH, INTACT AND CALCIUM
Calcium: 9.4 mg/dL (ref 8.6–10.3)
PTH: 57 pg/mL (ref 14–64)

## 2016-12-12 ENCOUNTER — Telehealth: Payer: Self-pay | Admitting: Adult Health

## 2016-12-12 NOTE — Telephone Encounter (Signed)
Pt would like blood work result  °

## 2016-12-12 NOTE — Telephone Encounter (Signed)
Patient notified of Cory's comments and verbalized understanding.  

## 2016-12-12 NOTE — Telephone Encounter (Signed)
He is due in august. He should get a letter in the mail a few months prior

## 2016-12-12 NOTE — Telephone Encounter (Signed)
Labs are all normal.

## 2016-12-12 NOTE — Telephone Encounter (Signed)
I see where labs were completed, but not resulted. Would you mind taking a look at this please?

## 2016-12-12 NOTE — Telephone Encounter (Signed)
I contacted patient of these results. He verbalized understanding.  He states he would also like an order placed for a colonoscopy. Is this ok?

## 2017-02-01 ENCOUNTER — Other Ambulatory Visit: Payer: Self-pay | Admitting: Family Medicine

## 2017-03-21 ENCOUNTER — Encounter: Payer: Self-pay | Admitting: Gastroenterology

## 2017-03-23 ENCOUNTER — Other Ambulatory Visit: Payer: Self-pay | Admitting: Family Medicine

## 2017-03-23 DIAGNOSIS — E785 Hyperlipidemia, unspecified: Secondary | ICD-10-CM

## 2017-03-25 NOTE — Telephone Encounter (Signed)
Ok to refill for one year  

## 2017-04-04 ENCOUNTER — Encounter: Payer: Self-pay | Admitting: Gastroenterology

## 2017-05-09 ENCOUNTER — Ambulatory Visit (AMBULATORY_SURGERY_CENTER): Payer: Self-pay | Admitting: *Deleted

## 2017-05-09 VITALS — Ht 70.0 in | Wt 191.6 lb

## 2017-05-09 DIAGNOSIS — Z1211 Encounter for screening for malignant neoplasm of colon: Secondary | ICD-10-CM

## 2017-05-09 MED ORDER — NA SULFATE-K SULFATE-MG SULF 17.5-3.13-1.6 GM/177ML PO SOLN: 1.0000 | Freq: Once | ORAL | 0 refills | Status: AC

## 2017-05-09 NOTE — Progress Notes (Signed)
Denies allergies to eggs or soy products. Denies complications with sedation or anesthesia. Denies O2 use. Denies use of diet or weight loss medications.  Emmi instructions given for colonoscopy.  

## 2017-05-10 ENCOUNTER — Encounter: Payer: Self-pay | Admitting: Gastroenterology

## 2017-05-20 ENCOUNTER — Encounter: Payer: Self-pay | Admitting: Gastroenterology

## 2017-05-23 ENCOUNTER — Ambulatory Visit (AMBULATORY_SURGERY_CENTER): Payer: PPO | Admitting: Gastroenterology

## 2017-05-23 ENCOUNTER — Encounter: Payer: Self-pay | Admitting: Gastroenterology

## 2017-05-23 VITALS — BP 112/74 | HR 61 | Temp 98.4°F | Resp 12 | Ht 70.0 in | Wt 194.0 lb

## 2017-05-23 DIAGNOSIS — I10 Essential (primary) hypertension: Secondary | ICD-10-CM | POA: Diagnosis not present

## 2017-05-23 DIAGNOSIS — Z1212 Encounter for screening for malignant neoplasm of rectum: Secondary | ICD-10-CM

## 2017-05-23 DIAGNOSIS — E039 Hypothyroidism, unspecified: Secondary | ICD-10-CM | POA: Diagnosis not present

## 2017-05-23 DIAGNOSIS — Z1211 Encounter for screening for malignant neoplasm of colon: Secondary | ICD-10-CM

## 2017-05-23 DIAGNOSIS — E669 Obesity, unspecified: Secondary | ICD-10-CM | POA: Diagnosis not present

## 2017-05-23 MED ORDER — SODIUM CHLORIDE 0.9 % IV SOLN: 500.0000 mL | INTRAVENOUS | Status: DC

## 2017-05-23 NOTE — Progress Notes (Signed)
Report to PACU, RN, vss, BBS= Clear.  

## 2017-05-23 NOTE — Op Note (Signed)
[place] Patient Name: Paul Lowe Procedure Date: 05/23/2017 11:15 AM MRN: [id number] Endoscopist: Mallie Mussel L. Loletha Carrow , MD Age: 76 Referring MD:  Date of Birth: [birth year] Gender: Male Account #: [id number] Procedure:                Colonoscopy Indications:              Screening for colorectal malignant neoplasm (no                            adenomatous polyps 05/2007) Medicines:                Monitored Anesthesia Care Procedure:                Pre-Anesthesia Assessment:                           - Prior to the procedure, a History and Physical                            was performed, and patient medications and                            allergies were reviewed. The patient's tolerance of                            previous anesthesia was also reviewed. The risks                            and benefits of the procedure and the sedation                            options and risks were discussed with the patient.                            All questions were answered, and informed consent                            was obtained. Anticoagulants: The patient has taken                            aspirin. It was decided not to withhold this                            medication prior to the procedure. ASA Grade                            Assessment: II - A patient with mild systemic                            disease. After reviewing the risks and benefits,                            the patient was deemed in satisfactory condition to  undergo the procedure.                           After obtaining informed consent, the colonoscope                            was passed under direct vision. Throughout the                            procedure, the patient's blood pressure, pulse, and                            oxygen saturations were monitored continuously. The                            Model CF-HQ190L [id number]) scope was introduced                   through the anus and advanced to the the cecum,                            identified by appendiceal orifice and ileocecal                            valve. The colonoscopy was performed without                            difficulty. The patient tolerated the procedure                            well. The quality of the bowel preparation was                            good. The ileocecal valve, appendiceal orifice, and                            rectum were photographed. The quality of the bowel                            preparation was evaluated using the BBPS [facility]                            Bowel Preparation Scale) with scores of: Right                            Colon = 2, Transverse Colon = 3 and Left Colon = 2.                            The total BBPS score equals 7. The bowel                            preparation used was SUPREP. Scope In: 11:19:19 AM Scope Out: 11:32:31 AM Scope Withdrawal Time: 0 hours 9 minutes 26 seconds  Total Procedure Duration: 0 hours 13 minutes 12 seconds  Findings:                 The perianal and digital rectal examinations were                            normal.                           Many large-mouthed diverticula were found in the                            entire colon.                           Internal hemorrhoids were found. The hemorrhoids                            were medium-sized and Grade I (internal hemorrhoids                            that do not prolapse).                           The exam was otherwise without abnormality on                            direct and retroflexion views. Complications:            No immediate complications. Estimated Blood Loss:     Estimated blood loss: none. Impression:               - Diverticulosis in the entire examined colon.                           - Internal hemorrhoids.                           - The examination was otherwise normal on direct                             and retroflexion views.                           - No specimens collected. Recommendation:           - Patient has a contact number available for                            emergencies. The signs and symptoms of potential                            delayed complications were discussed with the                            patient. Return to normal activities tomorrow.                            Written discharge instructions were provided to the  patient.                           - Resume previous diet.                           - Continue present medications.                           - No repeat screening colonoscopy due to age. Charbel Los L. Loletha Carrow, MD 05/23/2017 11:36:06 AM This report has been signed electronically.

## 2017-05-23 NOTE — Progress Notes (Signed)
Pt's states no medical or surgical changes since previsit or office visit. 

## 2017-05-23 NOTE — Patient Instructions (Signed)
Discharge instructions given. Handouts on diverticulosis and hemorrhoids. Resume previous medications. YOU HAD AN ENDOSCOPIC PROCEDURE TODAY AT THE  ENDOSCOPY CENTER:   Refer to the procedure report that was given to you for any specific questions about what was found during the examination.  If the procedure report does not answer your questions, please call your gastroenterologist to clarify.  If you requested that your care partner not be given the details of your procedure findings, then the procedure report has been included in a sealed envelope for you to review at your convenience later.  YOU SHOULD EXPECT: Some feelings of bloating in the abdomen. Passage of more gas than usual.  Walking can help get rid of the air that was put into your GI tract during the procedure and reduce the bloating. If you had a lower endoscopy (such as a colonoscopy or flexible sigmoidoscopy) you may notice spotting of blood in your stool or on the toilet paper. If you underwent a bowel prep for your procedure, you may not have a normal bowel movement for a few days.  Please Note:  You might notice some irritation and congestion in your nose or some drainage.  This is from the oxygen used during your procedure.  There is no need for concern and it should clear up in a day or so.  SYMPTOMS TO REPORT IMMEDIATELY:   Following lower endoscopy (colonoscopy or flexible sigmoidoscopy):  Excessive amounts of blood in the stool  Significant tenderness or worsening of abdominal pains  Swelling of the abdomen that is new, acute  Fever of 100F or higher   For urgent or emergent issues, a gastroenterologist can be reached at any hour by calling [phone].   DIET:  We do recommend a small meal at first, but then you may proceed to your regular diet.  Drink plenty of fluids but you should avoid alcoholic beverages for 24 hours.  ACTIVITY:  You should plan to take it easy for the rest of today and you should  NOT DRIVE or use heavy machinery until tomorrow (because of the sedation medicines used during the test).    FOLLOW UP: Our staff will call the number listed on your records the next business day following your procedure to check on you and address any questions or concerns that you may have regarding the information given to you following your procedure. If we do not reach you, we will leave a message.  However, if you are feeling well and you are not experiencing any problems, there is no need to return our call.  We will assume that you have returned to your regular daily activities without incident.  If any biopsies were taken you will be contacted by phone or by letter within the next 1-3 weeks.  Please call us at [phone] if you have not heard about the biopsies in 3 weeks.    SIGNATURES/CONFIDENTIALITY: You and/or your care partner have signed paperwork which will be entered into your electronic medical record.  These signatures attest to the fact that that the information above on your After Visit Summary has been reviewed and is understood.  Full responsibility of the confidentiality of this discharge information lies with you and/or your care-partner. 

## 2017-05-24 ENCOUNTER — Telehealth: Payer: Self-pay

## 2017-05-24 NOTE — Telephone Encounter (Signed)
  Follow up Call-  Call back number 05/23/2017  Post procedure Call Back phone  # [phone]  Permission to leave phone message Yes  Some recent data might be hidden     Patient questions:  Do you have a fever, pain , or abdominal swelling? No. Pain Score  0 *  Have you tolerated food without any problems? Yes.    Have you been able to return to your normal activities? Yes.    Do you have any questions about your discharge instructions: Diet   No. Medications  No. Follow up visit  No.  Do you have questions or concerns about your Care? No.  Actions: * If pain score is 4 or above: No action needed, pain <4.

## 2017-07-05 ENCOUNTER — Encounter: Payer: Self-pay | Admitting: Adult Health

## 2017-07-12 ENCOUNTER — Ambulatory Visit (HOSPITAL_COMMUNITY)
Admission: EM | Admit: 2017-07-12 | Discharge: 2017-07-12 | Disposition: A | Discharge status: Home / Self Care | Payer: PPO | Attending: Internal Medicine | Admitting: Internal Medicine

## 2017-07-12 ENCOUNTER — Encounter (HOSPITAL_COMMUNITY): Payer: Self-pay | Admitting: Family Medicine

## 2017-07-12 DIAGNOSIS — R21 Rash and other nonspecific skin eruption: Secondary | ICD-10-CM

## 2017-07-12 DIAGNOSIS — H6123 Impacted cerumen, bilateral: Secondary | ICD-10-CM | POA: Diagnosis not present

## 2017-07-12 NOTE — ED Triage Notes (Signed)
Pt here for ear wax removal

## 2017-07-12 NOTE — Discharge Instructions (Signed)
Nice to meet you. Ears now look great. The mild rash looks like an irritation from something, wether it is something like detergent or soap it does not look like anything harmful, but certainly if continues or worsens may be worth while to get a Dermatology evaluation. For now suggest use of cortisone cream OTC to area. FU as needed.

## 2017-07-12 NOTE — ED Provider Notes (Signed)
[facility]    CSN: [id number] Arrival date & time: 07/12/17  1328     History   Chief Complaint Chief Complaint  Patient presents with  . Cerumen Impaction    HPI Paul Lowe is a 76 y.o. male.   76 yo male presents with wax removal. He reports having a once a year visit to have this performed. No pain only a clogged feeling.  He also would like me to "peek at his back" for a pruritis. Onset a few weeks. Only on back without any known exposures.       Past Medical History:  Diagnosis Date  . Adult acne   . Allergic rhinitis   . Asthma   . Cataracts, bilateral   . Hyperlipidemia   . Hypertension   . Osteoarthritis   . Seasonal allergies   . Seizure ([place]) 1990's   no seizure activity since    Patient Active Problem List   Diagnosis Date Noted  . Routine general medical examination at a health care facility 12/12/2015  . Hyperparathyroidism, primary ([place]) 03/09/2015  . Mucoid cyst of joint 01/27/2015  . Infected sebaceous cyst 01/27/2015  . Hearing loss associated with syndrome of both ears 11/16/2014  . Hypercalcemia 11/12/2014  . BPH associated with nocturia 09/30/2014  . Infected sebaceous cyst of skin 10/28/2012  . Seborrheic keratosis, inflamed 08/12/2012  . Essential hypertension, benign 05/24/2009  . Osteoarthritis 05/20/2008  . Hyperlipidemia 05/12/2007  . ALLERGIC RHINITIS 04/24/2007  . ACNE ROSACEA 04/24/2007    Past Surgical History:  Procedure Laterality Date  . anal cyst    . APPENDECTOMY    . CATARACT EXTRACTION, BILATERAL    . PARATHYROIDECTOMY N/A 03/10/2015   Procedure: PARATHYROIDECTOMY;  Surgeon: Armandina Gemma, MD;  Location: [facility];  Service: General;  Laterality: N/A;  . REPLACEMENT TOTAL KNEE Bilateral        Home Medications    Prior to Admission medications   Medication Sig Start Date End Date Taking? Authorizing Provider  Ascorbic Acid (VITAMIN C) 100 MG tablet Take 100 mg by mouth daily.      [provider]  aspirin 81 MG tablet Take 81 mg by mouth daily.      [provider]  fish oil-omega-3 fatty acids 1000 MG capsule Take 2 g by mouth daily.    [provider]  furosemide (LASIX) 20 MG tablet take 1 tablet by mouth once daily 02/01/17   Nafziger, Tommi Rumps, NP  lisinopril (PRINIVIL,ZESTRIL) 10 MG tablet take 1 tablet by mouth once daily 02/01/17   Nafziger, Tommi Rumps, NP  loratadine (CLARITIN) 10 MG tablet Take 10 mg by mouth daily.      [provider]  meloxicam (MOBIC) 15 MG tablet take 1 tablet by mouth once daily 02/01/17   [name], NP  Multiple Vitamin tablet Take 1 a day 11/12/14   Philemon Kingdom, MD  simvastatin (ZOCOR) 40 MG tablet take 1 tablet by mouth at bedtime 03/26/17   [name], NP    Family History Family History  Problem Relation Age of Onset  . Stroke Mother   . Colon cancer Neg Hx   . Esophageal cancer Neg Hx   . Rectal cancer Neg Hx   . Stomach cancer Neg Hx     Social History Social History  Substance Use Topics  . Smoking status: Never Smoker  . Smokeless tobacco: Never Used  . Alcohol use 1.2 oz/week    2 Cans of beer per  week     Allergies   Shrimp flavor   Review of Systems Review of Systems   Physical Exam Triage Vital Signs ED Triage Vitals [07/12/17 1432]  Enc Vitals Group     BP 105/76     Pulse Rate 68     Resp 18     Temp 98.2 F (36.8 C)     Temp src      SpO2 98 %     Weight      Height      Head Circumference      Peak Flow      Pain Score      Pain Loc      Pain Edu?      Excl. in [place]?    No data found.   Updated Vital Signs BP 105/76 (BP Location: Right Arm)   Pulse 68   Temp 98.2 F (36.8 C)   Resp 18   SpO2 98%   Visual Acuity Right Eye Distance:   Left Eye Distance:   Bilateral Distance:    Right Eye Near:   Left Eye Near:    Bilateral Near:     Physical Exam  Constitutional: He is oriented to person, place, and time. He appears well-developed and  well-nourished. No distress.  HENT:  Right TM intact with only trace ceremun post irrigation, no erythema, Left TM-unremarkable post irrigation  Neurological: He is alert and oriented to person, place, and time.  Skin: Skin is warm and dry. He is not diaphoretic.  Vague region of papules scattered to right axilla, left mid back and left lower back, 5-7 at most. Few A.  Keratosis otherwise normal  Psychiatric: His behavior is normal.  Nursing note and vitals reviewed.    UC Treatments / Results  Labs (all labs ordered are listed, but only abnormal results are displayed) Labs Reviewed - No data to display  EKG  EKG Interpretation None       Radiology No results found.  Procedures Procedures (including critical care time)  Medications Ordered in UC Medications - No data to display   Initial Impression / Assessment and Plan / UC Course  I have reviewed the triage vital signs and the nursing notes.  Pertinent labs & imaging results that were available during my care of the patient were reviewed by me and considered in my medical decision making (see chart for details).     Impacted cerumen resolved post irrigation with unremarkable ear exam following. Rash is non-specific. Suggest use of OTC cortisone cream, suspect mild exposure. FU with PCP or Dermatology if worsens of becomes concerning  Final Clinical Impressions(s) / UC Diagnoses   Final diagnoses:  Bilateral impacted cerumen  Rash and nonspecific skin eruption    New Prescriptions Discharge Medication List as of 07/12/2017  3:19 PM       Controlled Substance Prescriptions [place] Controlled Substance Registry consulted? Not Applicable   Prudencio Pair 07/12/17 1536

## 2017-10-04 DIAGNOSIS — L820 Inflamed seborrheic keratosis: Secondary | ICD-10-CM | POA: Diagnosis not present

## 2017-10-04 DIAGNOSIS — L812 Freckles: Secondary | ICD-10-CM | POA: Diagnosis not present

## 2017-10-04 DIAGNOSIS — L821 Other seborrheic keratosis: Secondary | ICD-10-CM | POA: Diagnosis not present

## 2017-10-04 DIAGNOSIS — D225 Melanocytic nevi of trunk: Secondary | ICD-10-CM | POA: Diagnosis not present

## 2017-10-04 DIAGNOSIS — D485 Neoplasm of uncertain behavior of skin: Secondary | ICD-10-CM | POA: Diagnosis not present

## 2017-10-04 DIAGNOSIS — L570 Actinic keratosis: Secondary | ICD-10-CM | POA: Diagnosis not present

## 2017-10-04 DIAGNOSIS — L723 Sebaceous cyst: Secondary | ICD-10-CM | POA: Diagnosis not present

## 2017-10-04 DIAGNOSIS — D1801 Hemangioma of skin and subcutaneous tissue: Secondary | ICD-10-CM | POA: Diagnosis not present

## 2017-12-06 DIAGNOSIS — H00014 Hordeolum externum left upper eyelid: Secondary | ICD-10-CM | POA: Diagnosis not present

## 2017-12-19 DIAGNOSIS — Z961 Presence of intraocular lens: Secondary | ICD-10-CM | POA: Diagnosis not present

## 2018-01-29 ENCOUNTER — Other Ambulatory Visit: Payer: Self-pay | Admitting: Adult Health

## 2018-01-30 NOTE — Telephone Encounter (Signed)
Needs annual visit

## 2018-01-31 ENCOUNTER — Telehealth: Payer: Self-pay | Admitting: Adult Health

## 2018-01-31 MED ORDER — LISINOPRIL 10 MG PO TABS: 10.0000 mg | ORAL_TABLET | Freq: Every day | ORAL | 0 refills | Status: DC

## 2018-01-31 NOTE — Telephone Encounter (Signed)
Copied from [place] [phone]. Topic: Quick Communication - Rx Refill/Question >> Jan 31, 2018 11:38 AM Neva Seat wrote: furosemide (LASIX) 20 MG tablet  lisinopril (PRINIVIL,ZESTRIL) 10 MG tablet meloxicam (MOBIC) 15 MG tablet  Pt needing refills asap   [facility] [phone] - [place], [place] AT [facility] [address] [address] Phone: [phone] Fax: [phone]

## 2018-01-31 NOTE — Telephone Encounter (Signed)
Lasix LOV: 11/20/16 PCP: [place]: Fransisca Connors. [address] [place]   Mobic LOV: 11/20/16 PCP: [place]: [facility] [address]

## 2018-01-31 NOTE — Telephone Encounter (Addendum)
Pt called regarding prescription refill request; last office visit 11/20/16; also explained that a medicare wellness visit is not the same as a physical exam by the PCP; also explained to pt that an office visit is needed for additional refills; pt offered and accepted appointment with [name], [facility] on 02/18/18 at 1300; he verifies understanding; his pharmacy is [place]; will route requests for mobic and lasix to provider for review; will send refill for lisinopril to cover until pt appointment.

## 2018-02-03 MED ORDER — MELOXICAM 15 MG PO TABS: 15.0000 mg | ORAL_TABLET | Freq: Every day | ORAL | 0 refills | Status: DC

## 2018-02-03 MED ORDER — FUROSEMIDE 20 MG PO TABS: 20.0000 mg | ORAL_TABLET | Freq: Every day | ORAL | 0 refills | Status: DC

## 2018-02-03 NOTE — Telephone Encounter (Signed)
See telephone note.  Both sent to the pharmacy for 30 days.

## 2018-02-03 NOTE — Telephone Encounter (Signed)
Medications sent in for 30 days.  No further unless seen by [facility].  Pt has scheduled appt.

## 2018-02-03 NOTE — Addendum Note (Signed)
Addended by: Miles Costain T on: 02/03/2018 08:43 AM   Modules accepted: Orders

## 2018-02-18 ENCOUNTER — Encounter: Payer: Self-pay | Admitting: Adult Health

## 2018-02-18 ENCOUNTER — Ambulatory Visit (INDEPENDENT_AMBULATORY_CARE_PROVIDER_SITE_OTHER): Payer: PPO | Admitting: Adult Health

## 2018-02-18 VITALS — BP 116/72 | Temp 97.7°F | Ht 69.75 in | Wt 187.0 lb

## 2018-02-18 DIAGNOSIS — N401 Enlarged prostate with lower urinary tract symptoms: Secondary | ICD-10-CM | POA: Diagnosis not present

## 2018-02-18 DIAGNOSIS — M1990 Unspecified osteoarthritis, unspecified site: Secondary | ICD-10-CM | POA: Diagnosis not present

## 2018-02-18 DIAGNOSIS — Z0000 Encounter for general adult medical examination without abnormal findings: Secondary | ICD-10-CM

## 2018-02-18 DIAGNOSIS — E785 Hyperlipidemia, unspecified: Secondary | ICD-10-CM | POA: Diagnosis not present

## 2018-02-18 DIAGNOSIS — Z789 Other specified health status: Secondary | ICD-10-CM | POA: Diagnosis not present

## 2018-02-18 DIAGNOSIS — R351 Nocturia: Secondary | ICD-10-CM

## 2018-02-18 DIAGNOSIS — I10 Essential (primary) hypertension: Secondary | ICD-10-CM

## 2018-02-18 DIAGNOSIS — E210 Primary hyperparathyroidism: Secondary | ICD-10-CM | POA: Diagnosis not present

## 2018-02-18 DIAGNOSIS — E782 Mixed hyperlipidemia: Secondary | ICD-10-CM | POA: Diagnosis not present

## 2018-02-18 LAB — CBC WITH DIFFERENTIAL/PLATELET
BASOS ABS: 0.1 10*3/uL (ref 0.0–0.1)
Basophils Relative: 0.7 % (ref 0.0–3.0)
Eosinophils Absolute: 0.2 10*3/uL (ref 0.0–0.7)
Eosinophils Relative: 2.5 % (ref 0.0–5.0)
HEMATOCRIT: 49.1 % (ref 39.0–52.0)
HEMOGLOBIN: 17 g/dL (ref 13.0–17.0)
LYMPHS ABS: 1.6 10*3/uL (ref 0.7–4.0)
LYMPHS PCT: 21.4 % (ref 12.0–46.0)
MCHC: 34.7 g/dL (ref 30.0–36.0)
MCV: 94.9 fl (ref 78.0–100.0)
MONOS PCT: 6.2 % (ref 3.0–12.0)
Monocytes Absolute: 0.5 10*3/uL (ref 0.1–1.0)
Neutro Abs: 5.3 10*3/uL (ref 1.4–7.7)
Neutrophils Relative %: 69.2 % (ref 43.0–77.0)
Platelets: 189 10*3/uL (ref 150.0–400.0)
RBC: 5.18 Mil/uL (ref 4.22–5.81)
RDW: 12.3 % (ref 11.5–15.5)
WBC: 7.6 10*3/uL (ref 4.0–10.5)

## 2018-02-18 LAB — BASIC METABOLIC PANEL
BUN: 20 mg/dL (ref 6–23)
CALCIUM: 9.5 mg/dL (ref 8.4–10.5)
CO2: 29 mEq/L (ref 19–32)
Chloride: 102 mEq/L (ref 96–112)
Creatinine, Ser: 1.13 mg/dL (ref 0.40–1.50)
GFR: 67.19 mL/min (ref >60.00)
Glucose, Bld: 81 mg/dL (ref 70–99)
Potassium: 4.6 mEq/L (ref 3.5–5.1)
SODIUM: 139 meq/L (ref 135–145)

## 2018-02-18 LAB — PSA: PSA: 2.84 ng/mL (ref 0.10–4.00)

## 2018-02-18 LAB — LIPID PANEL
Cholesterol: 129 mg/dL (ref 0–200)
HDL: 35.1 mg/dL — AB (ref >39.00)
LDL Cholesterol: 68 mg/dL (ref 0–99)
NONHDL: 93.45
Total CHOL/HDL Ratio: 4
Triglycerides: 127 mg/dL (ref 0.0–149.0)
VLDL: 25.4 mg/dL (ref 0.0–40.0)

## 2018-02-18 LAB — HEPATIC FUNCTION PANEL
ALBUMIN: 4.3 g/dL (ref 3.5–5.2)
ALK PHOS: 68 U/L (ref 39–117)
ALT: 22 U/L (ref 0–53)
AST: 23 U/L (ref 0–37)
Bilirubin, Direct: 0.2 mg/dL (ref 0.0–0.3)
TOTAL PROTEIN: 7.1 g/dL (ref 6.0–8.3)
Total Bilirubin: 0.9 mg/dL (ref 0.2–1.2)

## 2018-02-18 LAB — TSH: TSH: 1.01 u[IU]/mL (ref 0.35–4.50)

## 2018-02-18 MED ORDER — MELOXICAM 15 MG PO TABS: 15.0000 mg | ORAL_TABLET | Freq: Every day | ORAL | 3 refills | Status: DC

## 2018-02-18 MED ORDER — FUROSEMIDE 20 MG PO TABS: 20.0000 mg | ORAL_TABLET | Freq: Every day | ORAL | 3 refills | Status: DC

## 2018-02-18 MED ORDER — LISINOPRIL 10 MG PO TABS: 10.0000 mg | ORAL_TABLET | Freq: Every day | ORAL | 0 refills | Status: DC

## 2018-02-18 MED ORDER — SIMVASTATIN 40 MG PO TABS: 40.0000 mg | ORAL_TABLET | Freq: Every day | ORAL | 3 refills | Status: DC

## 2018-02-18 NOTE — Progress Notes (Addendum)
Subjective:    Patient ID: Paul Lowe, male    DOB: [birth year], 77 y.o.   MRN: [id number]  HPI Patient presents for yearly preventative medicine examination. He is a pleasant 77 year old male who  has a past medical history of Adult acne, Allergic rhinitis, Asthma, Cataracts, bilateral, Hyperlipidemia, Hypertension, Idiopathic parathyroidism ([place]), Osteoarthritis, Seasonal allergies, and Seizure ([place]) (1990's). 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   Hyperlipidemia - Controlled with Zocor Lab Results  Component Value Date   CHOL 116 11/20/2016   HDL 36.30 (L) 11/20/2016   LDLCALC 59 11/20/2016   TRIG 102.0 11/20/2016   CHOLHDL 3 11/20/2016    Hypertension - Controlled with lisinopril 20 mg and lasix 20 mg daily.  BP Readings from Last 3 Encounters:  02/18/18 116/72  07/12/17 105/76  05/23/17 112/74   Hyperparathyroidism - had parathyroidectomy in 2016. Denies any complications   All immunizations and health maintenance protocols were reviewed with the patient and needed orders were placed.  Appropriate screening laboratory values were ordered for the patient including screening of hyperlipidemia, renal function and hepatic function. If indicated by BPH, a PSA was ordered.  Medication reconciliation,  past medical history, social history, problem list and allergies were reviewed in detail with the patient  Goals were established with regard to weight loss, exercise, and  diet in compliance with medications  End of life planning was discussed. He has an advanced directive and living will.   He would like to make sure that he is still immune to MMR  Review of Systems  Constitutional: Negative.   HENT: Negative.   Eyes: Negative.   Respiratory: Negative.   Cardiovascular: Negative.   Gastrointestinal: Negative.   Endocrine: Negative.   Genitourinary: Negative.   Musculoskeletal: Positive for arthralgias.  Skin: Negative.   Allergic/Immunologic:  Negative.   Neurological: Negative.   Hematological: Negative.   Psychiatric/Behavioral: Negative.   All other systems reviewed and are negative.  Past Medical History:  Diagnosis Date  . Adult acne   . Allergic rhinitis   . Asthma   . Cataracts, bilateral   . Hyperlipidemia   . Hypertension   . Idiopathic parathyroidism ([place])   . Osteoarthritis   . Seasonal allergies   . Seizure ([place]) 1990's   no seizure activity since    Social History   Socioeconomic History  . Marital status: Married    Spouse name: Not on file  . Number of children: Not on file  . Years of education: Not on file  . Highest education level: Not on file  Occupational History  . Not on file  Social Needs  . Financial resource strain: Not on file  . Food insecurity:    Worry: Not on file    Inability: Not on file  . Transportation needs:    Medical: Not on file    Non-medical: Not on file  Tobacco Use  . Smoking status: Never Smoker  . Smokeless tobacco: Never Used  Substance and Sexual Activity  . Alcohol use: Yes    Alcohol/week: 1.2 oz    Types: 2 Cans of beer per week  . Drug use: No  . Sexual activity: Not on file  Lifestyle  . Physical activity:    Days per week: Not on file    Minutes per session: Not on file  . Stress: Not on file  Relationships  . Social connections:    Talks on phone: Not on file  Gets together: Not on file    Attends religious service: Not on file    Active member of club or organization: Not on file    Attends meetings of clubs or organizations: Not on file    Relationship status: Not on file  . Intimate partner violence:    Fear of current or ex partner: Not on file    Emotionally abused: Not on file    Physically abused: Not on file    Forced sexual activity: Not on file  Other Topics Concern  . 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 anal cyst    . APPENDECTOMY    . CATARACT EXTRACTION, BILATERAL    . PARATHYROIDECTOMY N/A 03/10/2015   Procedure: PARATHYROIDECTOMY;  Surgeon: Armandina Gemma, MD;  Location: [facility];  Service: General;  Laterality: N/A;  . REPLACEMENT TOTAL KNEE Bilateral     Family History  Problem Relation Age of Onset  . Stroke Mother   . Colon cancer Neg Hx   . Esophageal cancer Neg Hx   . Rectal cancer Neg Hx   . Stomach cancer Neg Hx     Allergies  Allergen Reactions  . Shrimp Flavor     REACTION: rash    Current Outpatient Medications on File Prior to Visit  Medication Sig Dispense Refill  . Ascorbic Acid (VITAMIN C) 100 MG tablet Take 100 mg by mouth daily.      Marland Kitchen doxycycline (VIBRA-TABS) 100 MG tablet Take 100 mg by mouth 2 (two) times daily. FOR ROSACEA    . fish oil-omega-3 fatty acids 1000 MG capsule Take 2 g by mouth daily.    Marland Kitchen loratadine (CLARITIN) 10 MG tablet Take 10 mg by mouth daily.      . Multiple Vitamin tablet Take 1 a day 30 tablet 0   Current Facility-Administered Medications on File Prior to Visit  Medication Dose Route Frequency Provider Last Rate Last Dose  . 0.9 %  sodium chloride infusion  500 mL Intravenous Continuous Danis, Estill Cotta III, MD        BP 116/72   Temp 97.7 F (36.5 C) (Oral)   Ht 5' 9.75" (1.772 m) Comment: WITH SHOES  Wt 187 lb (84.8 kg)   BMI 27.02 kg/m       Objective:   Physical Exam  Constitutional: He is oriented to person, place, and time. He appears well-developed and well-nourished. No distress.  HENT:  Head: Normocephalic and atraumatic.  Right Ear: External ear normal.  Left Ear: External ear normal.  Nose: Nose normal.  Mouth/Throat: Oropharynx is clear and moist. No oropharyngeal exudate.  Eyes: Pupils are equal, round, and reactive to light. Conjunctivae and EOM are normal. Right eye exhibits no discharge. Left eye exhibits no discharge. No scleral icterus.  Neck: Normal range  of motion. Neck supple. No JVD present. No tracheal deviation present. No thyromegaly present.  Cardiovascular: Normal rate, regular rhythm, normal heart sounds and intact distal pulses. Exam reveals no gallop and no friction rub.  No murmur heard. Pulmonary/Chest: Effort normal and breath sounds normal. No stridor. No respiratory distress. He has no wheezes. He has no rales. He exhibits no tenderness.  Abdominal: Soft. Bowel sounds are normal. He exhibits no distension and no  mass. There is no tenderness. There is no rebound and no guarding. No hernia.  Genitourinary:  Genitourinary Comments: Deferred    Musculoskeletal: Normal range of motion. He exhibits no edema, tenderness or deformity.  Lymphadenopathy:    He has no cervical adenopathy.  Neurological: He is alert and oriented to person, place, and time. He displays normal reflexes. No cranial nerve deficit or sensory deficit. He exhibits normal muscle tone. Coordination normal.  Skin: Skin is warm and dry. Capillary refill takes less than 2 seconds. No rash noted. He is not diaphoretic. No erythema. No pallor.  Various AK's throughout body   Psychiatric: He has a normal mood and affect. His behavior is normal. Judgment and thought content normal.  Nursing note and vitals reviewed.     Assessment & Plan:  1. Routine general medical examination at a health care facility - Continue to stay active and exercise  - Follow up in one year or sooner if needed  - Basic metabolic panel - CBC with Differential/Platelet - Hepatic function panel - Lipid panel - TSH  2. BPH associated with nocturia  - PSA  3. Essential hypertension, benign - Well controlled. No change in medications  - Basic metabolic panel - CBC with Differential/Platelet - Hepatic function panel - Lipid panel - TSH - lisinopril (PRINIVIL,ZESTRIL) 10 MG tablet; Take 1 tablet (10 mg total) by mouth daily.  Dispense: 30 tablet; Refill: 0 - furosemide (LASIX) 20 MG  tablet; Take 1 tablet (20 mg total) by mouth daily.  Dispense: 90 tablet; Refill: 3  4. Hyperlipidemia, unspecified hyperlipidemia type - Consider increase in statin  - Basic metabolic panel - CBC with Differential/Platelet - Hepatic function panel - Lipid panel - TSH - simvastatin (ZOCOR) 40 MG tablet; Take 1 tablet (40 mg total) by mouth at bedtime.  Dispense: 90 tablet; Refill: 3  5. Hyperparathyroidism, primary ([place])  - PTH, Intact and Calcium  6. Hyperlipidemia  - simvastatin (ZOCOR) 40 MG tablet; Take 1 tablet (40 mg total) by mouth at bedtime.  Dispense: 90 tablet; Refill: 3  7. History of measles, mumps, rubella (MMR) vaccination unknown  - Measles/Mumps/Rubella Immunity  8. Arthritis  - meloxicam (MOBIC) 15 MG tablet; Take 1 tablet (15 mg total) by mouth daily.  Dispense: 90 tablet; Refill: 3  [name], NP

## 2018-02-19 ENCOUNTER — Encounter: Payer: Self-pay | Admitting: Family Medicine

## 2018-02-19 LAB — MEASLES/MUMPS/RUBELLA IMMUNITY
Mumps IgG: 23.5 AU/mL
Rubella: >33 index
Rubeola IgG: >300 AU/mL

## 2018-02-19 LAB — PTH, INTACT AND CALCIUM
Calcium: 9.6 mg/dL (ref 8.6–10.3)
PTH: 71 pg/mL — AB (ref 14–64)

## 2018-02-25 ENCOUNTER — Other Ambulatory Visit: Payer: Self-pay | Admitting: Family Medicine

## 2018-02-25 DIAGNOSIS — R7989 Other specified abnormal findings of blood chemistry: Secondary | ICD-10-CM

## 2018-03-03 ENCOUNTER — Ambulatory Visit (HOSPITAL_COMMUNITY)
Admission: EM | Admit: 2018-03-03 | Discharge: 2018-03-03 | Disposition: A | Discharge status: Home / Self Care | Payer: PPO | Attending: Family Medicine | Admitting: Family Medicine

## 2018-03-03 ENCOUNTER — Encounter (HOSPITAL_COMMUNITY): Payer: Self-pay | Admitting: Emergency Medicine

## 2018-03-03 DIAGNOSIS — H6123 Impacted cerumen, bilateral: Secondary | ICD-10-CM | POA: Diagnosis not present

## 2018-03-03 NOTE — ED Provider Notes (Signed)
Paul Lowe   [id number] 03/03/18 Arrival Time: 3875  ASSESSMENT & PLAN:  1. Bilateral impacted cerumen    Improvement after flushing. May f/u with PCP or here as needed.  Reviewed expectations re: course of current medical issues. Questions answered. Outlined signs and symptoms indicating need for more acute intervention. Patient verbalized understanding. After Visit Summary given.   SUBJECTIVE: History from: patient.  Paul Lowe is a 77 y.o. male who presents with complaint of bilateral decreased hearing. Onset gradual over the past few weeks. No ear pain or drainage. No recent illnesses. H/O similar secondary to cerumen impaction. No specific aggravating or alleviating factors reported. OTC treatment: none.  Social History   Tobacco Use  Smoking Status Never Smoker  Smokeless Tobacco Never Used    ROS: As per HPI.   OBJECTIVE:  Vitals:   03/03/18 1138  BP: 139/89  Pulse: 75  Resp: 18  Temp: 98.3 F (36.8 C)  TempSrc: Oral  SpO2: 100%     General appearance: alert; appears fatigued Ear Canal: cerumen bilaterally TM: bilateral: normal (visualized after flushing ear canals) Neck: supple without LAD Lungs: unlabored respirations Skin: warm and dry Psychological: alert and cooperative; normal mood and affect  Allergies  Allergen Reactions  . Shrimp Flavor     REACTION: rash    Past Medical History:  Diagnosis Date  . Adult acne   . Allergic rhinitis   . Asthma   . Cataracts, bilateral   . Hyperlipidemia   . Hypertension   . Idiopathic parathyroidism ([place])   . Osteoarthritis   . Seasonal allergies   . Seizure ([place]) 1990's   no seizure activity since   Family History  Problem Relation Age of Onset  . Stroke Mother   . Colon cancer Neg Hx   . Esophageal cancer Neg Hx   . Rectal cancer Neg Hx   . Stomach cancer Neg Hx    Social History   Socioeconomic History  . Marital status: Married    Spouse name: Not on file  .  Number of children: Not on file  . Years of education: Not on file  . Highest education level: Not on file  Occupational History  . Not on file  Social Needs  . Financial resource strain: Not on file  . Food insecurity:    Worry: Not on file    Inability: Not on file  . Transportation needs:    Medical: Not on file    Non-medical: Not on file  Tobacco Use  . Smoking status: Never Smoker  . Smokeless tobacco: Never Used  Substance and Sexual Activity  . Alcohol use: Yes    Alcohol/week: 1.2 oz    Types: 2 Cans of beer per week  . Drug use: No  . Sexual activity: Not on file  Lifestyle  . Physical activity:    Days per week: Not on file    Minutes per session: Not on file  . Stress: Not on file  Relationships  . Social connections:    Talks on phone: Not on file    Gets together: Not on file    Attends religious service: Not on file    Active member of club or organization: Not on file    Attends meetings of clubs or organizations: Not on file    Relationship status: Not on file  . Intimate partner violence:    Fear of current or ex partner: Not on file    Emotionally abused: Not  on file    Physically abused: Not on file    Forced sexual activity: Not on file  Other Topics Concern  . 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

## 2018-03-03 NOTE — Progress Notes (Signed)
Subjective:   RAMIREZ FULLBRIGHT is a 77 y.o. male who presents for Medicare Annual/Subsequent preventive examination.  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  Colonoscopy 05/2017 Educated regarding shingrix   Cardiac Risk Factors include: advanced age (>80men, >29 women);dyslipidemia;male gender;hypertension     Objective:    Vitals: BP 108/80   Pulse 84   Ht 5\' 10"  (1.778 m)   Wt 188 lb (85.3 kg)   SpO2 94%   BMI 26.98 kg/m   Body mass index is 26.98 kg/m.  Advanced Directives 03/04/2018 05/23/2017 02/28/2015  Does Patient Have a Medical Advance Directive? Yes Yes Yes  Type of Advance Directive - [place];Living will Living will  Copy of [place] in Chart? - - No - copy requested    Tobacco Social History   Tobacco Use  Smoking Status Never Smoker  Smokeless Tobacco Never Used     Counseling given: Yes   Clinical Intake:     Past Medical History:  Diagnosis Date  . Adult acne   . Allergic rhinitis   . Asthma   . Cataracts, bilateral   . Hyperlipidemia   . Hypertension   . Idiopathic parathyroidism ([place])   . Osteoarthritis   . Seasonal allergies   . Seizure ([place]) 1990's   no seizure activity since   Past Surgical History:  Procedure Laterality Date  . anal cyst    . APPENDECTOMY    . CATARACT EXTRACTION, BILATERAL    . PARATHYROIDECTOMY N/A 03/10/2015   Procedure: PARATHYROIDECTOMY;   Surgeon: Armandina Gemma, MD;  Location: [facility];  Service: General;  Laterality: N/A;  . REPLACEMENT TOTAL KNEE Bilateral    Family History  Problem Relation Age of Onset  . Stroke Mother   . Colon cancer Neg Hx   . Esophageal cancer Neg Hx   . Rectal cancer Neg Hx   . Stomach cancer Neg Hx    Social History   Socioeconomic History  . Marital status: Married    Spouse name: Not on file  . Number of children: Not on file  . Years of education: Not on file  . Highest education level: Not on file  Occupational History  . Not on file  Social Needs  . Financial resource strain: Not on file  . Food insecurity:    Worry: Not on file    Inability: Not on file  . Transportation needs:    Medical: Not on file    Non-medical: Not on file  Tobacco Use  . Smoking status: Never Smoker  . Smokeless tobacco: Never Used  Substance and Sexual Activity  . Alcohol use: Yes    Alcohol/week: 1.2 oz    Types: 2 Cans of beer per week  . Drug use: No  . Sexual activity: Not on file  Lifestyle  . Physical activity:  Days per week: Not on file    Minutes per session: Not on file  . Stress: Not on file  Relationships  . Social connections:    Talks on phone: Not on file    Gets together: Not on file    Attends religious service: Not on file    Active member of club or organization: Not on file    Attends meetings of clubs or organizations: Not on file    Relationship status: Not on file  Other Topics Concern  . 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 Ascorbic Acid (VITAMIN C) 100 MG tablet Take 100 mg by mouth daily.    Marland Kitchen doxycycline (VIBRA-TABS) 100 MG tablet Take 100 mg by mouth 2 (two) times daily. FOR ROSACEA  . fish oil-omega-3 fatty acids 1000 MG capsule Take 2 g by mouth daily.  . furosemide (LASIX) 20 MG tablet  Take 1 tablet (20 mg total) by mouth daily.  Marland Kitchen lisinopril (PRINIVIL,ZESTRIL) 10 MG tablet Take 1 tablet (10 mg total) by mouth daily.  Marland Kitchen loratadine (CLARITIN) 10 MG tablet Take 10 mg by mouth daily.    . meloxicam (MOBIC) 15 MG tablet Take 1 tablet (15 mg total) by mouth daily.  . Multiple Vitamin tablet Take 1 a day  . simvastatin (ZOCOR) 40 MG tablet Take 1 tablet (40 mg total) by mouth at bedtime.   Facility-Administered Encounter Medications as of 03/04/2018  Medication  . 0.9 %  sodium chloride infusion    Activities of Daily Living In your present state of health, do you have any difficulty performing the following activities: 03/04/2018  Hearing? N  Vision? N  Difficulty concentrating or making decisions? N  Walking or climbing stairs? N  Dressing or bathing? N  Doing errands, shopping? N  Preparing Food and eating ? N  Using the Toilet? N  In the past six months, have you accidently leaked urine? N  Do you have problems with loss of bowel control? N  Managing your Medications? N  Managing your Finances? N  Housekeeping or managing your Housekeeping? N  Some recent data might be hidden    Patient Care Team: [name], NP as PCP - General (Family Medicine)   Assessment:   This is a routine wellness examination for R[place].  Exercise Activities and Dietary recommendations Current Exercise Habits: Home exercise routine, Type of exercise: walking, Time (Minutes): 60  Goals    . Patient Stated     Exercise more;  Maintain your health   Podiatrist - just to track your exercise        Fall Risk Fall Risk  02/18/2018 11/20/2016 12/12/2015 09/30/2014 09/29/2013  Falls in the past year? No No No No No     Depression Screen PHQ 2/9 Scores 03/04/2018 02/18/2018 11/20/2016 12/12/2015  PHQ - 2 Score 0 0 0 0    Cognitive Function MMSE - Mini Mental State Exam 03/04/2018 03/04/2018  Not completed: (No Data) (No Data)     Ad8 score reviewed for issues:  Issues making  decisions:  Less interest in hobbies / activities:  Repeats questions, stories (family complaining):  Trouble using ordinary gadgets (microwave, computer, phone):  Forgets the month or year:   Mismanaging finances:  Remembering appts:  Daily problems with thinking and/or memory: Ad8 score is=0       Immunization History  Administered Date(s) Administered  . Hepatitis A 06/29/2008  . Influenza Split 07/19/2011, 07/11/2012  . Influenza Whole 07/07/2008, 08/24/2009, 07/21/2010  . Influenza, High Dose Seasonal PF 07/19/2015  . Influenza,inj,Quad PF,6+ Mos 07/03/2013, 07/09/2014  . Influenza-Unspecified 07/10/2016  . Pneumococcal Conjugate-13 09/29/2013, 09/30/2014  . Pneumococcal Polysaccharide-23 05/24/2009  . Td 10/15/2004  . Tdap 12/19/2015  . Zoster 07/21/2010     Screening Tests Health Maintenance  Topic Date Due  . INFLUENZA VACCINE  05/15/2018  . TETANUS/TDAP  12/18/2025  . PNA vac Low Risk Adult  Completed         Plan:      PCP Notes   Health Maintenance Educated regarding the shingrix   Goal is to develop an exercise program or start using a pedometer to track his steps    Abnormal Screens  Went to the urgent care yesterday to have ears cleaned    Referrals  none  Patient concerns; none  Nurse Concerns; As noted   Next PCP apt Was just seen 02/18/2018    I have personally reviewed and noted the following in the patient's chart:   . Medical and social history . Use of alcohol, tobacco or illicit drugs  . Current medications and supplements . Functional ability and status . Nutritional status . Physical activity . Advanced directives . List of other physicians . Hospitalizations, surgeries, and ER visits in previous 12 months . Vitals . Screenings to include cognitive, depression, and falls . Referrals and appointments  In addition, I have reviewed and discussed with patient certain preventive protocols, quality metrics,  and best practice recommendations. A written personalized care plan for preventive services as well as general preventive health recommendations were provided to patient.     Wynetta Fines, RN  03/04/2018

## 2018-03-03 NOTE — Discharge Instructions (Signed)
You may purchase a Debrox Earwax Removal Kit at most pharmacies if you wish to try this at home.

## 2018-03-03 NOTE — ED Triage Notes (Signed)
Pt here for wax in ears; hx of same

## 2018-03-04 ENCOUNTER — Ambulatory Visit (INDEPENDENT_AMBULATORY_CARE_PROVIDER_SITE_OTHER): Payer: PPO

## 2018-03-04 VITALS — BP 108/80 | HR 84 | Ht 70.0 in | Wt 188.0 lb

## 2018-03-04 DIAGNOSIS — Z0000 Encounter for general adult medical examination without abnormal findings: Secondary | ICD-10-CM | POA: Diagnosis not present

## 2018-03-04 NOTE — Patient Instructions (Addendum)
[name] , Thank you for taking time to come for your Medicare Wellness Visit. I appreciate your ongoing commitment to your health goals. Please review the following plan we discussed and let me know if I can assist you in the future.   Shingrix is a vaccine for the prevention of Shingles in Adults 50 and older.  If you are on Medicare, the shingrix is covered under your Part D plan, so you will take both of the vaccines in the series at your pharmacy. Please check with your benefits regarding applicable copays or out of pocket expenses.  The Shingrix is given in 2 vaccines approx 8 weeks apart. You must receive the 2nd dose prior to 6 months from receipt of the first. Please have the pharmacist print out you Immunization  dates for our office records    These are the goals we discussed: Goals    . Patient Stated     Exercise more;  Maintain your health   Podiatrist - just to track your exercise        This is a list of the screening recommended for you and due dates:  Health Maintenance  Topic Date Due  . Flu Shot  05/15/2018  . Tetanus Vaccine  12/18/2025  . Pneumonia vaccines  Completed   Prevention of falls: Remove rugs or any tripping hazards in the home Use Non slip mats in bathtubs and showers Placing grab bars next to the toilet and or shower Placing handrails on both sides of the stair way Adding extra lighting in the home.   Personal safety issues reviewed:  1. Consider starting a community watch program per [facility] 2.  Changes batteries is smoke detector and/or carbon monoxide detector  3.  If you have firearms; keep them in a safe place 4.  Wear protection when in the sun; Always wear sunscreen or a hat; It is good to have your doctor check your skin annually or review any new areas of concern 5. Driving safety; Keep in the right lane; stay 3 car lengths behind the car in front of you on the highway; look 3 times prior to pulling out; carry your  cell phone everywhere you go!      Fall Prevention in the Home Falls can cause injuries. They can happen to people of all ages. There are many things you can do to make your home safe and to help prevent falls. What can I do on the outside of my home?  Regularly fix the edges of walkways and driveways and fix any cracks.  Remove anything that might make you trip as you walk through a door, such as a raised step or threshold.  Trim any bushes or trees on the path to your home.  Use bright outdoor lighting.  Clear any walking paths of anything that might make someone trip, such as rocks or tools.  Regularly check to see if handrails are loose or broken. Make sure that both sides of any steps have handrails.  Any raised decks and porches should have guardrails on the edges.  Have any leaves, snow, or ice cleared regularly.  Use sand or salt on walking paths during winter.  Clean up any spills in your garage right away. This includes oil or grease spills. What can I do in the bathroom?  Use night lights.  Install grab bars by the toilet and in the tub and shower. Do not use towel bars as grab bars.  Use non-skid  mats or decals in the tub or shower.  If you need to sit down in the shower, use a plastic, non-slip stool.  Keep the floor dry. Clean up any water that spills on the floor as soon as it happens.  Remove soap buildup in the tub or shower regularly.  Attach bath mats securely with double-sided non-slip rug tape.  Do not have throw rugs and other things on the floor that can make you trip. What can I do in the bedroom?  Use night lights.  Make sure that you have a light by your bed that is easy to reach.  Do not use any sheets or blankets that are too big for your bed. They should not hang down onto the floor.  Have a firm chair that has side arms. You can use this for support while you get dressed.  Do not have throw rugs and other things on the floor that  can make you trip. What can I do in the kitchen?  Clean up any spills right away.  Avoid walking on wet floors.  Keep items that you use a lot in easy-to-reach places.  If you need to reach something above you, use a strong step stool that has a grab bar.  Keep electrical cords out of the way.  Do not use floor polish or wax that makes floors slippery. If you must use wax, use non-skid floor wax.  Do not have throw rugs and other things on the floor that can make you trip. What can I do with my stairs?  Do not leave any items on the stairs.  Make sure that there are handrails on both sides of the stairs and use them. Fix handrails that are broken or loose. Make sure that handrails are as long as the stairways.  Check any carpeting to make sure that it is firmly attached to the stairs. Fix any carpet that is loose or worn.  Avoid having throw rugs at the top or bottom of the stairs. If you do have throw rugs, attach them to the floor with carpet tape.  Make sure that you have a light switch at the top of the stairs and the bottom of the stairs. If you do not have them, ask someone to add them for you. What else can I do to help prevent falls?  Wear shoes that: ? Do not have high heels. ? Have rubber bottoms. ? Are comfortable and fit you well. ? Are closed at the toe. Do not wear sandals.  If you use a stepladder: ? Make sure that it is fully opened. Do not climb a closed stepladder. ? Make sure that both sides of the stepladder are locked into place. ? Ask someone to hold it for you, if possible.  Clearly mark and make sure that you can see: ? Any grab bars or handrails. ? First and last steps. ? Where the edge of each step is.  Use tools that help you move around (mobility aids) if they are needed. These include: ? Canes. ? Walkers. ? Scooters. ? Crutches.  Turn on the lights when you go into a dark area. Replace any light bulbs as soon as they burn out.  Set up  your furniture so you have a clear path. Avoid moving your furniture around.  If any of your floors are uneven, fix them.  If there are any pets around you, be aware of where they are.  Review your medicines with your  doctor. Some medicines can make you feel dizzy. This can increase your chance of falling. Ask your doctor what other things that you can do to help prevent falls. This information is not intended to replace advice given to you by your health care provider. Make sure you discuss any questions you have with your health care provider. Document Released: 07/28/2009 Document Revised: 03/08/2016 Document Reviewed: 11/05/2014 Elsevier Interactive Patient Education  2018 [place] Maintenance, Male A healthy lifestyle and preventive care is important for your health and wellness. Ask your health care provider about what schedule of regular examinations is right for you. What should I know about weight and diet? Eat a Healthy Diet  Eat plenty of vegetables, fruits, whole grains, low-fat dairy products, and lean protein.  Do not eat a lot of foods high in solid fats, added sugars, or salt.  Maintain a Healthy Weight Regular exercise can help you achieve or maintain a healthy weight. You should:  Do at least 150 minutes of exercise each week. The exercise should increase your heart rate and make you sweat (moderate-intensity exercise).  Do strength-training exercises at least twice a week.  Watch Your Levels of Cholesterol and Blood Lipids  Have your blood tested for lipids and cholesterol every 5 years starting at 76 years of age. If you are at high risk for heart disease, you should start having your blood tested when you are 75 years old. You may need to have your cholesterol levels checked more often if: ? Your lipid or cholesterol levels are high. ? You are older than 75 years of age. ? You are at high risk for heart disease.  What should I know about cancer  screening? Many types of cancers can be detected early and may often be prevented. Lung Cancer  You should be screened every year for lung cancer if: ? You are a current smoker who has smoked for at least 30 years. ? You are a former smoker who has quit within the past 15 years.  Talk to your health care provider about your screening options, when you should start screening, and how often you should be screened.  Colorectal Cancer  Routine colorectal cancer screening usually begins at 75 years of age and should be repeated every 5-10 years until you are 75 years old. You may need to be screened more often if early forms of precancerous polyps or small growths are found. Your health care provider may recommend screening at an earlier age if you have risk factors for colon cancer.  Your health care provider may recommend using home test kits to check for hidden blood in the stool.  A small camera at the end of a tube can be used to examine your colon (sigmoidoscopy or colonoscopy). This checks for the earliest forms of colorectal cancer.  Prostate and Testicular Cancer  Depending on your age and overall health, your health care provider may do certain tests to screen for prostate and testicular cancer.  Talk to your health care provider about any symptoms or concerns you have about testicular or prostate cancer.  Skin Cancer  Check your skin from head to toe regularly.  Tell your health care provider about any new moles or changes in moles, especially if: ? There is a change in a mole's size, shape, or color. ? You have a mole that is larger than a pencil eraser.  Always use sunscreen. Apply sunscreen liberally and repeat throughout the day.  Protect  yourself by wearing long sleeves, pants, a wide-brimmed hat, and sunglasses when outside.  What should I know about heart disease, diabetes, and high blood pressure?  If you are 41-9 years of age, have your blood pressure checked  every 3-5 years. If you are 47 years of age or older, have your blood pressure checked every year. You should have your blood pressure measured twice-once when you are at a hospital or clinic, and once when you are not at a hospital or clinic. Record the average of the two measurements. To check your blood pressure when you are not at a hospital or clinic, you can use: ? An automated blood pressure machine at a pharmacy. ? A home blood pressure monitor.  Talk to your health care provider about your target blood pressure.  If you are between 96-1 years old, ask your health care provider if you should take aspirin to prevent heart disease.  Have regular diabetes screenings by checking your fasting blood sugar level. ? If you are at a normal weight and have a low risk for diabetes, have this test once every three years after the age of 76. ? If you are overweight and have a high risk for diabetes, consider being tested at a younger age or more often.  A one-time screening for abdominal aortic aneurysm (AAA) by ultrasound is recommended for men aged 60-75 years who are current or former smokers. What should I know about preventing infection? Hepatitis B If you have a higher risk for hepatitis B, you should be screened for this virus. Talk with your health care provider to find out if you are at risk for hepatitis B infection. Hepatitis C Blood testing is recommended for:  Everyone born from [birth year] through 1963.  Anyone with known risk factors for hepatitis C.  Sexually Transmitted Diseases (STDs)  You should be screened each year for STDs including gonorrhea and chlamydia if: ? You are sexually active and are younger than 75 years of age. ? You are older than 75 years of age and your health care provider tells you that you are at risk for this type of infection. ? Your sexual activity has changed since you were last screened and you are at an increased risk for chlamydia or gonorrhea. Ask your  health care provider if you are at risk.  Talk with your health care provider about whether you are at high risk of being infected with HIV. Your health care provider may recommend a prescription medicine to help prevent HIV infection.  What else can I do?  Schedule regular health, dental, and eye exams.  Stay current with your vaccines (immunizations).  Do not use any tobacco products, such as cigarettes, chewing tobacco, and e-cigarettes. If you need help quitting, ask your health care provider.  Limit alcohol intake to no more than 2 drinks per day. One drink equals 12 ounces of beer, 5 ounces of wine, or 1 ounces of hard liquor.  Do not use street drugs.  Do not share needles.  Ask your health care provider for help if you need support or information about quitting drugs.  Tell your health care provider if you often feel depressed.  Tell your health care provider if you have ever been abused or do not feel safe at home. This information is not intended to replace advice given to you by your health care provider. Make sure you discuss any questions you have with your health care provider. Document Released: 03/29/2008 Document Revised:  05/30/2016 Document Reviewed: 07/05/2015 Elsevier Interactive Patient Education  [employer].

## 2018-03-04 NOTE — Progress Notes (Signed)
I have reviewed documentation for AWV and Advance Care Planning provided by the health coach and agree with documentation. I was immediately available for questions.  

## 2018-03-08 ENCOUNTER — Encounter: Payer: Self-pay | Admitting: Adult Health

## 2018-04-07 ENCOUNTER — Other Ambulatory Visit: Payer: Self-pay | Admitting: Adult Health

## 2018-04-07 DIAGNOSIS — I10 Essential (primary) hypertension: Secondary | ICD-10-CM

## 2018-04-07 NOTE — Telephone Encounter (Signed)
Sent to the pharmacy by e-scribe. 

## 2018-04-07 NOTE — Telephone Encounter (Signed)
Pt calling to request refill for his blood pressure medication as well. He is requesting a 90 day supply.  [facility], [address] AT [address] [phone] (Phone) [phone] (Fax)

## 2018-04-18 ENCOUNTER — Other Ambulatory Visit: Payer: Self-pay | Admitting: Adult Health

## 2018-04-18 DIAGNOSIS — E785 Hyperlipidemia, unspecified: Secondary | ICD-10-CM

## 2018-04-18 DIAGNOSIS — E782 Mixed hyperlipidemia: Secondary | ICD-10-CM

## 2018-04-18 NOTE — Telephone Encounter (Signed)
DENIED.  FILLED ON 02/18/18 FOR 1 YEAR.  MESSAGE SENT TO THE PHARMACY.

## 2018-05-19 ENCOUNTER — Other Ambulatory Visit (INDEPENDENT_AMBULATORY_CARE_PROVIDER_SITE_OTHER): Payer: PPO

## 2018-05-19 DIAGNOSIS — R7989 Other specified abnormal findings of blood chemistry: Secondary | ICD-10-CM

## 2018-05-20 LAB — PTH, INTACT AND CALCIUM
CALCIUM: 10 mg/dL (ref 8.6–10.3)
PTH: 52 pg/mL (ref 14–64)

## 2018-10-02 DIAGNOSIS — M19011 Primary osteoarthritis, right shoulder: Secondary | ICD-10-CM | POA: Diagnosis not present

## 2018-10-02 DIAGNOSIS — M19012 Primary osteoarthritis, left shoulder: Secondary | ICD-10-CM | POA: Diagnosis not present

## 2018-10-03 DIAGNOSIS — L570 Actinic keratosis: Secondary | ICD-10-CM | POA: Diagnosis not present

## 2018-10-03 DIAGNOSIS — D485 Neoplasm of uncertain behavior of skin: Secondary | ICD-10-CM | POA: Diagnosis not present

## 2018-10-03 DIAGNOSIS — L820 Inflamed seborrheic keratosis: Secondary | ICD-10-CM | POA: Diagnosis not present

## 2018-10-03 DIAGNOSIS — L718 Other rosacea: Secondary | ICD-10-CM | POA: Diagnosis not present

## 2018-10-03 DIAGNOSIS — L821 Other seborrheic keratosis: Secondary | ICD-10-CM | POA: Diagnosis not present

## 2018-10-03 DIAGNOSIS — L723 Sebaceous cyst: Secondary | ICD-10-CM | POA: Diagnosis not present

## 2018-10-03 DIAGNOSIS — C44712 Basal cell carcinoma of skin of right lower limb, including hip: Secondary | ICD-10-CM | POA: Diagnosis not present

## 2018-10-03 DIAGNOSIS — D1801 Hemangioma of skin and subcutaneous tissue: Secondary | ICD-10-CM | POA: Diagnosis not present

## 2018-10-03 DIAGNOSIS — L308 Other specified dermatitis: Secondary | ICD-10-CM | POA: Diagnosis not present

## 2018-10-13 DIAGNOSIS — M25512 Pain in left shoulder: Secondary | ICD-10-CM | POA: Diagnosis not present

## 2018-10-13 DIAGNOSIS — M25612 Stiffness of left shoulder, not elsewhere classified: Secondary | ICD-10-CM | POA: Diagnosis not present

## 2018-10-13 DIAGNOSIS — M25511 Pain in right shoulder: Secondary | ICD-10-CM | POA: Diagnosis not present

## 2018-10-13 DIAGNOSIS — M25611 Stiffness of right shoulder, not elsewhere classified: Secondary | ICD-10-CM | POA: Diagnosis not present

## 2018-10-22 DIAGNOSIS — M25611 Stiffness of right shoulder, not elsewhere classified: Secondary | ICD-10-CM | POA: Diagnosis not present

## 2018-10-22 DIAGNOSIS — M25612 Stiffness of left shoulder, not elsewhere classified: Secondary | ICD-10-CM | POA: Diagnosis not present

## 2018-10-22 DIAGNOSIS — M25511 Pain in right shoulder: Secondary | ICD-10-CM | POA: Diagnosis not present

## 2018-10-22 DIAGNOSIS — M25512 Pain in left shoulder: Secondary | ICD-10-CM | POA: Diagnosis not present

## 2018-10-29 DIAGNOSIS — M25511 Pain in right shoulder: Secondary | ICD-10-CM | POA: Diagnosis not present

## 2018-10-29 DIAGNOSIS — M25612 Stiffness of left shoulder, not elsewhere classified: Secondary | ICD-10-CM | POA: Diagnosis not present

## 2018-10-29 DIAGNOSIS — M25512 Pain in left shoulder: Secondary | ICD-10-CM | POA: Diagnosis not present

## 2018-10-29 DIAGNOSIS — M25611 Stiffness of right shoulder, not elsewhere classified: Secondary | ICD-10-CM | POA: Diagnosis not present

## 2018-11-05 DIAGNOSIS — M19011 Primary osteoarthritis, right shoulder: Secondary | ICD-10-CM | POA: Diagnosis not present

## 2018-11-05 DIAGNOSIS — M25611 Stiffness of right shoulder, not elsewhere classified: Secondary | ICD-10-CM | POA: Diagnosis not present

## 2018-11-05 DIAGNOSIS — M6281 Muscle weakness (generalized): Secondary | ICD-10-CM | POA: Diagnosis not present

## 2018-11-05 DIAGNOSIS — M25612 Stiffness of left shoulder, not elsewhere classified: Secondary | ICD-10-CM | POA: Diagnosis not present

## 2018-11-10 DIAGNOSIS — M19011 Primary osteoarthritis, right shoulder: Secondary | ICD-10-CM | POA: Diagnosis not present

## 2018-11-12 DIAGNOSIS — M25611 Stiffness of right shoulder, not elsewhere classified: Secondary | ICD-10-CM | POA: Diagnosis not present

## 2018-11-12 DIAGNOSIS — M25612 Stiffness of left shoulder, not elsewhere classified: Secondary | ICD-10-CM | POA: Diagnosis not present

## 2018-11-12 DIAGNOSIS — M6281 Muscle weakness (generalized): Secondary | ICD-10-CM | POA: Diagnosis not present

## 2018-11-12 DIAGNOSIS — M19011 Primary osteoarthritis, right shoulder: Secondary | ICD-10-CM | POA: Diagnosis not present

## 2018-11-19 DIAGNOSIS — M6281 Muscle weakness (generalized): Secondary | ICD-10-CM | POA: Diagnosis not present

## 2018-11-19 DIAGNOSIS — M25512 Pain in left shoulder: Secondary | ICD-10-CM | POA: Diagnosis not present

## 2018-11-19 DIAGNOSIS — M25612 Stiffness of left shoulder, not elsewhere classified: Secondary | ICD-10-CM | POA: Diagnosis not present

## 2018-11-19 DIAGNOSIS — M25611 Stiffness of right shoulder, not elsewhere classified: Secondary | ICD-10-CM | POA: Diagnosis not present

## 2018-11-26 DIAGNOSIS — M25612 Stiffness of left shoulder, not elsewhere classified: Secondary | ICD-10-CM | POA: Diagnosis not present

## 2018-11-26 DIAGNOSIS — M25511 Pain in right shoulder: Secondary | ICD-10-CM | POA: Diagnosis not present

## 2018-11-26 DIAGNOSIS — M25611 Stiffness of right shoulder, not elsewhere classified: Secondary | ICD-10-CM | POA: Diagnosis not present

## 2018-11-26 DIAGNOSIS — M6281 Muscle weakness (generalized): Secondary | ICD-10-CM | POA: Diagnosis not present

## 2018-12-03 DIAGNOSIS — M25511 Pain in right shoulder: Secondary | ICD-10-CM | POA: Diagnosis not present

## 2018-12-03 DIAGNOSIS — M25611 Stiffness of right shoulder, not elsewhere classified: Secondary | ICD-10-CM | POA: Diagnosis not present

## 2018-12-03 DIAGNOSIS — M25512 Pain in left shoulder: Secondary | ICD-10-CM | POA: Diagnosis not present

## 2018-12-03 DIAGNOSIS — M25612 Stiffness of left shoulder, not elsewhere classified: Secondary | ICD-10-CM | POA: Diagnosis not present

## 2018-12-22 ENCOUNTER — Other Ambulatory Visit: Payer: Self-pay | Admitting: Adult Health

## 2018-12-22 DIAGNOSIS — I10 Essential (primary) hypertension: Secondary | ICD-10-CM

## 2018-12-23 NOTE — Telephone Encounter (Signed)
Sent to the pharmacy by e-scribe. 

## 2019-02-20 ENCOUNTER — Other Ambulatory Visit: Payer: Self-pay | Admitting: Adult Health

## 2019-02-20 DIAGNOSIS — I10 Essential (primary) hypertension: Secondary | ICD-10-CM

## 2019-02-20 NOTE — Telephone Encounter (Signed)
SENT TO THE PHARMACY BY E-SCRIBE FOR 90 DAYS. 

## 2019-03-06 ENCOUNTER — Ambulatory Visit: Payer: PPO

## 2019-03-18 ENCOUNTER — Ambulatory Visit: Payer: Self-pay | Admitting: Adult Health

## 2019-03-18 NOTE — Telephone Encounter (Signed)
There is a Covid-19 antibody test however the test is not considered very accurate right now.  If you test positive for antibodies it could be a false positive because the antibodies are similar to the antibodies from other cold viruses.  If you have had COVID-19 and you have antibodies, we don't yet know if you this means you are immune from getting it again. If it is negative then that means you have not yet had coronovirus.    I am happy to order if you would like, however due to above discussion it is unclear how valuable the results will be. Just let me know if you wish to proceed.   * If he would like to proceed, it is [id number]

## 2019-03-18 NOTE — Telephone Encounter (Signed)
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    I want to know if I have antibodies to the coronavirus.  Denies being sick with COVID-19 or having symptoms.  I let him know [facility] is not offering that service at this time. He was wanting to know who does.  I let him know I would send [name] a note and have someone call him back.   He was agreeable to this.  I sent a note to the [place] office.

## 2019-03-19 ENCOUNTER — Telehealth: Payer: Self-pay | Admitting: Adult Health

## 2019-03-19 ENCOUNTER — Other Ambulatory Visit: Payer: Self-pay | Admitting: Family Medicine

## 2019-03-19 DIAGNOSIS — Z20822 Contact with and (suspected) exposure to covid-19: Secondary | ICD-10-CM

## 2019-03-19 DIAGNOSIS — Z20828 Contact with and (suspected) exposure to other viral communicable diseases: Secondary | ICD-10-CM

## 2019-03-19 NOTE — Telephone Encounter (Signed)
See Cory's message from 03/18/19 regarding this patient's request for antibody testing. TC-relayed the information in Cory's note to the patient. Patient will phone back and request the test after he does some research. Cory approved ordering the LAB [id number] if patient desires.

## 2019-03-19 NOTE — Telephone Encounter (Signed)
Order placed and pt now on schedule.  Nothing further needed.

## 2019-03-19 NOTE — Telephone Encounter (Signed)
Would like to have antibody testing, please.Routing to PCP to contact patient.

## 2019-03-19 NOTE — Telephone Encounter (Signed)
Pt will need virtual visit with [facility].

## 2019-03-20 ENCOUNTER — Other Ambulatory Visit (INDEPENDENT_AMBULATORY_CARE_PROVIDER_SITE_OTHER): Payer: PPO

## 2019-03-20 ENCOUNTER — Other Ambulatory Visit: Payer: Self-pay

## 2019-03-20 DIAGNOSIS — Z20822 Contact with and (suspected) exposure to covid-19: Secondary | ICD-10-CM

## 2019-03-20 DIAGNOSIS — Z20828 Contact with and (suspected) exposure to other viral communicable diseases: Secondary | ICD-10-CM

## 2019-03-21 LAB — SAR COV2 SEROLOGY (COVID19)AB(IGG),IA: SARS CoV2 AB IGG: NEGATIVE

## 2019-03-24 ENCOUNTER — Other Ambulatory Visit: Payer: Self-pay | Admitting: Adult Health

## 2019-03-24 DIAGNOSIS — I10 Essential (primary) hypertension: Secondary | ICD-10-CM

## 2019-03-25 NOTE — Telephone Encounter (Signed)
Sent to the pharmacy by e-scribe.  Pt scheduled in Aug 2020 for cpx.

## 2019-03-29 ENCOUNTER — Encounter (INDEPENDENT_AMBULATORY_CARE_PROVIDER_SITE_OTHER): Payer: Self-pay

## 2019-03-31 ENCOUNTER — Encounter (INDEPENDENT_AMBULATORY_CARE_PROVIDER_SITE_OTHER): Payer: Self-pay

## 2019-04-01 DIAGNOSIS — D1801 Hemangioma of skin and subcutaneous tissue: Secondary | ICD-10-CM | POA: Diagnosis not present

## 2019-04-01 DIAGNOSIS — L720 Epidermal cyst: Secondary | ICD-10-CM | POA: Diagnosis not present

## 2019-04-01 DIAGNOSIS — L821 Other seborrheic keratosis: Secondary | ICD-10-CM | POA: Diagnosis not present

## 2019-04-01 DIAGNOSIS — Z85828 Personal history of other malignant neoplasm of skin: Secondary | ICD-10-CM | POA: Diagnosis not present

## 2019-04-01 DIAGNOSIS — L570 Actinic keratosis: Secondary | ICD-10-CM | POA: Diagnosis not present

## 2019-04-01 DIAGNOSIS — L812 Freckles: Secondary | ICD-10-CM | POA: Diagnosis not present

## 2019-04-20 ENCOUNTER — Ambulatory Visit: Payer: PPO

## 2019-04-26 ENCOUNTER — Other Ambulatory Visit: Payer: Self-pay | Admitting: Adult Health

## 2019-04-26 DIAGNOSIS — E785 Hyperlipidemia, unspecified: Secondary | ICD-10-CM

## 2019-04-26 DIAGNOSIS — E782 Mixed hyperlipidemia: Secondary | ICD-10-CM

## 2019-04-29 NOTE — Telephone Encounter (Signed)
Sent to the pharmacy by e-scribe for 90 days.  Pt has upcoming cpx. 

## 2019-05-27 ENCOUNTER — Encounter: Payer: PPO | Admitting: Adult Health

## 2019-05-29 ENCOUNTER — Other Ambulatory Visit: Payer: Self-pay | Admitting: Adult Health

## 2019-05-29 DIAGNOSIS — I10 Essential (primary) hypertension: Secondary | ICD-10-CM

## 2019-06-02 NOTE — Telephone Encounter (Signed)
Sent to the pharmacy by e-scribe.  Pt has upcoming cpx on 07/02/2019.

## 2019-06-29 ENCOUNTER — Other Ambulatory Visit: Payer: Self-pay | Admitting: Adult Health

## 2019-06-29 DIAGNOSIS — I10 Essential (primary) hypertension: Secondary | ICD-10-CM

## 2019-07-02 ENCOUNTER — Encounter: Payer: Self-pay | Admitting: Adult Health

## 2019-07-02 ENCOUNTER — Ambulatory Visit (INDEPENDENT_AMBULATORY_CARE_PROVIDER_SITE_OTHER): Payer: PPO | Admitting: Adult Health

## 2019-07-02 VITALS — BP 124/72 | HR 70 | Temp 97.6°F | Wt 197.8 lb

## 2019-07-02 DIAGNOSIS — E210 Primary hyperparathyroidism: Secondary | ICD-10-CM | POA: Diagnosis not present

## 2019-07-02 DIAGNOSIS — N401 Enlarged prostate with lower urinary tract symptoms: Secondary | ICD-10-CM

## 2019-07-02 DIAGNOSIS — I10 Essential (primary) hypertension: Secondary | ICD-10-CM

## 2019-07-02 DIAGNOSIS — R351 Nocturia: Secondary | ICD-10-CM | POA: Diagnosis not present

## 2019-07-02 DIAGNOSIS — Z0000 Encounter for general adult medical examination without abnormal findings: Secondary | ICD-10-CM | POA: Diagnosis not present

## 2019-07-02 DIAGNOSIS — E782 Mixed hyperlipidemia: Secondary | ICD-10-CM | POA: Diagnosis not present

## 2019-07-02 DIAGNOSIS — M1990 Unspecified osteoarthritis, unspecified site: Secondary | ICD-10-CM

## 2019-07-02 LAB — CBC WITH DIFFERENTIAL/PLATELET
Basophils Absolute: 0 10*3/uL (ref 0.0–0.1)
Basophils Relative: 0.5 % (ref 0.0–3.0)
Eosinophils Absolute: 0.3 10*3/uL (ref 0.0–0.7)
Eosinophils Relative: 4.5 % (ref 0.0–5.0)
HCT: 45.9 % (ref 39.0–52.0)
Hemoglobin: 16 g/dL (ref 13.0–17.0)
Lymphocytes Relative: 22.6 % (ref 12.0–46.0)
Lymphs Abs: 1.6 10*3/uL (ref 0.7–4.0)
MCHC: 34.8 g/dL (ref 30.0–36.0)
MCV: 95.3 fl (ref 78.0–100.0)
Monocytes Absolute: 0.6 10*3/uL (ref 0.1–1.0)
Monocytes Relative: 8.4 % (ref 3.0–12.0)
Neutro Abs: 4.6 10*3/uL (ref 1.4–7.7)
Neutrophils Relative %: 64 % (ref 43.0–77.0)
Platelets: 177 10*3/uL (ref 150.0–400.0)
RBC: 4.82 Mil/uL (ref 4.22–5.81)
RDW: 12.7 % (ref 11.5–15.5)
WBC: 7.2 10*3/uL (ref 4.0–10.5)

## 2019-07-02 LAB — PSA: PSA: 2.61 ng/mL (ref 0.10–4.00)

## 2019-07-02 LAB — COMPREHENSIVE METABOLIC PANEL
ALT: 25 U/L (ref 0–53)
AST: 25 U/L (ref 0–37)
Albumin: 4.2 g/dL (ref 3.5–5.2)
Alkaline Phosphatase: 75 U/L (ref 39–117)
BUN: 21 mg/dL (ref 6–23)
CO2: 27 mEq/L (ref 19–32)
Calcium: 9.5 mg/dL (ref 8.4–10.5)
Chloride: 106 mEq/L (ref 96–112)
Creatinine, Ser: 1.28 mg/dL (ref 0.40–1.50)
GFR: 54.55 mL/min — ABNORMAL LOW (ref >60.00)
Glucose, Bld: 88 mg/dL (ref 70–99)
Potassium: 5.2 mEq/L — ABNORMAL HIGH (ref 3.5–5.1)
Sodium: 139 mEq/L (ref 135–145)
Total Bilirubin: 0.8 mg/dL (ref 0.2–1.2)
Total Protein: 6.8 g/dL (ref 6.0–8.3)

## 2019-07-02 LAB — LIPID PANEL
Cholesterol: 111 mg/dL (ref 0–200)
HDL: 34 mg/dL — ABNORMAL LOW (ref >39.00)
LDL Cholesterol: 46 mg/dL (ref 0–99)
NonHDL: 76.53
Total CHOL/HDL Ratio: 3
Triglycerides: 154 mg/dL — ABNORMAL HIGH (ref 0.0–149.0)
VLDL: 30.8 mg/dL (ref 0.0–40.0)

## 2019-07-02 LAB — TSH: TSH: 1.19 u[IU]/mL (ref 0.35–4.50)

## 2019-07-02 LAB — VITAMIN D 25 HYDROXY (VIT D DEFICIENCY, FRACTURES): VITD: 43.38 ng/mL (ref 30.00–100.00)

## 2019-07-02 NOTE — Progress Notes (Signed)
Subjective:    Patient ID: Paul Lowe, male    DOB: 09/25/1941, 78 y.o.   MRN: [id number]  HPI  Patient presents for yearly preventative medicine examination.Pleasant 78 year old male who  has a past medical history of Adult acne, Allergic rhinitis, Asthma, Cataracts, bilateral, Hyperlipidemia, Hypertension, Idiopathic parathyroidism ([place]), Osteoarthritis, Seasonal allergies, and Seizure ([place]) (1990's).   He reports that he is doing well. He continues to teach Physics at [employer]  Hyperlipidemia-currently controlled with Zocor.  He denies myalgias or fatigue Lab Results  Component Value Date   CHOL 129 02/18/2018   HDL 35.10 (L) 02/18/2018   LDLCALC 68 02/18/2018   TRIG 127.0 02/18/2018   CHOLHDL 4 02/18/2018    Hypertension-takes lisinopril 20 mg daily and Lasix 20 mg daily.  He denies dizziness, lightheadedness, chest pain, or shortness of breath. BP Readings from Last 3 Encounters:  07/02/19 124/72  03/04/18 108/80  03/03/18 139/89   Hyperparathyroidism -he had a parathyroid to me in 2016.  Denies any complications  Rosacea -takes doxycycline 100 mg twice daily  Osteoarthritis -takes Mobic 15 mg daily PRN for pain control.   All immunizations and health maintenance protocols were reviewed with the patient and needed orders were placed. He is due for seasonal flu vaccination  Appropriate screening laboratory values were ordered for the patient including screening of hyperlipidemia, renal function and hepatic function. If indicated by BPH, a PSA was ordered.  Medication reconciliation,  past medical history, social history, problem list and allergies were reviewed in detail with the patient  Goals were established with regard to weight loss, exercise, and  diet in compliance with medications. He Continues to walk but has not been able to go to the gym. His weight is up 9 pounds over the last year   Wt Readings from Last 3 Encounters:  07/02/19 197 lb 12.8 oz (89.7 kg)   03/04/18 188 lb (85.3 kg)  02/18/18 187 lb (84.8 kg)   End of life planning was discussed. He has an advanced directive and living will   He is up-to-date on routine screening colonoscopies  He has been seen by Dermatology in June for his skin check.   He has no acute issues.   Review of Systems  Constitutional: Negative.   HENT: Negative.   Eyes: Negative.   Respiratory: Negative.   Cardiovascular: Negative.   Gastrointestinal: Negative.   Endocrine: Negative.   Genitourinary: Negative.   Musculoskeletal: Positive for arthralgias.  Skin: Negative.   Allergic/Immunologic: Negative.   Neurological: Negative.   Hematological: Negative.   Psychiatric/Behavioral: Negative.   All other systems reviewed and are negative.  Past Medical History:  Diagnosis Date  . Adult acne   . Allergic rhinitis   . Asthma   . Cataracts, bilateral   . Hyperlipidemia   . Hypertension   . Idiopathic parathyroidism ([place])   . Osteoarthritis   . Seasonal allergies   . Seizure ([place]) 1990's   no seizure activity since    Social History   Socioeconomic History  . Marital status: Married    Spouse name: Not on file  . Number of children: Not on file  . Years of education: Not on file  . Highest education level: Not on file  Occupational History  . Not on file  Social Needs  . Financial resource strain: Not on file  . Food insecurity    Worry: Not on file    Inability: Not on file  . Transportation needs  Medical: Not on file    Non-medical: Not on file  Tobacco Use  . Smoking status: Never Smoker  . Smokeless tobacco: Never Used  Substance and Sexual Activity  . Alcohol use: Yes    Alcohol/week: 2.0 standard drinks    Types: 2 Cans of beer per week  . Drug use: No  . Sexual activity: Not on file  Lifestyle  . Physical activity    Days per week: Not on file    Minutes per session: Not on file  . Stress: Not on file  Relationships  . Social Herbalist on phone:  Not on file    Gets together: Not on file    Attends religious service: Not on file    Active member of club or organization: Not on file    Attends meetings of clubs or organizations: Not on file    Relationship status: Not on file  . Intimate partner violence    Fear of current or ex partner: Not on file    Emotionally abused: Not on file    Physically abused: Not on file    Forced sexual activity: Not on file  Other Topics Concern  . 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 anal cyst    . APPENDECTOMY    . CATARACT EXTRACTION, BILATERAL    . PARATHYROIDECTOMY N/A 03/10/2015   Procedure: PARATHYROIDECTOMY;  Surgeon: Armandina Gemma, MD;  Location: [facility];  Service: General;  Laterality: N/A;  . REPLACEMENT TOTAL KNEE Bilateral     Family History  Problem Relation Age of Onset  . Stroke Mother   . Colon cancer Neg Hx   . Esophageal cancer Neg Hx   . Rectal cancer Neg Hx   . Stomach cancer Neg Hx     Allergies  Allergen Reactions  . Shrimp Flavor     REACTION: rash    Current Outpatient Medications on File Prior to Visit  Medication Sig Dispense Refill  . Ascorbic Acid (VITAMIN C) 100 MG tablet Take 100 mg by mouth daily.      Marland Kitchen doxycycline (VIBRA-TABS) 100 MG tablet Take 100 mg by mouth 2 (two) times daily. FOR ROSACEA    . fish oil-omega-3 fatty acids 1000 MG capsule Take 2 g by mouth daily.    . furosemide (LASIX) 20 MG tablet TAKE 1 TABLET(20 MG) BY MOUTH DAILY 90 tablet 0  . lisinopril (ZESTRIL) 10 MG tablet TAKE 1 TABLET BY MOUTH DAILY 90 tablet 0  . loratadine (CLARITIN) 10 MG tablet Take 10 mg by mouth daily.      . meloxicam (MOBIC) 15 MG tablet Take 1 tablet (15 mg total) by mouth daily. 90 tablet 3  . Multiple Vitamin tablet Take 1 a day 30 tablet 0  . simvastatin (ZOCOR) 40 MG tablet TAKE 1 TABLET(40 MG) BY  MOUTH AT BEDTIME 90 tablet 0   Current Facility-Administered Medications on File Prior to Visit  Medication Dose Route Frequency Provider Last Rate Last Dose  . 0.9 %  sodium chloride infusion  500 mL Intravenous Continuous Danis, Estill Cotta III, MD        BP 124/72 (BP Location: Left Arm, Patient Position: Sitting, Cuff Size: Normal)   Pulse 70  Temp 97.6 F (36.4 C) (Temporal)   Wt 197 lb 12.8 oz (89.7 kg)   SpO2 96%   BMI 28.38 kg/m       Objective:   Physical Exam Vitals signs and nursing note reviewed.  Constitutional:      General: He is not in acute distress.    Appearance: Normal appearance. He is not diaphoretic.  HENT:     Head: Normocephalic and atraumatic.     Right Ear: Tympanic membrane, ear canal and external ear normal. There is no impacted cerumen.     Left Ear: Tympanic membrane, ear canal and external ear normal. There is no impacted cerumen.     Nose: Nose normal. No congestion or rhinorrhea.     Mouth/Throat:     Mouth: Mucous membranes are moist.     Pharynx: Oropharynx is clear. No oropharyngeal exudate or posterior oropharyngeal erythema.  Eyes:     General: No scleral icterus.       Right eye: No discharge.        Left eye: No discharge.     Conjunctiva/sclera: Conjunctivae normal.     Pupils: Pupils are equal, round, and reactive to light.  Neck:     Musculoskeletal: Normal range of motion and neck supple.     Thyroid: No thyromegaly.     Vascular: No carotid bruit or JVD.     Trachea: No tracheal deviation.  Cardiovascular:     Rate and Rhythm: Normal rate and regular rhythm.     Pulses: Normal pulses.     Heart sounds: Normal heart sounds. No murmur. No friction rub. No gallop.   Pulmonary:     Effort: Pulmonary effort is normal. No respiratory distress.     Breath sounds: Normal breath sounds. No stridor. No wheezing, rhonchi or rales.  Chest:     Chest wall: No tenderness.  Abdominal:     General: Bowel sounds are normal. There is no  distension.     Palpations: Abdomen is soft. There is no mass.     Tenderness: There is no abdominal tenderness. There is no right CVA tenderness, left CVA tenderness, guarding or rebound.     Hernia: No hernia is present.  Musculoskeletal: Normal range of motion.        General: No swelling, tenderness, deformity or signs of injury.     Right lower leg: No edema.     Left lower leg: No edema.  Lymphadenopathy:     Cervical: No cervical adenopathy.  Skin:    General: Skin is warm and dry.     Capillary Refill: Capillary refill takes less than 2 seconds.     Coloration: Skin is not jaundiced or pale.     Findings: No bruising, erythema, lesion or rash.  Neurological:     General: No focal deficit present.     Mental Status: He is alert and oriented to person, place, and time.     Cranial Nerves: No cranial nerve deficit.     Motor: No abnormal muscle tone.     Coordination: Coordination normal.     Deep Tendon Reflexes: Reflexes are normal and symmetric. Reflexes normal.  Psychiatric:        Mood and Affect: Mood normal.        Behavior: Behavior normal.        Thought Content: Thought content normal.        Judgment: Judgment normal.       Assessment & Plan:  1.  Routine general medical examination at a health care facility - Encouraged weight loss through diet and exercise - Follow up in one year or sooner if needed - CBC with Differential/Platelet - Comprehensive metabolic panel - Lipid panel - TSH - PTH, Intact and Calcium - Vitamin D, 25-hydroxy  2. Essential hypertension, benign - Well controlled. No change in medications  - CBC with Differential/Platelet - Comprehensive metabolic panel - Lipid panel - TSH - PTH, Intact and Calcium - Vitamin D, 25-hydroxy  3. Mixed hyperlipidemia - Consider dose change  - CBC with Differential/Platelet - Comprehensive metabolic panel - Lipid panel - TSH - PTH, Intact and Calcium - Vitamin D, 25-hydroxy  4.  Hyperparathyroidism, primary ([place])  - PTH, Intact and Calcium - Vitamin D, 25-hydroxy  5. Arthritis  - Vitamin D, 25-hydroxy  6. BPH associated with nocturia  - PSA  [name], NP

## 2019-07-03 LAB — PTH, INTACT AND CALCIUM
Calcium: 9.5 mg/dL (ref 8.6–10.3)
PTH: 42 pg/mL (ref 14–64)

## 2019-07-06 ENCOUNTER — Other Ambulatory Visit: Payer: Self-pay | Admitting: Adult Health

## 2019-07-06 DIAGNOSIS — E785 Hyperlipidemia, unspecified: Secondary | ICD-10-CM

## 2019-07-06 DIAGNOSIS — I10 Essential (primary) hypertension: Secondary | ICD-10-CM

## 2019-07-06 DIAGNOSIS — M1990 Unspecified osteoarthritis, unspecified site: Secondary | ICD-10-CM

## 2019-07-06 DIAGNOSIS — E782 Mixed hyperlipidemia: Secondary | ICD-10-CM

## 2019-07-07 NOTE — Telephone Encounter (Signed)
Patient's wife called in checking on status of refill. Would also like to include all of the other medications. Please advise.

## 2019-07-08 MED ORDER — MELOXICAM 15 MG PO TABS: 15.0000 mg | ORAL_TABLET | Freq: Every day | ORAL | 3 refills | Status: DC

## 2019-07-08 MED ORDER — SIMVASTATIN 40 MG PO TABS: ORAL_TABLET | ORAL | 3 refills | Status: DC

## 2019-07-08 MED ORDER — FUROSEMIDE 20 MG PO TABS: ORAL_TABLET | ORAL | 3 refills | Status: DC

## 2019-07-08 NOTE — Telephone Encounter (Signed)
Sent to the pharmacy by e-scribe for 1 year. 

## 2019-09-07 ENCOUNTER — Other Ambulatory Visit: Payer: Self-pay

## 2019-10-30 ENCOUNTER — Ambulatory Visit: Payer: Medicare Other | Attending: Internal Medicine

## 2019-10-30 DIAGNOSIS — Z23 Encounter for immunization: Secondary | ICD-10-CM | POA: Insufficient documentation

## 2019-10-30 NOTE — Progress Notes (Signed)
   Covid-19 Vaccination Clinic  Name:  GABRIAL SCHELLIN    MRN: [id number] DOB: [birth year]  10/30/2019  Mr. Jiggetts was observed post Covid-19 immunization for 30 minutes based on pre-vaccination screening without incidence. He was provided with Vaccine Information Sheet and instruction to access the V-Safe system.   Mr. Pasierb was instructed to call 911 with any severe reactions post vaccine: Marland Kitchen Difficulty breathing  . Swelling of your face and throat  . A fast heartbeat  . A bad rash all over your body  . Dizziness and weakness    Immunizations Administered    Name Date Dose VIS Date Route   Pfizer COVID-19 Vaccine 10/30/2019  9:27 AM 0.3 mL 09/25/2019 Intramuscular   Manufacturer: [place]   Lot: [id number]   [place]: [id number]

## 2019-11-18 ENCOUNTER — Ambulatory Visit: Payer: PPO | Attending: Internal Medicine

## 2019-11-18 DIAGNOSIS — Z23 Encounter for immunization: Secondary | ICD-10-CM

## 2019-11-18 NOTE — Progress Notes (Signed)
   Covid-19 Vaccination Clinic  Name:  Paul Lowe    MRN: [id number] DOB: 1941-05-04  11/18/2019  [name] was observed post Covid-19 immunization for 15 minutes without incidence. He was provided with Vaccine Information Sheet and instruction to access the V-Safe system.   [name] was instructed to call 911 with any severe reactions post vaccine: Marland Kitchen Difficulty breathing  . Swelling of your face and throat  . A fast heartbeat  . A bad rash all over your body  . Dizziness and weakness    Immunizations Administered    Name Date Dose VIS Date Route   Pfizer COVID-19 Vaccine 11/18/2019  8:50 AM 0.3 mL 09/25/2019 Intramuscular   Manufacturer: [place]   Lot: [id number]   [place]: [id number]

## 2019-12-14 ENCOUNTER — Telehealth: Payer: Self-pay | Admitting: Adult Health

## 2019-12-14 NOTE — Chronic Care Management (AMB) (Signed)
  Chronic Care Management   Note  12/14/2019 Name: Paul Lowe MRN: [id number] DOB: 08/10/1941  Paul Lowe is a 79 y.o. year old male who is a primary care patient of [name], NP. I reached out to Paul Lowe by phone today in response to a referral sent by Mr. Mansur Patti Janak's PCP, [name], NP.   Mr. Beach was given information about Chronic Care Management services today including:  1. CCM service includes personalized support from designated clinical staff supervised by his physician, including individualized plan of care and coordination with other care providers 2. 24/7 contact phone numbers for assistance for urgent and routine care needs. 3. Service will only be billed when office clinical staff spend 20 minutes or more in a month to coordinate care. 4. Only one practitioner may furnish and bill the service in a calendar month. 5. The patient may stop CCM services at any time (effective at the end of the month) by phone call to the office staff. 6. The patient will be responsible for cost sharing (co-pay) of up to 20% of the service fee (after annual deductible is met).  Patient agreed to services and verbal consent obtained.   Follow up plan:   Raynicia Dukes [employer] Scheduler

## 2019-12-29 ENCOUNTER — Other Ambulatory Visit: Payer: Self-pay | Admitting: Adult Health

## 2019-12-29 ENCOUNTER — Telehealth: Payer: Self-pay

## 2019-12-29 DIAGNOSIS — E782 Mixed hyperlipidemia: Secondary | ICD-10-CM

## 2019-12-29 DIAGNOSIS — I10 Essential (primary) hypertension: Secondary | ICD-10-CM

## 2019-12-29 NOTE — Telephone Encounter (Signed)
I am requesting an ambulatory referral to CCM be placed for this patient. Referral will need to have 2 current diagnosis attached to it. Thank you!  

## 2019-12-31 ENCOUNTER — Telehealth: Payer: PPO

## 2019-12-31 ENCOUNTER — Ambulatory Visit: Payer: PPO

## 2019-12-31 ENCOUNTER — Other Ambulatory Visit: Payer: Self-pay

## 2019-12-31 DIAGNOSIS — E782 Mixed hyperlipidemia: Secondary | ICD-10-CM

## 2019-12-31 DIAGNOSIS — M1990 Unspecified osteoarthritis, unspecified site: Secondary | ICD-10-CM

## 2019-12-31 DIAGNOSIS — I10 Essential (primary) hypertension: Secondary | ICD-10-CM

## 2019-12-31 NOTE — Chronic Care Management (AMB) (Signed)
Chronic Care Management Pharmacy  Name: Paul Lowe  MRN: [id number] DOB: 1941-03-03  Initial Questions: 1. Have you seen any other providers since your last visit? NA 2. Any changes in your medicines or health? No   Chief Complaint/ HPI  Paul Lowe,  79 y.o. , male presents for their Initial CCM visit with the clinical pharmacist via telephone due to COVID-19 Pandemic.  PCP : [name], NP  Their chronic conditions include: HTN, HLD, Allergic rhinitis, Hyperparathyroidism, Osteoarthritis, Rosacea   Office Visits: 07/02/2019- Patient presented to [name], NP for yearly preventative exam. Patient has past medical history of: adult acne, allergic rhinitis, asthma, cataracts, hyperlipidemia, hypertension, idiopathic parathyroidism, osteoarthritis, and seizure (1990s). Patient's conditions controlled and to continue current regimen. Patient to obtain CBC, CMP, lipid panel, TSH, PTH, vitamin D, PSA.   Medications: Outpatient Encounter Medications as of 12/31/2019  Medication Sig  . Ascorbic Acid (VITAMIN C) 100 MG tablet Take 500 mg by mouth daily.   . fish oil-omega-3 fatty acids 1000 MG capsule Take 1 g by mouth daily.   . furosemide (LASIX) 20 MG tablet TAKE 1 TABLET(20 MG) BY MOUTH DAILY  . lisinopril (ZESTRIL) 10 MG tablet TAKE 1 TABLET BY MOUTH DAILY  . loratadine (CLARITIN) 10 MG tablet Take 10 mg by mouth daily.    . meloxicam (MOBIC) 15 MG tablet Take 1 tablet (15 mg total) by mouth daily.  . Multiple Vitamin tablet Take 1 a day  . simvastatin (ZOCOR) 40 MG tablet TAKE 1 TABLET(40 MG) BY MOUTH AT BEDTIME (Patient taking differently: 20 mg daily. Patient cuts tablets in half)  . doxycycline (VIBRA-TABS) 100 MG tablet Take 100 mg by mouth 2 (two) times daily. FOR ROSACEA   No facility-administered encounter medications on file as of 12/31/2019.    Current Diagnosis/Assessment:  Goals Addressed            This Visit's Progress   . Pharmacy Care Plan        CARE PLAN ENTRY  Current Barriers:  . Chronic Disease Management support, education, and care coordination needs related to HTN, HLD, and Allergic rhinitis, hyperparathyroidism, osteoarthritis, rosacea  Pharmacist Clinical Goal(s):  Marland Kitchen Work with the care management team to address educational, disease management, and care coordination needs  . Call provider office for new or worsened signs and symptoms . Call care management team with questions or concerns.  . Blood pressure:  Marland Kitchen Maintain blood pressure within goal of your provider (130/80 or 140/90)  . Last blood pressure reading: 124/72 (07/02/19)  . High cholesterol:  . Cholesterol goals: Total Cholesterol goal under 200, Triglycerides goal under 150, HDL goal above 40 (men) or above 50 (women), LDL goal under 100.  . Current cholesterol levels (07/02/2019): total cholesterol: 111; triglycerides: 154; HDL: 34; LDL: 46. . Allergic rhinitis . Minimize allergy symptoms (runny nose, congestion, etc).  . Osteoarthritis . Continue to see improvement in pain level.  . Rosacea . Minimize appearance of rosacea.  Interventions: . Comprehensive medication review performed. . Evaluation of current treatment plans and patient's adherence to plan as established by provider . Assessed patient's education and care coordination needs . Provided disease specific education to patient. . Blood pressure:  . Discussed need to continue checking blood pressure at home.  . Discussed diet modifications. DASH diet:  following a diet emphasizing fruits and vegetables and low-fat dairy products along with whole grains, fish, poultry, and nuts. Reducing red meats and sugars.  . Smoking cessation  .  Exercising- see exercising section below  . Limiting the amount of alcohol to 1 or 2 drinks per day.   . Reducing the amount of salt intake to 1500mg /per day.  . Recommend using a salt substitute to replace your salt if you need flavor.    . Getting enough  potassium in your diet equaling 3500-5000mg /day.  This helps to regulate BP by balancing out the effects of salt.   . Weight reduction- We discussed losing 5-10% of body weight . Continue:  - lisinopril 10mg , 1 tablet daily - furosemide 20mg , 1 tablet daily . High cholesterol . How to reduce cholesterol through diet/weight management and physical activity.    . We discussed how a diet high in plant sterols (fruits/vegetables/nuts/whole grains/legumes) may reduce your cholesterol.  Encouraged increasing fiber to a daily intake of 10-25g/day  . Continue:  - simvastatin 40mg , 1 tablet at bedtime (patient takes 20mg  (0.5 tablet at bedtime)).  - omega-3 fatty acids 1000mg , 2000mg  once daily  . Allergic rhinitis . Continue:  loratadine 10mg , 1 tablet daily  . Osteoarthritis  . Continue: meloxicam 15mg , 1 tablet daily  . Rosacea . Continue: doxycycline 100mg , 1 tablet twice daily (as needed in the summer)  Patient Self Care Activities:  . Self administers medications as prescribed and Calls provider office for new concerns or questions . Continue current medications as directed by providers.  . Continue working on health habits (diet/ exercise).  Initial goal documentation        Hypertension   BP today is:  <130/80  Office blood pressures are  BP Readings from Last 3 Encounters:  07/02/19 124/72  03/04/18 108/80  03/03/18 139/89   Patient has failed these meds in the past: none   Patient checks BP at home does not check at home   Patient home BP readings are ranging: not checking BP at home  Patient is controlled on:  - lisinopril 10mg , 1 tablet daily - furosemide 20mg , 1 tablet daily   We discussed diet and exercise extensively . Discussed diet modifications. DASH diet:  following a diet emphasizing fruits and vegetables and low-fat dairy products along with whole grains, fish, poultry, and nuts. Reducing red meats and sugars. (patient notes not eating red meat; vegetarian  diet)  . Exercising  . Reducing the amount of salt intake to 1500mg /per day.  . Recommend using a salt substitute to replace your salt if you need flavor.    . Weight reduction- We discussed losing 5-10% of body weight Plan Denies dizziness/ lightheadedness.  Continue current medications and control with diet and exercise.    Hyperlipidemia   Lipid Panel     Component Value Date/Time   CHOL 111 07/02/2019 0826   TRIG 154.0 (H) 07/02/2019 0826   HDL 34.00 (L) 07/02/2019 0826   CHOLHDL 3 07/02/2019 0826   VLDL 30.8 07/02/2019 0826   LDLCALC 46 07/02/2019 0826    The ASCVD Risk score (Goff DC Jr., et al., 2013) failed to calculate for the following reasons:   The valid total cholesterol range is 130 to 320 mg/dL   Patient has failed these meds in past: none  Patient is currently controlled on the following medications:  - simvastatin 40mg , 1 tablet at bedtime (patient takes 20mg  (0.5 tablet at bedtime)  - omega-3 fatty acids 1000mg , 2000mg  once daily   We discussed:  diet and exercise extensively  . How to reduce cholesterol through diet/weight management and physical activity.    . We discussed how  a diet high in plant sterols (fruits/vegetables/nuts/whole grains/legumes) may reduce your cholesterol.  Encouraged increasing fiber to a daily intake of 10-25g/day   Plan Patient mentions having discussion and was okay to continue as is.  Continue current medications  Allergic rhinitis   Patient has failed these meds in past: none  Patient is currently controlled on the following medications:  - loratadine 10mg , 1 tablet daily   Plan Continue current medications.   Hyperparathyroidism   Patient is currently controlled on the following medications:  - no medications  - parathyroidectomy in 03/10/2015   PTH    Component Value Date/Time   PTH 42 07/02/2019 0826   PTH Comment 05/21/2016 0921   Vitamin D: 43.38 (07/02/2019)  Calcium: 9.5 (07/02/2019)   Plan Continue as  is.   Osteoarthritis   Patient has failed these meds in past: none  Patient is currently controlled on the following medications:  - meloxicam 15mg , 1 tablet daily   We discussed:  caution use in setting of HTN. Patient endorses good BP control.   Plan Continue current medications.   Rosacea   Patient has failed these meds in past:  Patient is currently controlled on the following medications:  - doxycycline 100mg , 1 tablet twice daily (most likely to take in the summer)  Plan Continue current medications.   Medication Management  Patient organizes medications: uses pill box  Barriers: denies issues  Adherence: no gaps in fill history (per medication dispense history from 07/10/2019 to 12/31/2019)   Follow up Follow up visit with PharmD in 1 year. Will conduct general telephone calls for periodic check-ins before next visit.   Anson Crofts, PharmD Clinical Pharmacist [facility] [phone]

## 2020-01-06 NOTE — Patient Instructions (Addendum)
Visit Information  Goals Addressed            This Visit's Progress   . Pharmacy Care Plan       CARE PLAN ENTRY  Current Barriers:  . Chronic Disease Management support, education, and care coordination needs related to HTN, HLD, and Allergic rhinitis, hyperparathyroidism, osteoarthritis, rosacea  Pharmacist Clinical Goal(s):  Marland Kitchen Work with the care management team to address educational, disease management, and care coordination needs  . Call provider office for new or worsened signs and symptoms . Call care management team with questions or concerns.  . Blood pressure:  Marland Kitchen Maintain blood pressure within goal of your provider (130/80 or 140/90)  . Last blood pressure reading: 124/72 (07/02/19)  . High cholesterol:  . Cholesterol goals: Total Cholesterol goal under 200, Triglycerides goal under 150, HDL goal above 40 (men) or above 50 (women), LDL goal under 100.  . Current cholesterol levels (07/02/2019): total cholesterol: 111; triglycerides: 154; HDL: 34; LDL: 46. . Allergic rhinitis . Minimize allergy symptoms (runny nose, congestion, etc).  . Osteoarthritis . Continue to see improvement in pain level.  . Rosacea . Minimize appearance of rosacea.  Interventions: . Comprehensive medication review performed. . Evaluation of current treatment plans and patient's adherence to plan as established by provider . Assessed patient's education and care coordination needs . Provided disease specific education to patient. . Blood pressure:  . Discussed need to continue checking blood pressure at home.  . Discussed diet modifications. DASH diet:  following a diet emphasizing fruits and vegetables and low-fat dairy products along with whole grains, fish, poultry, and nuts. Reducing red meats and sugars.  . Smoking cessation  . Exercising- see exercising section below  . Limiting the amount of alcohol to 1 or 2 drinks per day.   . Reducing the amount of salt intake to 1500mg /per day.   . Recommend using a salt substitute to replace your salt if you need flavor.    . Getting enough potassium in your diet equaling 3500-5000mg /day.  This helps to regulate BP by balancing out the effects of salt.   . Weight reduction- We discussed losing 5-10% of body weight . Continue:  - lisinopril 10mg , 1 tablet daily - furosemide 20mg , 1 tablet daily . High cholesterol . How to reduce cholesterol through diet/weight management and physical activity.    . We discussed how a diet high in plant sterols (fruits/vegetables/nuts/whole grains/legumes) may reduce your cholesterol.  Encouraged increasing fiber to a daily intake of 10-25g/day  . Continue:  - simvastatin 40mg , 1 tablet at bedtime (patient takes 20mg  (0.5 tablet at bedtime)).  - omega-3 fatty acids 1000mg , 2000mg  once daily  . Allergic rhinitis . Continue:  loratadine 10mg , 1 tablet daily  . Osteoarthritis  . Continue: meloxicam 15mg , 1 tablet daily  . Rosacea . Continue: doxycycline 100mg , 1 tablet twice daily (as needed in the summer)  Patient Self Care Activities:  . Self administers medications as prescribed and Calls provider office for new concerns or questions . Continue current medications as directed by providers.  . Continue working on health habits (diet/ exercise).  Initial goal documentation        Paul Lowe was given information about Chronic Care Management services today including:  1. CCM service includes personalized support from designated clinical staff supervised by his physician, including individualized plan of care and coordination with other care providers 2. 24/7 contact phone numbers for assistance for urgent and routine care needs. 3. Standard insurance,  coinsurance, copays and deductibles apply for chronic care management only during months in which we provide at least 20 minutes of these services. Most insurances cover these services at 100%, however patients may be responsible for any copay,  coinsurance and/or deductible if applicable. This service may help you avoid the need for more expensive face-to-face services. 4. Only one practitioner may furnish and bill the service in a calendar month. 5. The patient may stop CCM services at any time (effective at the end of the month) by phone call to the office staff.  Patient agreed to services and verbal consent obtained.   The patient verbalized understanding of instructions provided today and agreed to receive a mailed copy of patient instruction and/or educational materials. Telephone follow up appointment with pharmacy team member scheduled for: 12/30/2020  Anson Crofts, PharmD Clinical Pharmacist [facility] [place] [phone]    [place] stands for "Dietary Approaches to Stop Hypertension." The DASH eating plan is a healthy eating plan that has been shown to reduce high blood pressure (hypertension). It may also reduce your risk for type 2 diabetes, heart disease, and stroke. The DASH eating plan may also help with weight loss. What are tips for following this plan?  General guidelines  Avoid eating more than 2,300 mg (milligrams) of salt (sodium) a day. If you have hypertension, you may need to reduce your sodium intake to 1,500 mg a day.  Limit alcohol intake to no more than 1 drink a day for nonpregnant women and 2 drinks a day for men. One drink equals 12 oz of beer, 5 oz of wine, or 1 oz of hard liquor.  Work with your health care provider to maintain a healthy body weight or to lose weight. Ask what an ideal weight is for you.  Get at least 30 minutes of exercise that causes your heart to beat faster (aerobic exercise) most days of the week. Activities may include walking, swimming, or biking.  Work with your health care provider or diet and nutrition specialist (dietitian) to adjust your eating plan to your individual calorie needs. Reading food labels   Check food labels  for the amount of sodium per serving. Choose foods with less than 5 percent of the Daily Value of sodium. Generally, foods with less than 300 mg of sodium per serving fit into this eating plan.  To find whole grains, look for the word "whole" as the first word in the ingredient list. Shopping  Buy products labeled as "low-sodium" or "no salt added."  Buy fresh foods. Avoid canned foods and premade or frozen meals. Cooking  Avoid adding salt when cooking. Use salt-free seasonings or herbs instead of table salt or sea salt. Check with your health care provider or pharmacist before using salt substitutes.  Do not fry foods. Cook foods using healthy methods such as baking, boiling, grilling, and broiling instead.  Cook with heart-healthy oils, such as olive, canola, soybean, or sunflower oil. Meal planning  Eat a balanced diet that includes: ? 5 or more servings of fruits and vegetables each day. At each meal, try to fill half of your plate with fruits and vegetables. ? Up to 6-8 servings of whole grains each day. ? Less than 6 oz of lean meat, poultry, or fish each day. A 3-oz serving of meat is about the same size as a deck of cards. One egg equals 1 oz. ? 2 servings of low-fat dairy each day. ? A  serving of nuts, seeds, or beans 5 times each week. ? Heart-healthy fats. Healthy fats called Omega-3 fatty acids are found in foods such as flaxseeds and coldwater fish, like sardines, salmon, and mackerel.  Limit how much you eat of the following: ? Canned or prepackaged foods. ? Food that is high in trans fat, such as fried foods. ? Food that is high in saturated fat, such as fatty meat. ? Sweets, desserts, sugary drinks, and other foods with added sugar. ? Full-fat dairy products.  Do not salt foods before eating.  Try to eat at least 2 vegetarian meals each week.  Eat more home-cooked food and less restaurant, buffet, and fast food.  When eating at a restaurant, ask that your food  be prepared with less salt or no salt, if possible. What foods are recommended? The items listed may not be a complete list. Talk with your dietitian about what dietary choices are best for you. Grains Whole-grain or whole-wheat bread. Whole-grain or whole-wheat pasta. Brown rice. [place]. Bulgur. Whole-grain and low-sodium cereals. Pita bread. Low-fat, low-sodium crackers. Whole-wheat flour tortillas. Vegetables Fresh or frozen vegetables (raw, steamed, roasted, or grilled). Low-sodium or reduced-sodium tomato and vegetable juice. Low-sodium or reduced-sodium tomato sauce and tomato paste. Low-sodium or reduced-sodium canned vegetables. Fruits All fresh, dried, or frozen fruit. Canned fruit in natural juice (without added sugar). Meat and other protein foods Skinless chicken or [place]. Ground chicken or [place]. Pork with fat trimmed off. Fish and seafood. Egg whites. Dried beans, peas, or lentils. Unsalted nuts, nut butters, and seeds. Unsalted canned beans. Lean cuts of beef with fat trimmed off. Low-sodium, lean deli meat. Dairy Low-fat (1%) or fat-free (skim) milk. Fat-free, low-fat, or reduced-fat cheeses. Nonfat, low-sodium ricotta or cottage cheese. Low-fat or nonfat yogurt. Low-fat, low-sodium cheese. Fats and oils Soft margarine without trans fats. Vegetable oil. Low-fat, reduced-fat, or light mayonnaise and salad dressings (reduced-sodium). Canola, safflower, olive, soybean, and sunflower oils. Avocado. Seasoning and other foods Herbs. Spices. Seasoning mixes without salt. Unsalted popcorn and pretzels. Fat-free sweets. What foods are not recommended? The items listed may not be a complete list. Talk with your dietitian about what dietary choices are best for you. Grains Baked goods made with fat, such as croissants, muffins, or some breads. Dry pasta or rice meal packs. Vegetables Creamed or fried vegetables. Vegetables in a cheese sauce. Regular canned vegetables (not  low-sodium or reduced-sodium). Regular canned tomato sauce and paste (not low-sodium or reduced-sodium). Regular tomato and vegetable juice (not low-sodium or reduced-sodium). Angie Fava. Olives. Fruits Canned fruit in a light or heavy syrup. Fried fruit. Fruit in cream or butter sauce. Meat and other protein foods Fatty cuts of meat. Ribs. Fried meat. Berniece Salines. Sausage. Bologna and other processed lunch meats. Salami. Fatback. Hotdogs. Bratwurst. Salted nuts and seeds. Canned beans with added salt. Canned or smoked fish. Whole eggs or egg yolks. Chicken or [place] with skin. Dairy Whole or 2% milk, cream, and half-and-half. Whole or full-fat cream cheese. Whole-fat or sweetened yogurt. Full-fat cheese. Nondairy creamers. Whipped toppings. Processed cheese and cheese spreads. Fats and oils Butter. Stick margarine. Lard. Shortening. Ghee. Bacon fat. Tropical oils, such as coconut, palm kernel, or palm oil. Seasoning and other foods Salted popcorn and pretzels. Onion salt, garlic salt, seasoned salt, table salt, and sea salt. Worcestershire sauce. Tartar sauce. Barbecue sauce. Teriyaki sauce. Soy sauce, including reduced-sodium. Steak sauce. Canned and packaged gravies. Fish sauce. Oyster sauce. Cocktail sauce. Horseradish that you find on the shelf. Ketchup. Mustard.  Meat flavorings and tenderizers. Bouillon cubes. Hot sauce and Tabasco sauce. Premade or packaged marinades. Premade or packaged taco seasonings. Relishes. Regular salad dressings. Where to find more information:  National Heart, Lung, and [place]: https://wilson-eaton.com/  American Heart Association: www.heart.org Summary  The DASH eating plan is a healthy eating plan that has been shown to reduce high blood pressure (hypertension). It may also reduce your risk for type 2 diabetes, heart disease, and stroke.  With the DASH eating plan, you should limit salt (sodium) intake to 2,300 mg a day. If you have hypertension, you may need to reduce  your sodium intake to 1,500 mg a day.  When on the DASH eating plan, aim to eat more fresh fruits and vegetables, whole grains, lean proteins, low-fat dairy, and heart-healthy fats.  Work with your health care provider or diet and nutrition specialist (dietitian) to adjust your eating plan to your individual calorie needs. This information is not intended to replace advice given to you by your health care provider. Make sure you discuss any questions you have with your health care provider. Document Revised: 09/13/2017 Document Reviewed: 09/24/2016 Elsevier Patient Education  2020 [employer].

## 2020-04-06 DIAGNOSIS — L821 Other seborrheic keratosis: Secondary | ICD-10-CM | POA: Diagnosis not present

## 2020-04-06 DIAGNOSIS — L812 Freckles: Secondary | ICD-10-CM | POA: Diagnosis not present

## 2020-04-06 DIAGNOSIS — L570 Actinic keratosis: Secondary | ICD-10-CM | POA: Diagnosis not present

## 2020-04-06 DIAGNOSIS — D1801 Hemangioma of skin and subcutaneous tissue: Secondary | ICD-10-CM | POA: Diagnosis not present

## 2020-05-10 ENCOUNTER — Other Ambulatory Visit: Payer: Self-pay

## 2020-06-07 ENCOUNTER — Other Ambulatory Visit: Payer: Self-pay | Admitting: Adult Health

## 2020-06-07 DIAGNOSIS — I10 Essential (primary) hypertension: Secondary | ICD-10-CM

## 2020-06-24 DIAGNOSIS — D3130 Benign neoplasm of unspecified choroid: Secondary | ICD-10-CM | POA: Diagnosis not present

## 2020-07-29 ENCOUNTER — Telehealth: Payer: Self-pay | Admitting: Adult Health

## 2020-07-29 NOTE — Telephone Encounter (Signed)
Left message for patient to schedule Annual Wellness Visit.  Please schedule with Nurse Health Advisor Shannon Crews, RN at [facility]  

## 2020-08-03 ENCOUNTER — Other Ambulatory Visit: Payer: Self-pay

## 2020-08-03 ENCOUNTER — Ambulatory Visit (INDEPENDENT_AMBULATORY_CARE_PROVIDER_SITE_OTHER): Payer: PPO

## 2020-08-03 DIAGNOSIS — Z0000 Encounter for general adult medical examination without abnormal findings: Secondary | ICD-10-CM | POA: Diagnosis not present

## 2020-08-03 NOTE — Progress Notes (Signed)
Subjective:   Paul Lowe is a 79 y.o. male who presents for Medicare Annual/Subsequent preventive examination.  I connected with Paul Lowe today by telephone and verified that I am speaking with the correct person using two identifiers. Location patient: home Location provider: work Persons participating in the virtual visit: patient, provider.   I discussed the limitations, risks, security and privacy concerns of performing an evaluation and management service by telephone and the availability of in person appointments. I also discussed with the patient that there may be a patient responsible charge related to this service. The patient expressed understanding and verbally consented to this telephonic visit.    Interactive audio and video telecommunications were attempted between this provider and patient, however failed, due to patient having technical difficulties OR patient did not have access to video capability.  We continued and completed visit with audio only.      Review of Systems    N/A Cardiac Risk Factors include: advanced age (>36men, >85 women);male gender;hypertension     Objective:    Today's Vitals   There is no height or weight on file to calculate BMI.  Advanced Directives 08/03/2020 03/04/2018 05/23/2017 02/28/2015  Does Patient Have a Medical Advance Directive? Yes Yes Yes Yes  Type of Paramedic of [place];Living will - [place];Living will Living will  Does patient want to make changes to medical advance directive? No - Patient declined - - -  Copy of [place] in Chart? No - copy requested - - No - copy requested    Current Medications (verified) Outpatient Encounter Medications as of 08/03/2020  Medication Sig  . Ascorbic Acid (VITAMIN C) 100 MG tablet Take 500 mg by mouth daily.   Marland Kitchen doxycycline (VIBRA-TABS) 100 MG tablet Take 100 mg by mouth 2 (two) times daily. FOR ROSACEA  .  fish oil-omega-3 fatty acids 1000 MG capsule Take 1 g by mouth daily.   . furosemide (LASIX) 20 MG tablet TAKE 1 TABLET(20 MG) BY MOUTH DAILY  . lisinopril (ZESTRIL) 10 MG tablet TAKE 1 TABLET BY MOUTH DAILY  . loratadine (CLARITIN) 10 MG tablet Take 10 mg by mouth daily.    . meloxicam (MOBIC) 15 MG tablet Take 1 tablet (15 mg total) by mouth daily.  . Multiple Vitamin tablet Take 1 a day  . simvastatin (ZOCOR) 40 MG tablet TAKE 1 TABLET(40 MG) BY MOUTH AT BEDTIME (Patient taking differently: 20 mg daily. Patient cuts tablets in half)   No facility-administered encounter medications on file as of 08/03/2020.    Allergies (verified) Shrimp flavor   History: Past Medical History:  Diagnosis Date  . Adult acne   . Allergic rhinitis   . Asthma   . Cataracts, bilateral   . Hyperlipidemia   . Hypertension   . Idiopathic parathyroidism ([place])   . Osteoarthritis   . Seasonal allergies   . Seizure ([place]) 1990's   no seizure activity since   Past Surgical History:  Procedure Laterality Date  . anal cyst    . APPENDECTOMY    . CATARACT EXTRACTION, BILATERAL    . PARATHYROIDECTOMY N/A 03/10/2015   Procedure: PARATHYROIDECTOMY;  Surgeon: Armandina Gemma, MD;  Location: [facility];  Service: General;  Laterality: N/A;  . REPLACEMENT TOTAL KNEE Bilateral    Family History  Problem Relation Age of Onset  . Stroke Mother   . Colon cancer Neg Hx   . Esophageal cancer Neg Hx   . Rectal  cancer Neg Hx   . Stomach cancer Neg Hx    Social History   Socioeconomic History  . Marital status: Married    Spouse name: Not on file  . Number of children: Not on file  . Years of education: Not on file  . Highest education level: Not on file  Occupational History  . Not on file  Tobacco Use  . Smoking status: Never Smoker  . Smokeless tobacco: Never Used  Substance and Sexual Activity  . Alcohol use: Yes    Alcohol/week: 2.0 standard drinks    Types: 2 Cans of beer per week  . Drug use: No  .  Sexual activity: Not on file  Other Topics Concern  . 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 Difficulty of Paying Living Expenses: Not hard at all  Food Insecurity: No Food Insecurity  . Worried About Charity fundraiser in the Last Year: Never true  . Ran Out of Food in the Last Year: Never true  Transportation Needs: No Transportation Needs  . Lack of Transportation (Medical): No  . Lack of Transportation (Non-Medical): No  Physical Activity: Inactive  . Days of Exercise per Week: 0 days  . Minutes of Exercise per Session: 0 min  Stress: No Stress Concern Present  . Feeling of Stress : Not at all  Social Connections: Moderately Isolated  . Frequency of Communication with Friends and Family: Once a week  . Frequency of Social Gatherings with Friends and Family: Once a week  . Attends Religious Services: Never  . Active Member of Clubs or Organizations: Yes  . Attends Archivist Meetings: More than 4 times per year  . Marital Status: Married    Tobacco Counseling Counseling given: Not Answered   Clinical Intake:  Pre-visit preparation completed: Yes  Pain : No/denies pain     Nutritional Risks: None Diabetes: No  How often do you need to have someone help you when you read instructions, pamphlets, or other written materials from your doctor or pharmacy?: 1 - Never What is the last grade level you completed in school?: College  Diabetic?No  Interpreter Needed?: No  Information entered by :: [place] of Daily Living In your present state of health, do you have any difficulty performing the following activities: 08/03/2020  Hearing? N  Vision? N  Difficulty concentrating or making decisions? Y  Walking or climbing stairs? Y  Comment Has arthritis has some pain    Dressing or bathing? N  Doing errands, shopping? N  Preparing Food and eating ? N  Using the Toilet? N  In the past six months, have you accidently leaked urine? N  Do you have problems with loss of bowel control? N  Managing your Medications? N  Managing your Finances? N  Housekeeping or managing your Housekeeping? N  Some recent data might be hidden    Patient Care Team: [name], NP as PCP - General (Family Medicine) Earnie Larsson, [facility] as Pharmacist (Pharmacist)  Indicate any recent Medical Services you may have received from other than [facility] providers in the past year (date may be approximate).     Assessment:   This is a routine wellness examination for  Paul Lowe.  Hearing/Vision screen  Hearing Screening   125Hz  250Hz  500Hz  1000Hz  2000Hz  3000Hz  4000Hz  6000Hz  8000Hz   Right ear:           Left ear:           Vision Screening Comments: Patient states gets eye examined yearly   Dietary issues and exercise activities discussed: Current Exercise Habits: The patient does not participate in regular exercise at present, Exercise limited by: None identified  Goals    . Exercise 3x per week (30 min per time)    . Patient Stated     Exercise more;  Maintain your health   Podiatrist - just to track your exercise     . Pharmacy Care Plan     CARE PLAN ENTRY  Current Barriers:  . Chronic Disease Management support, education, and care coordination needs related to HTN, HLD, and Allergic rhinitis, hyperparathyroidism, osteoarthritis, rosacea  Pharmacist Clinical Goal(s):  Marland Kitchen Work with the care management team to address educational, disease management, and care coordination needs  . Call provider office for new or worsened signs and symptoms . Call care management team with questions or concerns.  . Blood pressure:  Marland Kitchen Maintain blood pressure within goal of your provider (130/80 or 140/90)  . Last blood pressure reading: 124/72 (07/02/19)  . High cholesterol:   . Cholesterol goals: Total Cholesterol goal under 200, Triglycerides goal under 150, HDL goal above 40 (men) or above 50 (women), LDL goal under 100.  . Current cholesterol levels (07/02/2019): total cholesterol: 111; triglycerides: 154; HDL: 34; LDL: 46. . Allergic rhinitis . Minimize allergy symptoms (runny nose, congestion, etc).  . Osteoarthritis . Continue to see improvement in pain level.  . Rosacea . Minimize appearance of rosacea.  Interventions: . Comprehensive medication review performed. . Evaluation of current treatment plans and patient's adherence to plan as established by provider . Assessed patient's education and care coordination needs . Provided disease specific education to patient. . Blood pressure:  . Discussed need to continue checking blood pressure at home.  . Discussed diet modifications. DASH diet:  following a diet emphasizing fruits and vegetables and low-fat dairy products along with whole grains, fish, poultry, and nuts. Reducing red meats and sugars.  . Smoking cessation  . Exercising- see exercising section below  . Limiting the amount of alcohol to 1 or 2 drinks per day.   . Reducing the amount of salt intake to 1500mg /per day.  . Recommend using a salt substitute to replace your salt if you need flavor.    . Getting enough potassium in your diet equaling 3500-5000mg /day.  This helps to regulate BP by balancing out the effects of salt.   . Weight reduction- We discussed losing 5-10% of body weight . Continue:  - lisinopril 10mg , 1 tablet daily - furosemide 20mg , 1 tablet daily . High cholesterol . How to reduce cholesterol through diet/weight management and physical activity.    . We discussed how a diet high in plant sterols (fruits/vegetables/nuts/whole grains/legumes) may reduce your cholesterol.  Encouraged increasing fiber to a daily intake of 10-25g/day  . Continue:  - simvastatin 40mg , 1 tablet at bedtime (patient takes 20mg  (0.5 tablet at  bedtime)).  - omega-3 fatty acids 1000mg , 2000mg  once daily  . Allergic rhinitis . Continue:  loratadine 10mg , 1 tablet daily  . Osteoarthritis  . Continue: meloxicam 15mg , 1 tablet daily  . Rosacea . Continue: doxycycline 100mg , 1 tablet twice daily (as needed in the summer)  Patient Self Care  Activities:  . Self administers medications as prescribed and Calls provider office for new concerns or questions . Continue current medications as directed by providers.  . Continue working on health habits (diet/ exercise).  Initial goal documentation       Depression Screen PHQ 2/9 Scores 08/03/2020 03/04/2018 02/18/2018 11/20/2016 12/12/2015 09/30/2014 09/29/2013  PHQ - 2 Score 0 0 0 0 0 0 0  PHQ- 9 Score 0 - - - - - -    Fall Risk Fall Risk  08/03/2020 09/07/2019 02/18/2018 11/20/2016 12/12/2015  Falls in the past year? 0 0 No No No  Comment - Emmi Telephone Survey: data to providers prior to load - - -  Number falls in past yr: 0 - - - -  Injury with Fall? 0 - - - -  Risk for fall due to : No Fall Risks - - - -  Follow up Falls evaluation completed;Falls prevention discussed - - - -    Any stairs in or around the home? Yes  If so, are there any without handrails? No  Home free of loose throw rugs in walkways, pet beds, electrical cords, etc? Yes  Adequate lighting in your home to reduce risk of falls? Yes   ASSISTIVE DEVICES UTILIZED TO PREVENT FALLS:  Life alert? No  Use of a cane, walker or w/c? No  Grab bars in the bathroom? No  Shower chair or bench in shower? No  Elevated toilet seat or a handicapped toilet? Yes     Cognitive Function: MMSE - Mini Mental State Exam 03/04/2018 03/04/2018  Not completed: (No Data) (No Data)     6CIT Screen 08/03/2020  What Year? 0 points  What month? 0 points  What time? 0 points  Count back from 20 0 points  Months in reverse 0 points  Repeat phrase 0 points  Total Score 0    Immunizations Immunization History  Administered Date(s)  Administered  . Hepatitis A 06/29/2008  . Influenza Split 07/19/2011, 07/11/2012  . Influenza Whole 07/07/2008, 08/24/2009, 07/21/2010  . Influenza, High Dose Seasonal PF 07/19/2015, 07/03/2018, 06/14/2019  . Influenza,inj,Quad PF,6+ Mos 07/03/2013, 07/09/2014  . Influenza-Unspecified 07/10/2016  . PFIZER SARS-COV-2 Vaccination 10/30/2019, 11/18/2019  . Pneumococcal Conjugate-13 09/29/2013, 09/30/2014  . Pneumococcal Polysaccharide-23 05/24/2009  . Td 10/15/2004  . Tdap 12/19/2015  . Zoster 07/21/2010    TDAP status: Up to date Flu Vaccine status: Up to date Pneumococcal vaccine status: Up to date Covid-19 vaccine status: Completed vaccines  Qualifies for Shingles Vaccine? Yes   Zostavax completed Yes   Shingrix Completed?: No.    Education has been provided regarding the importance of this vaccine. Patient has been advised to call insurance company to determine out of pocket expense if they have not yet received this vaccine. Advised may also receive vaccine at local pharmacy or Health Dept. Verbalized acceptance and understanding.  Screening Tests Health Maintenance  Topic Date Due  . Hepatitis C Screening  Never done  . INFLUENZA VACCINE  05/15/2020  . TETANUS/TDAP  12/18/2025  . COVID-19 Vaccine  Completed  . PNA vac Low Risk Adult  Completed    Health Maintenance  Health Maintenance Due  Topic Date Due  . Hepatitis C Screening  Never done  . INFLUENZA VACCINE  05/15/2020    Colorectal cancer screening: Completed 05/23/2017. Repeat every 10 years  Lung Cancer Screening: (Low Dose CT Chest recommended if Age 21-80 years, 30 pack-year currently smoking OR have quit w/in 15years.) does not qualify.  Lung Cancer Screening Referral: N/A  Additional Screening:  Hepatitis C Screening: does qualify;   Vision Screening: Recommended annual ophthalmology exams for early detection of glaucoma and other disorders of the eye. Is the patient up to date with their annual eye  exam?  Yes  Who is the provider or what is the name of the office in which the patient attends annual eye exams? [facility]  If pt is not established with a provider, would they like to be referred to a provider to establish care? No .   Dental Screening: Recommended annual dental exams for proper oral hygiene  Community Resource Referral / Chronic Care Management: CRR required this visit?  No   CCM required this visit?  No      Plan:     I have personally reviewed and noted the following in the patient's chart:   . Medical and social history . Use of alcohol, tobacco or illicit drugs  . Current medications and supplements . Functional ability and status . Nutritional status . Physical activity . Advanced directives . List of other physicians . Hospitalizations, surgeries, and ER visits in previous 12 months . Vitals . Screenings to include cognitive, depression, and falls . Referrals and appointments  In addition, I have reviewed and discussed with patient certain preventive protocols, quality metrics, and best practice recommendations. A written personalized care plan for preventive services as well as general preventive health recommendations were provided to patient.     Ofilia Neas, LPN   [id number]   Nurse Notes: None

## 2020-08-03 NOTE — Patient Instructions (Signed)
[name] , Thank you for taking time to come for your Medicare Wellness Visit. I appreciate your ongoing commitment to your health goals. Please review the following plan we discussed and let me know if I can assist you in the future.   Screening recommendations/referrals: Colonoscopy: No longer required  Recommended yearly ophthalmology/optometry visit for glaucoma screening and checkup Recommended yearly dental visit for hygiene and checkup  Vaccinations: Influenza vaccine: Currently due, you may receive in our office at your next appointment  Pneumococcal vaccine: Completed series Tdap vaccine: Up to date, next due 12/18/2025 Shingles vaccine: Please check with your pharmacy and notify us if you have received your Shingrix    Advanced directives: Please bring copies of your Advanced Medical directives into the office so that we may scan them into your chart  Conditions/risks identified: Please try to incorporate at least 30 minutes of moderate exercise at least 3 days per week into your routine   Next appointment: 08/12/2020 @ 8:00 am with [name], NP for your Physical. Please come in fasting so labs may be drawn  Preventive Care 65 Years and Older, Male Preventive care refers to lifestyle choices and visits with your health care provider that can promote health and wellness. What does preventive care include?  A yearly physical exam. This is also called an annual well check.  Dental exams once or twice a year.  Routine eye exams. Ask your health care provider how often you should have your eyes checked.  Personal lifestyle choices, including:  Daily care of your teeth and gums.  Regular physical activity.  Eating a healthy diet.  Avoiding tobacco and drug use.  Limiting alcohol use.  Practicing safe sex.  Taking low doses of aspirin every day.  Taking vitamin and mineral supplements as recommended by your health care provider. What happens during an annual  well check? The services and screenings done by your health care provider during your annual well check will depend on your age, overall health, lifestyle risk factors, and family history of disease. Counseling  Your health care provider may ask you questions about your:  Alcohol use.  Tobacco use.  Drug use.  Emotional well-being.  Home and relationship well-being.  Sexual activity.  Eating habits.  History of falls.  Memory and ability to understand (cognition).  Work and work Statistician. Screening  You may have the following tests or measurements:  Height, weight, and BMI.  Blood pressure.  Lipid and cholesterol levels. These may be checked every 5 years, or more frequently if you are over 17 years old.  Skin check.  Lung cancer screening. You may have this screening every year starting at age 3 if you have a 30-pack-year history of smoking and currently smoke or have quit within the past 15 years.  Fecal occult blood test (FOBT) of the stool. You may have this test every year starting at age 76.  Flexible sigmoidoscopy or colonoscopy. You may have a sigmoidoscopy every 5 years or a colonoscopy every 10 years starting at age 92.  Prostate cancer screening. Recommendations will vary depending on your family history and other risks.  Hepatitis C blood test.  Hepatitis B blood test.  Sexually transmitted disease (STD) testing.  Diabetes screening. This is done by checking your blood sugar (glucose) after you have not eaten for a while (fasting). You may have this done every 1-3 years.  Abdominal aortic aneurysm (AAA) screening. You may need this if you are a current or former smoker.  Osteoporosis. You may be screened starting at age 4 if you are at high risk. Talk with your health care provider about your test results, treatment options, and if necessary, the need for more tests. Vaccines  Your health care provider may recommend certain vaccines, such  as:  Influenza vaccine. This is recommended every year.  Tetanus, diphtheria, and acellular pertussis (Tdap, Td) vaccine. You may need a Td booster every 10 years.  Zoster vaccine. You may need this after age 39.  Pneumococcal 13-valent conjugate (PCV13) vaccine. One dose is recommended after age 46.  Pneumococcal polysaccharide (PPSV23) vaccine. One dose is recommended after age 66. Talk to your health care provider about which screenings and vaccines you need and how often you need them. This information is not intended to replace advice given to you by your health care provider. Make sure you discuss any questions you have with your health care provider. Document Released: 10/28/2015 Document Revised: 06/20/2016 Document Reviewed: 08/02/2015 Elsevier Interactive Patient Education  2017 [place] Prevention in the Home Falls can cause injuries. They can happen to people of all ages. There are many things you can do to make your home safe and to help prevent falls. What can I do on the outside of my home?  Regularly fix the edges of walkways and driveways and fix any cracks.  Remove anything that might make you trip as you walk through a door, such as a raised step or threshold.  Trim any bushes or trees on the path to your home.  Use bright outdoor lighting.  Clear any walking paths of anything that might make someone trip, such as rocks or tools.  Regularly check to see if handrails are loose or broken. Make sure that both sides of any steps have handrails.  Any raised decks and porches should have guardrails on the edges.  Have any leaves, snow, or ice cleared regularly.  Use sand or salt on walking paths during winter.  Clean up any spills in your garage right away. This includes oil or grease spills. What can I do in the bathroom?  Use night lights.  Install grab bars by the toilet and in the tub and shower. Do not use towel bars as grab bars.  Use  non-skid mats or decals in the tub or shower.  If you need to sit down in the shower, use a plastic, non-slip stool.  Keep the floor dry. Clean up any water that spills on the floor as soon as it happens.  Remove soap buildup in the tub or shower regularly.  Attach bath mats securely with double-sided non-slip rug tape.  Do not have throw rugs and other things on the floor that can make you trip. What can I do in the bedroom?  Use night lights.  Make sure that you have a light by your bed that is easy to reach.  Do not use any sheets or blankets that are too big for your bed. They should not hang down onto the floor.  Have a firm chair that has side arms. You can use this for support while you get dressed.  Do not have throw rugs and other things on the floor that can make you trip. What can I do in the kitchen?  Clean up any spills right away.  Avoid walking on wet floors.  Keep items that you use a lot in easy-to-reach places.  If you need to reach something above you, use a strong step  stool that has a grab bar.  Keep electrical cords out of the way.  Do not use floor polish or wax that makes floors slippery. If you must use wax, use non-skid floor wax.  Do not have throw rugs and other things on the floor that can make you trip. What can I do with my stairs?  Do not leave any items on the stairs.  Make sure that there are handrails on both sides of the stairs and use them. Fix handrails that are broken or loose. Make sure that handrails are as long as the stairways.  Check any carpeting to make sure that it is firmly attached to the stairs. Fix any carpet that is loose or worn.  Avoid having throw rugs at the top or bottom of the stairs. If you do have throw rugs, attach them to the floor with carpet tape.  Make sure that you have a light switch at the top of the stairs and the bottom of the stairs. If you do not have them, ask someone to add them for you. What  else can I do to help prevent falls?  Wear shoes that:  Do not have high heels.  Have rubber bottoms.  Are comfortable and fit you well.  Are closed at the toe. Do not wear sandals.  If you use a stepladder:  Make sure that it is fully opened. Do not climb a closed stepladder.  Make sure that both sides of the stepladder are locked into place.  Ask someone to hold it for you, if possible.  Clearly mark and make sure that you can see:  Any grab bars or handrails.  First and last steps.  Where the edge of each step is.  Use tools that help you move around (mobility aids) if they are needed. These include:  Canes.  Walkers.  Scooters.  Crutches.  Turn on the lights when you go into a dark area. Replace any light bulbs as soon as they burn out.  Set up your furniture so you have a clear path. Avoid moving your furniture around.  If any of your floors are uneven, fix them.  If there are any pets around you, be aware of where they are.  Review your medicines with your doctor. Some medicines can make you feel dizzy. This can increase your chance of falling. Ask your doctor what other things that you can do to help prevent falls. This information is not intended to replace advice given to you by your health care provider. Make sure you discuss any questions you have with your health care provider. Document Released: 07/28/2009 Document Revised: 03/08/2016 Document Reviewed: 11/05/2014 Elsevier Interactive Patient Education  2017 [employer].

## 2020-08-12 ENCOUNTER — Ambulatory Visit (INDEPENDENT_AMBULATORY_CARE_PROVIDER_SITE_OTHER): Payer: PPO | Admitting: Adult Health

## 2020-08-12 ENCOUNTER — Other Ambulatory Visit: Payer: Self-pay | Admitting: Adult Health

## 2020-08-12 ENCOUNTER — Other Ambulatory Visit: Payer: Self-pay

## 2020-08-12 ENCOUNTER — Encounter: Payer: Self-pay | Admitting: Adult Health

## 2020-08-12 VITALS — BP 132/80 | HR 65 | Temp 98.2°F | Ht 70.0 in | Wt 192.6 lb

## 2020-08-12 DIAGNOSIS — R351 Nocturia: Secondary | ICD-10-CM

## 2020-08-12 DIAGNOSIS — Z0000 Encounter for general adult medical examination without abnormal findings: Secondary | ICD-10-CM | POA: Diagnosis not present

## 2020-08-12 DIAGNOSIS — I10 Essential (primary) hypertension: Secondary | ICD-10-CM

## 2020-08-12 DIAGNOSIS — M1990 Unspecified osteoarthritis, unspecified site: Secondary | ICD-10-CM | POA: Diagnosis not present

## 2020-08-12 DIAGNOSIS — N401 Enlarged prostate with lower urinary tract symptoms: Secondary | ICD-10-CM

## 2020-08-12 DIAGNOSIS — Z23 Encounter for immunization: Secondary | ICD-10-CM | POA: Diagnosis not present

## 2020-08-12 DIAGNOSIS — E210 Primary hyperparathyroidism: Secondary | ICD-10-CM | POA: Diagnosis not present

## 2020-08-12 DIAGNOSIS — E782 Mixed hyperlipidemia: Secondary | ICD-10-CM | POA: Diagnosis not present

## 2020-08-12 DIAGNOSIS — Z1159 Encounter for screening for other viral diseases: Secondary | ICD-10-CM

## 2020-08-12 NOTE — Patient Instructions (Signed)
It was great seeing you today and congratulations on retirement.   You look great and sound great!  Work on exercising more often   We will follow up with you regarding your blood work   I will see you back in one year or sooner if needed

## 2020-08-12 NOTE — Progress Notes (Signed)
Subjective:    Patient ID: Paul Lowe, male    DOB: 1941/09/25, 79 y.o.   MRN: [id number]  HPI Patient presents for yearly preventative medicine examination. He is a pleasant 79 year old male who  has a past medical history of Adult acne, Allergic rhinitis, Asthma, Cataracts, bilateral, Hyperlipidemia, Hypertension, Idiopathic parathyroidism ([place]), Osteoarthritis, Seasonal allergies, and Seizure ([place]) (1990's).   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  He denies myalgia or fatigue Lab Results  Component Value Date   CHOL 111 07/02/2019   HDL 34.00 (L) 07/02/2019   LDLCALC 46 07/02/2019   TRIG 154.0 (H) 07/02/2019   CHOLHDL 3 07/02/2019    Essential hypertension-takes lisinopril 10 mg daily and Lasix 20 mg daily.  He denies dizziness, lightheadedness, chest pain, or shortness of breath BP Readings from Last 3 Encounters:  08/12/20 132/80  07/02/19 124/72  03/04/18 108/80    Rosacea-takes doxycycline 100 mg twice daily  Osteoarthritis-takes Mobic 15 mg daily as needed for pain control and feels as though his pain is adequately controlled with this medication  Hyperparathyroidism -had a parathyroidectomy in May 2016.  Not currently on any medications and doing well  BPH - Asymptomatic   All immunizations and health maintenance protocols were reviewed with the patient and needed orders were placed. He is going to get a flu shot today   Appropriate screening laboratory values were ordered for the patient including screening of hyperlipidemia, renal function and hepatic function.  Medication reconciliation,  past medical history, social history, problem list and allergies were reviewed in detail with the patient  Goals were established with regard to weight loss, exercise, and  diet in compliance with medications Wt Readings from Last 3 Encounters:  08/12/20 192 lb 9.6 oz (87.4 kg)  07/02/19 197  lb 12.8 oz (89.7 kg)  03/04/18 188 lb (85.3 kg)   Review of Systems  Constitutional: Negative.   HENT: Negative.   Eyes: Negative.   Respiratory: Negative.   Cardiovascular: Negative.   Gastrointestinal: Negative.   Endocrine: Negative.   Genitourinary: Negative.   Musculoskeletal: Negative.   Skin: Negative.   Allergic/Immunologic: Negative.   Neurological: Negative.   Hematological: Negative.   Psychiatric/Behavioral: Negative.   All other systems reviewed and are negative.  Past Medical History:  Diagnosis Date  . Adult acne   . Allergic rhinitis   . Asthma   . Cataracts, bilateral   . Hyperlipidemia   . Hypertension   . Idiopathic parathyroidism ([place])   . Osteoarthritis   . Seasonal allergies   . Seizure ([place]) 1990's   no seizure activity since    Social History   Socioeconomic History  . Marital status: Married    Spouse name: Not on file  . Number of children: Not on file  . Years of education: Not on file  . Highest education level: Not on file  Occupational History  . Not on file  Tobacco Use  . Smoking status: Never Smoker  . Smokeless tobacco: Never Used  Substance and Sexual Activity  . Alcohol use: Yes    Alcohol/week: 2.0 standard drinks    Types: 2 Cans of beer per week  . Drug use: No  . Sexual activity: Not on file  Other Topics Concern  . 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 Difficulty of Paying Living Expenses: Not hard at all  Food Insecurity: No Food Insecurity  . Worried About Charity fundraiser in the Last Year: Never true  . Ran Out of Food in the Last Year: Never true  Transportation Needs: No Transportation Needs  . Lack of Transportation (Medical): No  . Lack of Transportation (Non-Medical): No  Physical Activity: Inactive  . Days of Exercise per  Week: 0 days  . Minutes of Exercise per Session: 0 min  Stress: No Stress Concern Present  . Feeling of Stress : Not at all  Social Connections: Moderately Isolated  . Frequency of Communication with Friends and Family: Once a week  . Frequency of Social Gatherings with Friends and Family: Once a week  . Attends Religious Services: Never  . Active Member of Clubs or Organizations: Yes  . Attends Archivist Meetings: More than 4 times per year  . Marital Status: Married  Human resources officer Violence: Not At Risk  . Fear of Current or Ex-Partner: No  . Emotionally Abused: No  . Physically Abused: No  . Sexually Abused: No    Past Surgical History:  Procedure Laterality Date  . anal cyst    . APPENDECTOMY    . CATARACT EXTRACTION, BILATERAL    . PARATHYROIDECTOMY N/A 03/10/2015   Procedure: PARATHYROIDECTOMY;  Surgeon: Armandina Gemma, MD;  Location: [facility];  Service: General;  Laterality: N/A;  . REPLACEMENT TOTAL KNEE Bilateral     Family History  Problem Relation Age of Onset  . Stroke Mother   . Colon cancer Neg Hx   . Esophageal cancer Neg Hx   . Rectal cancer Neg Hx   . Stomach cancer Neg Hx     Allergies  Allergen Reactions  . Shrimp Flavor     REACTION: rash    Current Outpatient Medications on File Prior to Visit  Medication Sig Dispense Refill  . Ascorbic Acid (VITAMIN C) 100 MG tablet Take 500 mg by mouth daily.     Marland Kitchen doxycycline (VIBRA-TABS) 100 MG tablet Take 100 mg by mouth 2 (two) times daily. FOR ROSACEA    . fish oil-omega-3 fatty acids 1000 MG capsule Take 1 g by mouth daily.     . furosemide (LASIX) 20 MG tablet TAKE 1 TABLET(20 MG) BY MOUTH DAILY 90 tablet 3  . lisinopril (ZESTRIL) 10 MG tablet TAKE 1 TABLET BY MOUTH DAILY 90 tablet 3  . loratadine (CLARITIN) 10 MG tablet Take 10 mg by mouth daily.      . meloxicam (MOBIC) 15 MG tablet Take 1 tablet (15 mg total) by mouth daily. 90 tablet 3  . Multiple Vitamin tablet Take 1 a day 30 tablet 0  .  simvastatin (ZOCOR) 40 MG tablet TAKE 1 TABLET(40 MG) BY MOUTH AT BEDTIME (Patient taking differently: 20 mg daily. Patient cuts tablets in half) 90 tablet 3   No current facility-administered medications on file prior to visit.    BP 132/80   Pulse 65   Temp 98.2 F (36.8 C) (Oral)   Ht _0  (1.778 m)   Wt 192 lb 9.6 oz (87.4 kg)   SpO2 98%   BMI 27.64 kg/m       Objective:   Physical Exam Vitals and nursing note reviewed.  Constitutional:  General: He is not in acute distress.    Appearance: Normal appearance. He is well-developed and normal weight.  HENT:     Head: Normocephalic and atraumatic.     Right Ear: Tympanic membrane, ear canal and external ear normal. There is no impacted cerumen.     Left Ear: Tympanic membrane, ear canal and external ear normal. There is no impacted cerumen.     Nose: Nose normal. No congestion or rhinorrhea.     Mouth/Throat:     Mouth: Mucous membranes are moist.     Pharynx: Oropharynx is clear. No oropharyngeal exudate or posterior oropharyngeal erythema.  Eyes:     General:        Right eye: No discharge.        Left eye: No discharge.     Extraocular Movements: Extraocular movements intact.     Conjunctiva/sclera: Conjunctivae normal.     Pupils: Pupils are equal, round, and reactive to light.  Neck:     Vascular: No carotid bruit.     Trachea: No tracheal deviation.  Cardiovascular:     Rate and Rhythm: Normal rate and regular rhythm.     Pulses: Normal pulses.     Heart sounds: Normal heart sounds. No murmur heard.  No friction rub. No gallop.   Pulmonary:     Effort: Pulmonary effort is normal. No respiratory distress.     Breath sounds: Normal breath sounds. No stridor. No wheezing, rhonchi or rales.  Chest:     Chest wall: No tenderness.  Abdominal:     General: Bowel sounds are normal. There is no distension.     Palpations: Abdomen is soft. There is no mass.     Tenderness: There is no abdominal tenderness.  There is no right CVA tenderness, left CVA tenderness, guarding or rebound.     Hernia: No hernia is present.  Musculoskeletal:        General: No swelling, tenderness, deformity or signs of injury. Normal range of motion.     Right lower leg: No edema.     Left lower leg: No edema.  Lymphadenopathy:     Cervical: No cervical adenopathy.  Skin:    General: Skin is warm and dry.     Capillary Refill: Capillary refill takes less than 2 seconds.     Coloration: Skin is not jaundiced or pale.     Findings: No bruising, erythema, lesion or rash.  Neurological:     General: No focal deficit present.     Mental Status: He is alert and oriented to person, place, and time.     Cranial Nerves: No cranial nerve deficit.     Sensory: No sensory deficit.     Motor: No weakness.     Coordination: Coordination normal.     Gait: Gait normal.     Deep Tendon Reflexes: Reflexes normal.  Psychiatric:        Mood and Affect: Mood normal.        Behavior: Behavior normal.        Thought Content: Thought content normal.        Judgment: Judgment normal.       Assessment & Plan:  1. Routine general medical examination at a health care facility - Congratulated on retirement  - Follow up in one year or sooner if needed - CBC with Differential/Platelet; Future - Hemoglobin A1c; Future - Lipid panel; Future - TSH; Future - CMP with eGFR([facility]); Future  2. Essential hypertension, benign -  Well controlled on current medications  - CBC with Differential/Platelet; Future - Hemoglobin A1c; Future - Lipid panel; Future - TSH; Future - CMP with eGFR([facility]); Future  3. Mixed hyperlipidemia - Consider increase in statin  - CBC with Differential/Platelet; Future - Hemoglobin A1c; Future - Lipid panel; Future - TSH; Future - CMP with eGFR([facility]); Future  4. Hyperparathyroidism, primary ([place])  - TSH; Future - PTH, Intact and Calcium; Future  5. Arthritis - Continue with Mobic PRN   6. BPH  associated with nocturia  - PSA; Future  7. Need for hepatitis C screening test  - Hep C Antibody; Future  8. Need for influenza vaccination  - Flu Vaccine QUAD High Dose(Fluad)  [name], NP

## 2020-08-13 ENCOUNTER — Other Ambulatory Visit: Payer: Self-pay | Admitting: Adult Health

## 2020-08-13 DIAGNOSIS — I10 Essential (primary) hypertension: Secondary | ICD-10-CM

## 2020-08-15 ENCOUNTER — Other Ambulatory Visit: Payer: Self-pay

## 2020-08-15 ENCOUNTER — Other Ambulatory Visit (INDEPENDENT_AMBULATORY_CARE_PROVIDER_SITE_OTHER): Payer: PPO

## 2020-08-15 DIAGNOSIS — R351 Nocturia: Secondary | ICD-10-CM

## 2020-08-15 DIAGNOSIS — N401 Enlarged prostate with lower urinary tract symptoms: Secondary | ICD-10-CM | POA: Diagnosis not present

## 2020-08-15 DIAGNOSIS — I10 Essential (primary) hypertension: Secondary | ICD-10-CM | POA: Diagnosis not present

## 2020-08-15 DIAGNOSIS — Z1159 Encounter for screening for other viral diseases: Secondary | ICD-10-CM

## 2020-08-15 DIAGNOSIS — Z0000 Encounter for general adult medical examination without abnormal findings: Secondary | ICD-10-CM

## 2020-08-15 DIAGNOSIS — E210 Primary hyperparathyroidism: Secondary | ICD-10-CM

## 2020-08-15 DIAGNOSIS — E782 Mixed hyperlipidemia: Secondary | ICD-10-CM

## 2020-08-16 LAB — CBC WITH DIFFERENTIAL/PLATELET
Absolute Monocytes: 583 cells/uL (ref 200–950)
Basophils Absolute: 58 cells/uL (ref 0–200)
Basophils Relative: 0.8 %
Eosinophils Absolute: 338 cells/uL (ref 15–500)
Eosinophils Relative: 4.7 %
HCT: 48.5 % (ref 38.5–50.0)
Hemoglobin: 16.7 g/dL (ref 13.2–17.1)
Lymphs Abs: 1670 cells/uL (ref 850–3900)
MCH: 33.4 pg — ABNORMAL HIGH (ref 27.0–33.0)
MCHC: 34.4 g/dL (ref 32.0–36.0)
MCV: 97 fL (ref 80.0–100.0)
MPV: 11.2 fL (ref 7.5–12.5)
Monocytes Relative: 8.1 %
Neutro Abs: 4550 cells/uL (ref 1500–7800)
Neutrophils Relative %: 63.2 %
Platelets: 185 10*3/uL (ref 140–400)
RBC: 5 10*6/uL (ref 4.20–5.80)
RDW: 12.2 % (ref 11.0–15.0)
Total Lymphocyte: 23.2 %
WBC: 7.2 10*3/uL (ref 3.8–10.8)

## 2020-08-16 LAB — COMPLETE METABOLIC PANEL WITH GFR
AG Ratio: 1.5 (calc) (ref 1.0–2.5)
ALT: 20 U/L (ref 9–46)
AST: 22 U/L (ref 10–35)
Albumin: 4.1 g/dL (ref 3.6–5.1)
Alkaline phosphatase (APISO): 70 U/L (ref 35–144)
BUN/Creatinine Ratio: 18 (calc) (ref 6–22)
BUN: 23 mg/dL (ref 7–25)
CO2: 24 mmol/L (ref 20–32)
Calcium: 9.5 mg/dL (ref 8.6–10.3)
Chloride: 105 mmol/L (ref 98–110)
Creat: 1.31 mg/dL — ABNORMAL HIGH (ref 0.70–1.18)
GFR, Est African American: 60 mL/min/{1.73_m2} (ref >=60)
GFR, Est Non African American: 52 mL/min/{1.73_m2} — ABNORMAL LOW (ref >=60)
Globulin: 2.8 g/dL (calc) (ref 1.9–3.7)
Glucose, Bld: 94 mg/dL (ref 65–99)
Potassium: 4.9 mmol/L (ref 3.5–5.3)
Sodium: 139 mmol/L (ref 135–146)
Total Bilirubin: 0.9 mg/dL (ref 0.2–1.2)
Total Protein: 6.9 g/dL (ref 6.1–8.1)

## 2020-08-16 LAB — PSA: PSA: 2.78 ng/mL (ref <=4.0)

## 2020-08-16 LAB — LIPID PANEL
Cholesterol: 109 mg/dL (ref <200)
HDL: 32 mg/dL — ABNORMAL LOW (ref >=40)
LDL Cholesterol (Calc): 53 mg/dL (calc)
Non-HDL Cholesterol (Calc): 77 mg/dL (calc) (ref <130)
Total CHOL/HDL Ratio: 3.4 (calc) (ref <5.0)
Triglycerides: 162 mg/dL — ABNORMAL HIGH (ref <150)

## 2020-08-16 LAB — HEPATITIS C ANTIBODY
Hepatitis C Ab: NONREACTIVE
SIGNAL TO CUT-OFF: 0.69 (ref <1.00)

## 2020-08-16 LAB — PTH, INTACT AND CALCIUM
Calcium: 9.5 mg/dL (ref 8.6–10.3)
PTH: 58 pg/mL (ref 14–64)

## 2020-08-16 LAB — HEMOGLOBIN A1C
Hgb A1c MFr Bld: 5.6 % of total Hgb (ref <5.7)
Mean Plasma Glucose: 114 (calc)
eAG (mmol/L): 6.3 (calc)

## 2020-08-16 LAB — TSH: TSH: 1.39 mIU/L (ref 0.40–4.50)

## 2020-10-03 DIAGNOSIS — L720 Epidermal cyst: Secondary | ICD-10-CM | POA: Diagnosis not present

## 2020-10-03 DIAGNOSIS — L821 Other seborrheic keratosis: Secondary | ICD-10-CM | POA: Diagnosis not present

## 2020-10-03 DIAGNOSIS — L570 Actinic keratosis: Secondary | ICD-10-CM | POA: Diagnosis not present

## 2020-10-03 DIAGNOSIS — L812 Freckles: Secondary | ICD-10-CM | POA: Diagnosis not present

## 2020-10-26 ENCOUNTER — Other Ambulatory Visit: Payer: Self-pay | Admitting: Adult Health

## 2020-10-26 DIAGNOSIS — E782 Mixed hyperlipidemia: Secondary | ICD-10-CM

## 2020-10-26 DIAGNOSIS — E785 Hyperlipidemia, unspecified: Secondary | ICD-10-CM

## 2020-10-26 MED ORDER — SIMVASTATIN 20 MG PO TABS: 20.0000 mg | ORAL_TABLET | Freq: Every day | ORAL | 2 refills | Status: DC

## 2020-10-26 NOTE — Telephone Encounter (Signed)
I believe so? If he is then we can send in 20 mg dosing so he does not have to split them

## 2020-10-26 NOTE — Telephone Encounter (Signed)
Left a message for a return call.

## 2020-10-26 NOTE — Telephone Encounter (Signed)
Is he taking 1/2 tab?

## 2020-12-30 ENCOUNTER — Ambulatory Visit (INDEPENDENT_AMBULATORY_CARE_PROVIDER_SITE_OTHER): Payer: PPO | Admitting: Pharmacist

## 2020-12-30 DIAGNOSIS — E782 Mixed hyperlipidemia: Secondary | ICD-10-CM | POA: Diagnosis not present

## 2020-12-30 DIAGNOSIS — I10 Essential (primary) hypertension: Secondary | ICD-10-CM

## 2020-12-30 NOTE — Progress Notes (Signed)
Chronic Care Management Pharmacy Note  01/11/2021 Name:  Paul Lowe MRN:  [id number] DOB:  [birth year]  Subjective: Paul Lowe is an 80 y.o. year old male who is a primary patient of [name], NP.  The CCM team was consulted for assistance with disease management and care coordination needs.    Engaged with patient by telephone for follow up visit in response to provider referral for pharmacy case management and/or care coordination services.   Consent to Services:  The patient was given information about Chronic Care Management services, agreed to services, and gave verbal consent prior to initiation of services.  Please see initial visit note for detailed documentation.   Patient Care Team: [name], NP as PCP - General (Family Medicine) Viona Gilmore, [facility] as Pharmacist (Pharmacist)  Recent office visits: 08/12/20 [name], NP: Patient presented for annual exam. Influenza vaccine administered. Unable to access notes.  08/03/20 Ofilia Neas, LPN: Patient presented for AWV.  Recent consult visits: 10/03/20 Jarome Matin (dermatology): Unable to access notes.  06/24/20 Darcey Nora (optometry): Unable to access notes.  Hospital visits: None in previous 6 months  Objective:  Lab Results  Component Value Date   CREATININE 1.31 (H) 08/15/2020   BUN 23 08/15/2020   GFR 54.55 (L) 07/02/2019   GFRNONAA 52 (L) 08/15/2020   GFRAA 60 08/15/2020   NA 139 08/15/2020   K 4.9 08/15/2020   CALCIUM 9.5 08/15/2020   CALCIUM 9.5 08/15/2020   CO2 24 08/15/2020   GLUCOSE 94 08/15/2020    Lab Results  Component Value Date/Time   HGBA1C 5.6 08/15/2020 07:45 AM   GFR 54.55 (L) 07/02/2019 08:26 AM   GFR 67.19 02/18/2018 01:23 PM    Last diabetic Eye exam: No results found for: HMDIABEYEEXA  Last diabetic Foot exam: No results found for: HMDIABFOOTEX   Lab Results  Component Value Date   CHOL 109 08/15/2020   HDL 32 (L) 08/15/2020   LDLCALC 53  08/15/2020   TRIG 162 (H) 08/15/2020   CHOLHDL 3.4 08/15/2020    Hepatic Function Latest Ref Rng & Units 08/15/2020 07/02/2019 02/18/2018  Total Protein 6.1 - 8.1 g/dL 6.9 6.8 7.1  Albumin 3.5 - 5.2 g/dL - 4.2 4.3  AST 10 - 35 U/L '22 25 23  ' ALT 9 - 46 U/L '20 25 22  ' Alk Phosphatase 39 - 117 U/L - 75 68  Total Bilirubin 0.2 - 1.2 mg/dL 0.9 0.8 0.9  Bilirubin, Direct 0.0 - 0.3 mg/dL - - 0.2    Lab Results  Component Value Date/Time   TSH 1.39 08/15/2020 07:45 AM   TSH 1.19 07/02/2019 08:26 AM    CBC Latest Ref Rng & Units 08/15/2020 07/02/2019 02/18/2018  WBC 3.8 - 10.8 Thousand/uL 7.2 7.2 7.6  Hemoglobin 13.2 - 17.1 g/dL 16.7 16.0 17.0  Hematocrit 38.5 - 50.0 % 48.5 45.9 49.1  Platelets 140 - 400 Thousand/uL 185 177.0 189.0    Lab Results  Component Value Date/Time   VD25OH 43.38 07/02/2019 08:26 AM   VD25OH 47.10 05/21/2016 09:21 AM   Lab Results  Component Value Date   PTH 58 08/15/2020   CALCIUM 9.5 08/15/2020   CALCIUM 9.5 08/15/2020   PHOS 2.4 11/12/2014    Clinical ASCVD: No  The ASCVD Risk score (Goff DC Jr., et al., 2013) failed to calculate for the following reasons:   The valid total cholesterol range is 130 to 320 mg/dL    Depression screen [facility] 2/9 08/12/2020 08/03/2020 03/04/2018  Decreased Interest 0 0 0  Down, Depressed, Hopeless 0 0 0  PHQ - 2 Score 0 0 0  Altered sleeping 0 0 -  Tired, decreased energy 0 0 -  Change in appetite 0 0 -  Feeling bad or failure about yourself  0 0 -  Trouble concentrating 0 0 -  Moving slowly or fidgety/restless 0 0 -  Suicidal thoughts 0 0 -  PHQ-9 Score 0 0 -  Difficult doing work/chores Not difficult at all Not difficult at all -     Social History   Tobacco Use  Smoking Status Never Smoker  Smokeless Tobacco Never Used   BP Readings from Last 3 Encounters:  08/12/20 132/80  07/02/19 124/72  03/04/18 108/80   Pulse Readings from Last 3 Encounters:  08/12/20 65  07/02/19 70  03/04/18 84   Wt Readings from  Last 3 Encounters:  08/12/20 192 lb 9.6 oz (87.4 kg)  07/02/19 197 lb 12.8 oz (89.7 kg)  03/04/18 188 lb (85.3 kg)   BMI Readings from Last 3 Encounters:  08/12/20 27.64 kg/m  07/02/19 28.38 kg/m  03/04/18 26.98 kg/m    Assessment/Interventions: Review of patient past medical history, allergies, medications, health status, including review of consultants reports, laboratory and other test data, was performed as part of comprehensive evaluation and provision of chronic care management services.   SDOH:  (Social Determinants of Health) assessments and interventions performed: No   CCM Care Plan  Allergies  Allergen Reactions  . Shrimp Flavor     REACTION: rash    Medications Reviewed Today    Reviewed by Viona Gilmore, [facility] (Pharmacist) on 12/30/20 at [phone]  Med List Status: <None>  Medication Order Taking? Sig Documenting Provider Last Dose Status Informant  Ascorbic Acid (VITAMIN C) 100 MG tablet [id number]  Take 500 mg by mouth daily.  [provider]  Active Self  doxycycline (VIBRA-TABS) 100 MG tablet [id number] Yes Take 100 mg by mouth 2 (two) times daily as needed. FOR ROSACEA [provider] Taking Active   fish oil-omega-3 fatty acids 1000 MG capsule [id number] Yes Take 1 g by mouth daily.  [provider] Taking Active Self  furosemide (LASIX) 20 MG tablet [id number] Yes TAKE 1 TABLET(20 MG) BY MOUTH DAILY Nafziger, Tommi Rumps, NP Taking Active   lisinopril (ZESTRIL) 10 MG tablet [id number] Yes TAKE 1 TABLET BY MOUTH DAILY Nafziger, Tommi Rumps, NP Taking Active   loratadine (CLARITIN) 10 MG tablet [id number] Yes Take 10 mg by mouth daily. [provider] Taking Active Self  meloxicam (MOBIC) 15 MG tablet [id number] Yes TAKE 1 TABLET(15 MG) BY MOUTH DAILY Nafziger, Tommi Rumps, NP Taking Active   Multiple Vitamin tablet [id number] Yes Take 1 a day Philemon Kingdom, MD Taking Active Self  simvastatin (ZOCOR) 20 MG tablet [id number] Yes Take 1 tablet (20 mg total)  by mouth at bedtime. [name], NP Taking Active           Patient Active Problem List   Diagnosis Date Noted  . Routine general medical examination at a health care facility 12/12/2015  . Hyperparathyroidism, primary ([place]) 03/09/2015  . Mucoid cyst of joint 01/27/2015  . Infected sebaceous cyst 01/27/2015  . Hearing loss associated with syndrome of both ears 11/16/2014  . Hypercalcemia 11/12/2014  . BPH associated with nocturia 09/30/2014  . Infected sebaceous cyst of skin 10/28/2012  . Seborrheic keratosis, inflamed 08/12/2012  . Essential hypertension, benign 05/24/2009  . Osteoarthritis 05/20/2008  . Hyperlipidemia 05/12/2007  . ALLERGIC  RHINITIS 04/24/2007  . ACNE ROSACEA 04/24/2007    Immunization History  Administered Date(s) Administered  . Fluad Quad(high Dose 65+) 08/12/2020  . Hepatitis A 06/29/2008  . Influenza Split 07/19/2011, 07/11/2012  . Influenza Whole 07/07/2008, 08/24/2009, 07/21/2010  . Influenza, High Dose Seasonal PF 07/19/2015, 07/03/2018, 06/14/2019  . Influenza,inj,Quad PF,6+ Mos 07/03/2013, 07/09/2014  . Influenza-Unspecified 07/10/2016  . PFIZER(Purple Top)SARS-COV-2 Vaccination 10/30/2019, 11/18/2019, 09/01/2020  . Pneumococcal Conjugate-13 09/29/2013, 09/30/2014  . Pneumococcal Polysaccharide-23 05/24/2009  . Td 10/15/2004  . Tdap 12/19/2015  . Zoster 07/21/2010   Patient reports his acne has gotten worse over the year and he sees Dr Jarome Matin. He prescribed fluoruracil 5% cream twice a day for 14 days and he completed this in Feb with great results. He also has a follow up with him in July. He requested this new diagnosis to be added to his record.  Conditions to be addressed/monitored:  Hypertension, Hyperlipidemia, Osteoarthritis, Allergic Rhinitis and hyperparathyroidism, rosacea  Care Plan : [place]  Updates made by Viona Gilmore, [place] since 01/11/2021 12:00 AM    Problem: Problem: Hypertension, Hyperlipidemia,  Osteoarthritis, Allergic Rhinitis and hyperparathyroidism, rosacea     Long-Range Goal: Patient-Specific Goal   Start Date: 12/30/2020  Expected End Date: 12/30/2021  This Visit's Progress: On track  Priority: High  Note:   Current Barriers:  . Unable to independently monitor therapeutic efficacy  Pharmacist Clinical Goal(s):  Marland Kitchen Patient will achieve adherence to monitoring guidelines and medication adherence to achieve therapeutic efficacy through collaboration with PharmD and provider.    Interventions: . 1:1 collaboration with [name], NP regarding development and update of comprehensive plan of care as evidenced by provider attestation and co-signature . Inter-disciplinary care team collaboration (see longitudinal plan of care) . Comprehensive medication review performed; medication list updated in electronic medical record  Hypertension (BP goal <140/90) -Controlled -Current treatment:  lisinopril 26m, 1 tablet daily   furosemide 243m 1 tablet daily  -Medications previously tried: none  -Current home readings: does not check -Current dietary habits: doesn't eat red meat and mostly follows a pesketarian diet -Current exercise habits: walking 1-1.5 miles almost every day and hikes occasionally -Reports hypotensive/hypertensive symptoms -Educated on Exercise goal of 150 minutes per week; Importance of home blood pressure monitoring; Proper BP monitoring technique; -Counseled to monitor BP at home weekly, document, and provide log at future appointments -Counseled on diet and exercise extensively Recommended to continue current medication  Hyperlipidemia: (LDL goal < 100) -Controlled -Current treatment:  simvastatin 40109m1 tablet at bedtime (patient takes 63m60m.5 tablet at bedtime)   omega-3 fatty acids 1000mg58m00 mg once daily -Medications previously tried: none  -Current dietary patterns: does not eat red meat -Current exercise habits: walking  regularly -Educated on Cholesterol goals;  Importance of limiting foods high in cholesterol; Exercise goal of 150 minutes per week; -Recommended increasing fish oil to 2 capsules to lower triglycerides  Allergic rhinitis (Goal: minimize symptoms of allergies) -Controlled -Current treatment  . loratadine 10mg,53mablet daily -Medications previously tried: none  -Recommended to continue current medication  Hyperparathryoidism (Goal: PTH 14-65) -Controlled -Current treatment  . No medications -Medications previously tried: none  - Plan to continue monitoring PTH, vitamin D and calcium annually  Osteoarthritis (Goal: minimize pain) -Controlled -Current treatment  . meloxicam 15mg, 54mblet daily -Medications previously tried: none  -Counseled on using Voltaren gel or Tylenol for pain as an alternative to meloxicam  Rosacea (Goal: minimize symptoms) -Controlled -Current treatment  .  doxycycline 185m, 1 tablet twice daily as needed (most likely to take in the summer) -Medications previously tried: none  -Recommended to continue current medication   Health Maintenance -Vaccine gaps: shingrix -Current therapy:  . Multivitamin 1 tablet daily . Vitamin C 500 mg 1 tablet daily -Educated on Cost vs benefit of each product must be carefully weighed by individual consumer -Patient is satisfied with current therapy and denies issues -Recommended to continue current medication  Patient Goals/Self-Care Activities . Patient will:  - take medications as prescribed check blood pressure weekly, document, and provide at future appointments target a minimum of 150 minutes of moderate intensity exercise weekly  Follow Up Plan: Telephone follow up appointment with care management team member scheduled for: 1 year       Medication Assistance: None required.  Patient affirms current coverage meets needs.  Patient's preferred pharmacy is:  [facility]Phone: [phone]: [address] [place] [place]Phone: [phone]: [phone] Uses pill box? Yes Pt endorses 100% compliance  We discussed: Current pharmacy is preferred with insurance plan and patient is satisfied with pharmacy services Patient decided to: Continue current medication management strategy  Care Plan and Follow Up Patient Decision:  Patient agrees to Care Plan and Follow-up.  Plan: Telephone follow up appointment with care management team member scheduled for:  1 year  MJeni Salles PharmD, BMountain ViewPharmacist LOccidental Petroleumat BCape [phone]

## 2021-01-11 ENCOUNTER — Encounter: Payer: Self-pay | Admitting: Adult Health

## 2021-01-11 DIAGNOSIS — L570 Actinic keratosis: Secondary | ICD-10-CM | POA: Insufficient documentation

## 2021-01-11 NOTE — Patient Instructions (Addendum)
Paul Lowe,   It was great to get to meet you over the telephone! Below is a summary of some of the topics we discussed. As a reminder, keep working on your diet and increase to 2 of the fish oil capsules daily to lower your triglycerides. Also, don't forget to look into getting the newer shingles shot at your pharmacy.  Please start checking your blood pressure regularly at home as well to make sure your medications are working properly!  Please reach out to me if you have any questions or need anything before our follow up!  Best, Maddie  Jeni Salles, PharmD, [place] [phone]  Visit Information  Goals Addressed   None    Patient Care Plan: CCM Pharmacy Care Plan    Problem Identified: Problem: Hypertension, Hyperlipidemia, Osteoarthritis, Allergic Rhinitis and hyperparathyroidism, rosacea     Long-Range Goal: Patient-Specific Goal   Start Date: 12/30/2020  Expected End Date: 12/30/2021  This Visit's Progress: On track  Priority: High  Note:   Current Barriers:  . Unable to independently monitor therapeutic efficacy  Pharmacist Clinical Goal(s):  Marland Kitchen Patient will achieve adherence to monitoring guidelines and medication adherence to achieve therapeutic efficacy through collaboration with PharmD and provider.    Interventions: . 1:1 collaboration with [name], NP regarding development and update of comprehensive plan of care as evidenced by provider attestation and co-signature . Inter-disciplinary care team collaboration (see longitudinal plan of care) . Comprehensive medication review performed; medication list updated in electronic medical record  Hypertension (BP goal <140/90) -Controlled -Current treatment:  lisinopril 10mg , 1 tablet daily   furosemide 20mg , 1 tablet daily  -Medications previously tried: none  -Current home readings: does not check -Current dietary habits: doesn't eat red meat and  mostly follows a pesketarian diet -Current exercise habits: walking 1-1.5 miles almost every day and hikes occasionally -Reports hypotensive/hypertensive symptoms -Educated on Exercise goal of 150 minutes per week; Importance of home blood pressure monitoring; Proper BP monitoring technique; -Counseled to monitor BP at home weekly, document, and provide log at future appointments -Counseled on diet and exercise extensively Recommended to continue current medication  Hyperlipidemia: (LDL goal < 100) -Controlled -Current treatment:  simvastatin 40mg , 1 tablet at bedtime (patient takes 20mg  (0.5 tablet at bedtime)   omega-3 fatty acids 1000mg , 1000 mg once daily -Medications previously tried: none  -Current dietary patterns: does not eat red meat -Current exercise habits: walking regularly -Educated on Cholesterol goals;  Importance of limiting foods high in cholesterol; Exercise goal of 150 minutes per week; -Recommended increasing fish oil to 2 capsules to lower triglycerides  Allergic rhinitis (Goal: minimize symptoms of allergies) -Controlled -Current treatment  . loratadine 10mg , 1 tablet daily -Medications previously tried: none  -Recommended to continue current medication  Hyperparathryoidism (Goal: PTH 14-65) -Controlled -Current treatment  . No medications -Medications previously tried: none  - Plan to continue monitoring PTH, vitamin D and calcium annually  Osteoarthritis (Goal: minimize pain) -Controlled -Current treatment  . meloxicam 15mg , 1 tablet daily -Medications previously tried: none  -Counseled on using Voltaren gel or Tylenol for pain as an alternative to meloxicam  Rosacea (Goal: minimize symptoms) -Controlled -Current treatment  . doxycycline 100mg , 1 tablet twice daily as needed (most likely to take in the summer) -Medications previously tried: none  -Recommended to continue current medication   Health Maintenance -Vaccine gaps:  shingrix -Current therapy:  . Multivitamin 1 tablet daily . Vitamin C 500 mg 1 tablet daily -Educated  on Cost vs benefit of each product must be carefully weighed by individual consumer -Patient is satisfied with current therapy and denies issues -Recommended to continue current medication  Patient Goals/Self-Care Activities . Patient will:  - take medications as prescribed check blood pressure weekly, document, and provide at future appointments target a minimum of 150 minutes of moderate intensity exercise weekly  Follow Up Plan: Telephone follow up appointment with care management team member scheduled for: 1 year       Patient verbalizes understanding of instructions provided today and agrees to view in [place].  Telephone follow up appointment with pharmacy team member scheduled for:1 year  Viona Gilmore, [facility]  High Triglycerides Eating Plan Triglycerides are a type of fat in the blood. High levels of triglycerides can increase your risk of heart disease and stroke. If your triglyceride levels are high, choosing the right foods can help lower your triglycerides and keep your heart healthy. Work with your health care provider or a diet and nutrition specialist (dietitian) to develop an eating plan that is right for you. What are tips for following this plan? General guidelines  Lose weight, if you are overweight. For most people, losing 5-10 lbs (2-5 kg) helps lower triglyceride levels. A weight-loss plan may include. ? 30 minutes of exercise at least 5 days a week. ? Reducing the amount of calories, sugar, and fat you eat.  Eat a wide variety of fresh fruits, vegetables, and whole grains. These foods are high in fiber.  Eat foods that contain healthy fats, such as fatty fish, nuts, seeds, and olive oil.  Avoid foods that are high in added sugar, added salt (sodium), saturated fat, and trans fat.  Avoid low-fiber, refined carbohydrates such as white bread, crackers,  noodles, and white rice.  Avoid foods with partially hydrogenated oils (trans fats), such as fried foods or stick margarine.  Limit alcohol intake to no more than 1 drink a day for nonpregnant women and 2 drinks a day for men. One drink equals 12 oz of beer, 5 oz of wine, or 1 oz of hard liquor. Your health care provider may recommend that you drink less depending on your overall health.   Reading food labels  Check food labels for the amount of saturated fat. Choose foods with no or very little saturated fat.  Check food labels for the amount of trans fat. Choose foods with no trans fat.  Check food labels for the amount of cholesterol. Choose foods low in cholesterol. Ask your dietitian how much cholesterol you should have each day.  Check food labels for the amount of sodium. Choose foods with less than 140 milligrams (mg) per serving. Shopping  Buy dairy products labeled as nonfat (skim) or low-fat (1%).  Avoid buying processed or prepackaged foods. These are often high in added sugar, sodium, and fat. Cooking  Choose healthy fats when cooking, such as olive oil or canola oil.  Cook foods using lower fat methods, such as baking, broiling, boiling, or grilling.  Make your own sauces, dressings, and marinades when possible, instead of buying them. Store-bought sauces, dressings, and marinades are often high in sodium and sugar. Meal planning  Eat more home-cooked food and less restaurant, buffet, and fast food.  Eat fatty fish at least 2 times each week. Examples of fatty fish include salmon, trout, mackerel, tuna, and herring.  If you eat whole eggs, do not eat more than 3 egg yolks per week. What foods are recommended? The items  listed may not be a complete list. Talk with your dietitian about what dietary choices are best for you. Grains Whole wheat or whole grain breads, crackers, cereals, and pasta. Unsweetened oatmeal. Bulgur. Barley. Quinoa. Brown rice. Whole wheat flour  tortillas. Vegetables Fresh or frozen vegetables. Low-sodium canned vegetables. Fruits All fresh, canned (in natural juice), or frozen fruits. Meats and other protein foods Skinless chicken or [place]. Ground chicken or [place]. Lean cuts of pork, trimmed of fat. Fish and seafood, especially salmon, trout, and herring. Egg whites. Dried beans, peas, or lentils. Unsalted nuts or seeds. Unsalted canned beans. Natural peanut or almond butter. Dairy Low-fat dairy products. Skim or low-fat (1%) milk. Reduced fat (2%) and low-sodium cheese. Low-fat ricotta cheese. Low-fat cottage cheese. Plain, low-fat yogurt. Fats and oils Tub margarine without trans fats. Light or reduced-fat mayonnaise. Light or reduced-fat salad dressings. Avocado. Safflower, olive, sunflower, soybean, and canola oils. What foods are not recommended? The items listed may not be a complete list. Talk with your dietitian about what dietary choices are best for you. Grains White bread. White (regular) pasta. White rice. Cornbread. Bagels. Pastries. Crackers that contain trans fat. Vegetables Creamed or fried vegetables. Vegetables in a cheese sauce. Fruits Sweetened dried fruit. Canned fruit in syrup. Fruit juice. Meats and other protein foods Fatty cuts of meat. Ribs. Chicken wings. Berniece Salines. Sausage. Bologna. Salami. Chitterlings. Fatback. Hot dogs. Bratwurst. Packaged lunch meats. Dairy Whole or reduced-fat (2%) milk. Half-and-half. Cream cheese. Full-fat or sweetened yogurt. Full-fat cheese. Nondairy creamers. Whipped toppings. Processed cheese or cheese spreads. Cheese curds. Beverages Alcohol. Sweetened drinks, such as soda, lemonade, fruit drinks, or punches. Fats and oils Butter. Stick margarine. Lard. Shortening. Ghee. Bacon fat. Tropical oils, such as coconut, palm kernel, or palm oils. Sweets and desserts Corn syrup. Sugars. Honey. Molasses. Candy. Jam and jelly. Syrup. Sweetened cereals. Cookies. Pies. Cakes. Donuts.  Muffins. Ice cream. Condiments Store-bought sauces, dressings, and marinades that are high in sugar, such as ketchup and barbecue sauce. Summary  High levels of triglycerides can increase the risk of heart disease and stroke. Choosing the right foods can help lower your triglycerides.  Eat plenty of fresh fruits, vegetables, and whole grains. Choose low-fat dairy and lean meats. Eat fatty fish at least twice a week.  Avoid processed and prepackaged foods with added sugar, sodium, saturated fat, and trans fat.  If you need suggestions or have questions about what types of food are good for you, talk with your health care provider or a dietitian. This information is not intended to replace advice given to you by your health care provider. Make sure you discuss any questions you have with your health care provider. Document Revised: 02/03/2020 Document Reviewed: 02/03/2020 Elsevier Patient Education  2021 [employer].

## 2021-02-12 ENCOUNTER — Other Ambulatory Visit: Payer: Self-pay | Admitting: Adult Health

## 2021-02-12 DIAGNOSIS — I10 Essential (primary) hypertension: Secondary | ICD-10-CM

## 2021-03-13 ENCOUNTER — Other Ambulatory Visit: Payer: Self-pay | Admitting: Adult Health

## 2021-03-13 DIAGNOSIS — I10 Essential (primary) hypertension: Secondary | ICD-10-CM

## 2021-03-15 ENCOUNTER — Telehealth: Payer: Self-pay | Admitting: Pharmacist

## 2021-03-15 NOTE — Chronic Care Management (AMB) (Signed)
Chronic Care Management Pharmacy Assistant   Name: ARTYOM STENCEL  MRN: [id number] DOB: January 24, 1941  Reason for Encounter: Disease State   Conditions to be addressed/monitored: HTN   Recent office visits:  None  Recent consult visits:  None  Hospital visits:  None in previous 6 months  Medications: Outpatient Encounter Medications as of 03/15/2021  Medication Sig  . Ascorbic Acid (VITAMIN C) 100 MG tablet Take 500 mg by mouth daily.  Marland Kitchen doxycycline (VIBRA-TABS) 100 MG tablet Take 100 mg by mouth 2 (two) times daily as needed. FOR ROSACEA  . fish oil-omega-3 fatty acids 1000 MG capsule Take 1 g by mouth daily.   . furosemide (LASIX) 20 MG tablet TAKE 1 TABLET(20 MG) BY MOUTH DAILY  . lisinopril (ZESTRIL) 10 MG tablet TAKE 1 TABLET BY MOUTH DAILY  . loratadine (CLARITIN) 10 MG tablet Take 10 mg by mouth daily.  . meloxicam (MOBIC) 15 MG tablet TAKE 1 TABLET(15 MG) BY MOUTH DAILY  . Multiple Vitamin tablet Take 1 a day  . simvastatin (ZOCOR) 20 MG tablet Take 1 tablet (20 mg total) by mouth at bedtime.   No facility-administered encounter medications on file as of 03/15/2021.   Reviewed chart prior to disease state call. Spoke with patient regarding BP  Recent Office Vitals: BP Readings from Last 3 Encounters:  08/12/20 132/80  07/02/19 124/72  03/04/18 108/80   Pulse Readings from Last 3 Encounters:  08/12/20 65  07/02/19 70  03/04/18 84    Wt Readings from Last 3 Encounters:  08/12/20 192 lb 9.6 oz (87.4 kg)  07/02/19 197 lb 12.8 oz (89.7 kg)  03/04/18 188 lb (85.3 kg)     Kidney Function Lab Results  Component Value Date/Time   CREATININE 1.31 (H) 08/15/2020 07:45 AM   CREATININE 1.28 07/02/2019 08:26 AM   CREATININE 1.13 02/18/2018 01:23 PM   GFR 54.55 (L) 07/02/2019 08:26 AM   GFRNONAA 52 (L) 08/15/2020 07:45 AM   GFRAA 60 08/15/2020 07:45 AM    BMP Latest Ref Rng & Units 08/15/2020 08/15/2020 07/02/2019  Glucose 65 - 99 mg/dL 94 - -  BUN 7 - 25  mg/dL 23 - -  Creatinine 0.70 - 1.18 mg/dL 1.31(H) - -  BUN/Creat Ratio 6 - 22 (calc) 18 - -  Sodium 135 - 146 mmol/L 139 - -  Potassium 3.5 - 5.3 mmol/L 4.9 - -  Chloride 98 - 110 mmol/L 105 - -  CO2 20 - 32 mmol/L 24 - -  Calcium 8.6 - 10.3 mg/dL 9.5 9.5 9.5   . Current antihypertensive regimen:  o Lisinopril 10 mg one table daily o Furosemide 20 mg 1 tablet daily . How often are you checking your Blood Pressure? 1-2x per week . Current home BP readings:  o 05.25 130/80 o 05.27 130/78 o 05.31 128/78 . What recent interventions/DTPs have been made by any provider to improve Blood Pressure control since last CPP Visit: None . Any recent hospitalizations or ED visits since last visit with CPP? No . What diet changes have been made to improve Blood Pressure Control?  o No Change . What exercise is being done to improve your Blood Pressure Control?  o No Change  Adherence Review: Is the patient currently on ACE/ARB medication? Yes Does the patient have >5 day gap between last estimated fill dates? No  I spoke with the patient about medication adherence. He states that he has been doing well. He continues to take his medication as  prescribed. He likes to check his blood pressure one to two times a week. He's not experiencing any side effects from his current medications. There have been no hospital visits or urgent care visits since his last CPP or PCP visit. He continues on his same diet and exercise routine. He is not having any issues with his current pharmacy. His next follow up CCM appointment is scheduled for March 2023  Star Rating Drugs: Medication Dispensed  Quantity Pharmacy  Simvastatin 20 mg 04.08.2022 90 [facility]  Lisinopril 10 mg 05.16.2022 [place] [facility] Mare Ferrari, [place] Pharmacist Assistant [phone]

## 2021-04-05 DIAGNOSIS — L723 Sebaceous cyst: Secondary | ICD-10-CM | POA: Diagnosis not present

## 2021-04-05 DIAGNOSIS — L821 Other seborrheic keratosis: Secondary | ICD-10-CM | POA: Diagnosis not present

## 2021-04-05 DIAGNOSIS — D1801 Hemangioma of skin and subcutaneous tissue: Secondary | ICD-10-CM | POA: Diagnosis not present

## 2021-04-05 DIAGNOSIS — L812 Freckles: Secondary | ICD-10-CM | POA: Diagnosis not present

## 2021-05-26 ENCOUNTER — Other Ambulatory Visit: Payer: Self-pay | Admitting: Adult Health

## 2021-05-26 DIAGNOSIS — I10 Essential (primary) hypertension: Secondary | ICD-10-CM

## 2021-05-30 ENCOUNTER — Other Ambulatory Visit: Payer: Self-pay

## 2021-05-31 ENCOUNTER — Encounter: Payer: Self-pay | Admitting: Adult Health

## 2021-05-31 ENCOUNTER — Ambulatory Visit (INDEPENDENT_AMBULATORY_CARE_PROVIDER_SITE_OTHER): Payer: PPO | Admitting: Adult Health

## 2021-05-31 VITALS — BP 112/80 | HR 74 | Temp 98.5°F | Ht 70.0 in | Wt 192.0 lb

## 2021-05-31 DIAGNOSIS — B309 Viral conjunctivitis, unspecified: Secondary | ICD-10-CM

## 2021-05-31 DIAGNOSIS — H6123 Impacted cerumen, bilateral: Secondary | ICD-10-CM

## 2021-05-31 NOTE — Progress Notes (Signed)
Subjective:    Patient ID: Paul Lowe, male    DOB: [birth year], 80 y.o.   MRN: [id number]  HPI 80 year old male who  has a past medical history of Actinic keratosis, Adult acne, Allergic rhinitis, Asthma, Cataracts, bilateral, Hyperlipidemia, Hypertension, Idiopathic parathyroidism ([place]), Osteoarthritis, Seasonal allergies, and Seizure ([place]) (1990's).  He presents to the office today for two separate acute issues.   1) Feeling of bilateral ear fullness. Reports that it feels uncomfortable but not painful. Has not had any drainage or loss of hearing.   2) left eye redness - has been present for about a week. Does report watery drainage, waking up with his eyes matted shut and mild itching. No trauma to the eye. He has been applying a warm compress. Denies fevers, chills, vision loss, or eye pain.   Review of Systems See HPI   Past Medical History:  Diagnosis Date   Actinic keratosis    Adult acne    Allergic rhinitis    Asthma    Cataracts, bilateral    Hyperlipidemia    Hypertension    Idiopathic parathyroidism ([facility])    Osteoarthritis    Seasonal allergies    Seizure ([place]) 1990's   no seizure activity since    Social History   Socioeconomic History   Marital status: Married    Spouse name: Not on file   Number of children: Not on file   Years of education: Not on file   Highest education level: Not on file  Occupational History   Not on file  Tobacco Use   Smoking status: Never   Smokeless tobacco: Never  Substance and Sexual Activity   Alcohol use: Yes    Alcohol/week: 2.0 standard drinks    Types: 2 Cans of beer per week   Drug use: No   Sexual activity: Not on file  Other Topics Concern   Not on file  Social History Narrative   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   Married    No kids       He likes to play tennis    Social Determinants of Radio broadcast assistant Strain: Low Risk    Difficulty of Paying Living Expenses: Not hard at  all  Food Insecurity: No Food Insecurity   Worried About Charity fundraiser in the Last Year: Never true   Arboriculturist in the Last Year: Never true  Transportation Needs: No Transportation Needs   Lack of Transportation (Medical): No   Lack of Transportation (Non-Medical): No  Physical Activity: Inactive   Days of Exercise per Week: 0 days   Minutes of Exercise per Session: 0 min  Stress: No Stress Concern Present   Feeling of Stress : Not at all  Social Connections: Moderately Isolated   Frequency of Communication with Friends and Family: Once a week   Frequency of Social Gatherings with Friends and Family: Once a week   Attends Religious Services: Never   Marine scientist or Organizations: Yes   Attends Music therapist: More than 4 times per year   Marital Status: Married  Human resources officer Violence: Not At Risk   Fear of Current or Ex-Partner: No   Emotionally Abused: No   Physically Abused: No   Sexually Abused: No    Past Surgical History:  Procedure Laterality Date   anal cyst     APPENDECTOMY     CATARACT EXTRACTION, BILATERAL  PARATHYROIDECTOMY N/A 03/10/2015   Procedure: PARATHYROIDECTOMY;  Surgeon: Armandina Gemma, MD;  Location: [facility];  Service: General;  Laterality: N/A;   REPLACEMENT TOTAL KNEE Bilateral     Family History  Problem Relation Age of Onset   Stroke Mother    Colon cancer Neg Hx    Esophageal cancer Neg Hx    Rectal cancer Neg Hx    Stomach cancer Neg Hx     Allergies  Allergen Reactions   Shrimp Flavor     REACTION: rash    Current Outpatient Medications on File Prior to Visit  Medication Sig Dispense Refill   Ascorbic Acid (VITAMIN C) 100 MG tablet Take 500 mg by mouth daily.     fish oil-omega-3 fatty acids 1000 MG capsule Take 1 g by mouth daily.      furosemide (LASIX) 20 MG tablet TAKE 1 TABLET(20 MG) BY MOUTH DAILY 270 tablet 0   lisinopril (ZESTRIL) 10 MG tablet TAKE 1 TABLET BY MOUTH DAILY 90 tablet 0    loratadine (CLARITIN) 10 MG tablet Take 10 mg by mouth daily.     meloxicam (MOBIC) 15 MG tablet TAKE 1 TABLET(15 MG) BY MOUTH DAILY 90 tablet 3   Multiple Vitamin tablet Take 1 a day 30 tablet 0   simvastatin (ZOCOR) 20 MG tablet Take 1 tablet (20 mg total) by mouth at bedtime. 90 tablet 2   No current facility-administered medications on file prior to visit.    BP 112/80   Pulse 74   Temp 98.5 F (36.9 C) (Oral)   Ht '5\' 10"'$  (1.778 m)   Wt 192 lb (87.1 kg)   SpO2 95%   BMI 27.55 kg/m       Objective:   Physical Exam Vitals and nursing note reviewed.  Constitutional:      Appearance: Normal appearance.  HENT:     Right Ear: Ear canal normal. There is impacted cerumen.     Left Ear: There is impacted cerumen.  Eyes:     General: Lids are normal.        Left eye: Discharge (watery) present.    Extraocular Movements: Extraocular movements intact.     Conjunctiva/sclera:     Left eye: Left conjunctiva is injected.     Pupils: Pupils are equal, round, and reactive to light.  Neurological:     Mental Status: He is alert.      Assessment & Plan:  1. Bilateral impacted cerumen Verbal Consent obtained. Warm water was applied and gentle ear lavage performed to bilateral ears. There were no complications and following the disimpaction the tympanic membrane were visible bilaterally. Tympanic membranes are intact following the procedure.  Auditory canals are normal.  The patient reported relief of symptoms after removal of cerumen. Patient tolerated procedure well.    2. Viral conjunctivitis of left eye - Continue with   [name], NP

## 2021-05-31 NOTE — Patient Instructions (Signed)
It was great seeing you today   It looks like you have viral conjunctivitis or pink eye. The best treatment is lubricating eye drops and warm/cold compresses. This should resolve in about a weeks time

## 2021-06-21 ENCOUNTER — Other Ambulatory Visit: Payer: Self-pay

## 2021-06-21 ENCOUNTER — Ambulatory Visit (INDEPENDENT_AMBULATORY_CARE_PROVIDER_SITE_OTHER): Payer: PPO

## 2021-06-21 VITALS — BP 122/68 | HR 67 | Temp 98.2°F | Ht 70.0 in | Wt 191.0 lb

## 2021-06-21 DIAGNOSIS — Z0000 Encounter for general adult medical examination without abnormal findings: Secondary | ICD-10-CM | POA: Diagnosis not present

## 2021-06-21 NOTE — Progress Notes (Signed)
Subjective:   Paul Lowe is a 80 y.o. male who presents for an Subsequent Medicare Annual Wellness Visit.  Review of Systems    N/a       Objective:    There were no vitals filed for this visit. There is no height or weight on file to calculate BMI.  Advanced Directives 08/03/2020 03/04/2018 05/23/2017 02/28/2015  Does Patient Have a Medical Advance Directive? Yes Yes Yes Yes  Type of Paramedic of [place];Living will - [place];Living will Living will  Does patient want to make changes to medical advance directive? No - Patient declined - - -  Copy of [place] in Chart? No - copy requested - - No - copy requested    Current Medications (verified) Outpatient Encounter Medications as of 06/21/2021  Medication Sig   Ascorbic Acid (VITAMIN C) 100 MG tablet Take 500 mg by mouth daily.   fish oil-omega-3 fatty acids 1000 MG capsule Take 1 g by mouth daily.    furosemide (LASIX) 20 MG tablet TAKE 1 TABLET(20 MG) BY MOUTH DAILY   lisinopril (ZESTRIL) 10 MG tablet TAKE 1 TABLET BY MOUTH DAILY   loratadine (CLARITIN) 10 MG tablet Take 10 mg by mouth daily.   meloxicam (MOBIC) 15 MG tablet TAKE 1 TABLET(15 MG) BY MOUTH DAILY   Multiple Vitamin tablet Take 1 a day   simvastatin (ZOCOR) 20 MG tablet Take 1 tablet (20 mg total) by mouth at bedtime.   No facility-administered encounter medications on file as of 06/21/2021.    Allergies (verified) Shrimp flavor   History: Past Medical History:  Diagnosis Date   Actinic keratosis    Adult acne    Allergic rhinitis    Asthma    Cataracts, bilateral    Hyperlipidemia    Hypertension    Idiopathic parathyroidism (HCC)    Osteoarthritis    Seasonal allergies    Seizure ([place]) 1990's   no seizure activity since   Past Surgical History:  Procedure Laterality Date   anal cyst     APPENDECTOMY     CATARACT EXTRACTION, BILATERAL     PARATHYROIDECTOMY N/A 03/10/2015    Procedure: PARATHYROIDECTOMY;  Surgeon: Armandina Gemma, MD;  Location: [facility];  Service: General;  Laterality: N/A;   REPLACEMENT TOTAL KNEE Bilateral    Family History  Problem Relation Age of Onset   Stroke Mother    Colon cancer Neg Hx    Esophageal cancer Neg Hx    Rectal cancer Neg Hx    Stomach cancer Neg Hx    Social History   Socioeconomic History   Marital status: Married    Spouse name: Not on file   Number of children: Not on file   Years of education: Not on file   Highest education level: Not on file  Occupational History   Not on file  Tobacco Use   Smoking status: Never   Smokeless tobacco: Never  Substance and Sexual Activity   Alcohol use: Yes    Alcohol/week: 2.0 standard drinks    Types: 2 Cans of beer per week   Drug use: No   Sexual activity: Not on file  Other Topics Concern   Not on file  Social History Narrative   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   Married    No kids       He likes to play tennis    Social Determinants of Health  Financial Resource Strain: Low Risk    Difficulty of Paying Living Expenses: Not hard at all  Food Insecurity: No Food Insecurity   Worried About Charity fundraiser in the Last Year: Never true   Ran Out of Food in the Last Year: Never true  Transportation Needs: No Transportation Needs   Lack of Transportation (Medical): No   Lack of Transportation (Non-Medical): No  Physical Activity: Inactive   Days of Exercise per Week: 0 days   Minutes of Exercise per Session: 0 min  Stress: No Stress Concern Present   Feeling of Stress : Not at all  Social Connections: Moderately Isolated   Frequency of Communication with Friends and Family: Once a week   Frequency of Social Gatherings with Friends and Family: Once a week   Attends Religious Services: Never   Marine scientist or Organizations: Yes   Attends Music therapist: More than 4 times per year   Marital Status: Married    Tobacco  Counseling Counseling given: Not Answered   Clinical Intake:                 Diabetic?no         Activities of Daily Living In your present state of health, do you have any difficulty performing the following activities: 08/03/2020  Hearing? N  Vision? N  Difficulty concentrating or making decisions? Y  Walking or climbing stairs? Y  Comment Has arthritis has some pain  Dressing or bathing? N  Doing errands, shopping? N  Preparing Food and eating ? N  Using the Toilet? N  In the past six months, have you accidently leaked urine? N  Do you have problems with loss of bowel control? N  Managing your Medications? N  Managing your Finances? N  Housekeeping or managing your Housekeeping? N  Some recent data might be hidden    Patient Care Team: [name], NP as PCP - General (Family Medicine) Viona Gilmore, [facility] as Pharmacist (Pharmacist)  Indicate any recent Medical Services you may have received from other than [facility] providers in the past year (date may be approximate).     Assessment:   This is a routine wellness examination for Victoria.  Hearing/Vision screen No results found.  Dietary issues and exercise activities discussed:     Goals Addressed   None    Depression Screen PHQ 2/9 Scores 08/12/2020 08/03/2020 03/04/2018 02/18/2018 11/20/2016 12/12/2015 09/30/2014  PHQ - 2 Score 0 0 0 0 0 0 0  PHQ- 9 Score 0 0 - - - - -    Fall Risk Fall Risk  08/03/2020 05/10/2020 09/07/2019 02/18/2018 11/20/2016  Falls in the past year? 0 0 0 No No  Comment - [facility] Telephone Survey: data to providers prior to load [employer] Telephone Survey: data to providers prior to load - -  Number falls in past yr: 0 - - - -  Injury with Fall? 0 - - - -  Risk for fall due to : No Fall Risks - - - -  Follow up Falls evaluation completed;Falls prevention discussed - - - -    FALL RISK PREVENTION PERTAINING TO THE HOME:  Any stairs in or around the home? Yes  If so, are there any  without handrails? No  Home free of loose throw rugs in walkways, pet beds, electrical cords, etc? Yes  Adequate lighting in your home to reduce risk of falls? Yes   ASSISTIVE DEVICES UTILIZED TO PREVENT FALLS:  Life alert? No  Use of a cane, walker or w/c? No  Grab bars in the bathroom? No  Shower chair or bench in shower? No  Elevated toilet seat or a handicapped toilet? Yes   TIMED UP AND GO:  Was the test performed? Yes .  Length of time to ambulate 10 feet: 7 sec.   Gait steady and fast without use of assistive device  Cognitive Function:  Normal cognitive status assessed by direct observation by this Nurse Health Advisor. No abnormalities found.   MMSE - Mini Mental State Exam 03/04/2018 03/04/2018  Not completed: (No Data) (No Data)     6CIT Screen 08/03/2020  What Year? 0 points  What month? 0 points  What time? 0 points  Count back from 20 0 points  Months in reverse 0 points  Repeat phrase 0 points  Total Score 0    Immunizations Immunization History  Administered Date(s) Administered   Fluad Quad(high Dose 65+) 08/12/2020   Hepatitis A 06/29/2008   Influenza Split 07/19/2011, 07/11/2012   Influenza Whole 07/07/2008, 08/24/2009, 07/21/2010   Influenza, High Dose Seasonal PF 07/19/2015, 07/03/2018, 06/14/2019   Influenza,inj,Quad PF,6+ Mos 07/03/2013, 07/09/2014   Influenza-Unspecified 07/10/2016   PFIZER(Purple Top)SARS-COV-2 Vaccination 10/30/2019, 11/18/2019, 09/01/2020   Pneumococcal Conjugate-13 09/29/2013, 09/30/2014   Pneumococcal Polysaccharide-23 05/24/2009   Td 10/15/2004   Tdap 12/19/2015   Zoster, Live 07/21/2010    TDAP status: Up to date  Flu Vaccine status: Up to date  Pneumococcal vaccine status: Up to date  Covid-19 vaccine status: Completed vaccines  Qualifies for Shingles Vaccine? Yes   Zostavax completed No   Shingrix Completed?: No.    Education has been provided regarding the importance of this vaccine. Patient has been  advised to call insurance company to determine out of pocket expense if they have not yet received this vaccine. Advised may also receive vaccine at local pharmacy or Health Dept. Verbalized acceptance and understanding.  Screening Tests Health Maintenance  Topic Date Due   Zoster Vaccines- Shingrix (1 of 2) Never done   COVID-19 Vaccine (4 - Booster for Pfizer series) 12/02/2020   INFLUENZA VACCINE  05/15/2021   TETANUS/TDAP  12/18/2025   Hepatitis C Screening  Completed   PNA vac Low Risk Adult  Completed   HPV VACCINES  Aged Out    Health Maintenance  Health Maintenance Due  Topic Date Due   Zoster Vaccines- Shingrix (1 of 2) Never done   COVID-19 Vaccine (4 - Booster for Pfizer series) 12/02/2020   INFLUENZA VACCINE  05/15/2021    Colorectal cancer screening: No longer required.   Lung Cancer Screening: (Low Dose CT Chest recommended if Age 31-80 years, 30 pack-year currently smoking OR have quit w/in 15years.) does not qualify.   Lung Cancer Screening Referral: n/a  Additional Screening:  Hepatitis C Screening: does not qualify; Completed 08/15/2020  Vision Screening: Recommended annual ophthalmology exams for early detection of glaucoma and other disorders of the eye. Is the patient up to date with their annual eye exam?  Yes  Who is the provider or what is the name of the office in which the patient attends annual eye exams? [facility] If pt is not established with a provider, would they like to be referred to a provider to establish care? No .   Dental Screening: Recommended annual dental exams for proper oral hygiene  Community Resource Referral / Chronic Care Management: CRR required this visit?  No   CCM required this visit?  No      Plan:     I have personally reviewed and noted the following in the patient's chart:   Medical and social history Use of alcohol, tobacco or illicit drugs  Current medications and supplements including opioid  prescriptions. Patient is not currently taking opioid prescriptions. Functional ability and status Nutritional status Physical activity Advanced directives List of other physicians Hospitalizations, surgeries, and ER visits in previous 12 months Vitals Screenings to include cognitive, depression, and falls Referrals and appointments  In addition, I have reviewed and discussed with patient certain preventive protocols, quality metrics, and best practice recommendations. A written personalized care plan for preventive services as well as general preventive health recommendations were provided to patient.     Randel Pigg, LPN   [id number]   Nurse Notes: none

## 2021-06-21 NOTE — Patient Instructions (Signed)
[name] , Thank you for taking time to come for your Medicare Wellness Visit. I appreciate your ongoing commitment to your health goals. Please review the following plan we discussed and let me know if I can assist you in the future.   Screening recommendations/referrals: Colonoscopy: no longer required  Recommended yearly ophthalmology/optometry visit for glaucoma screening and checkup Recommended yearly dental visit for hygiene and checkup  Vaccinations: Influenza vaccine: due in fall 2022  Pneumococcal vaccine: completed series  Tdap vaccine: 12/19/2015 Shingles vaccine: will obtain local pharmacy     Advanced directives: will provide copies   Conditions/risks identified: none   Next appointment: 08/16/2021  0900am  Paul Lowe   Preventive Care 78 Years and Older, Male Preventive care refers to lifestyle choices and visits with your health care provider that can promote health and wellness. What does preventive care include? A yearly physical exam. This is also called an annual well check. Dental exams once or twice a year. Routine eye exams. Ask your health care provider how often you should have your eyes checked. Personal lifestyle choices, including: Daily care of your teeth and gums. Regular physical activity. Eating a healthy diet. Avoiding tobacco and drug use. Limiting alcohol use. Practicing safe sex. Taking low doses of aspirin every day. Taking vitamin and mineral supplements as recommended by your health care provider. What happens during an annual well check? The services and screenings done by your health care provider during your annual well check will depend on your age, overall health, lifestyle risk factors, and family history of disease. Counseling  Your health care provider may ask you questions about your: Alcohol use. Tobacco use. Drug use. Emotional well-being. Home and relationship well-being. Sexual activity. Eating habits. History of  falls. Memory and ability to understand (cognition). Work and work Statistician. Screening  You may have the following tests or measurements: Height, weight, and BMI. Blood pressure. Lipid and cholesterol levels. These may be checked every 5 years, or more frequently if you are over 70 years old. Skin check. Lung cancer screening. You may have this screening every year starting at age 53 if you have a 30-pack-year history of smoking and currently smoke or have quit within the past 15 years. Fecal occult blood test (FOBT) of the stool. You may have this test every year starting at age 66. Flexible sigmoidoscopy or colonoscopy. You may have a sigmoidoscopy every 5 years or a colonoscopy every 10 years starting at age 33. Prostate cancer screening. Recommendations will vary depending on your family history and other risks. Hepatitis C blood test. Hepatitis B blood test. Sexually transmitted disease (STD) testing. Diabetes screening. This is done by checking your blood sugar (glucose) after you have not eaten for a while (fasting). You may have this done every 1-3 years. Abdominal aortic aneurysm (AAA) screening. You may need this if you are a current or former smoker. Osteoporosis. You may be screened starting at age 2 if you are at high risk. Talk with your health care provider about your test results, treatment options, and if necessary, the need for more tests. Vaccines  Your health care provider may recommend certain vaccines, such as: Influenza vaccine. This is recommended every year. Tetanus, diphtheria, and acellular pertussis (Tdap, Td) vaccine. You may need a Td booster every 10 years. Zoster vaccine. You may need this after age 58. Pneumococcal 13-valent conjugate (PCV13) vaccine. One dose is recommended after age 36. Pneumococcal polysaccharide (PPSV23) vaccine. One dose is recommended after age 37. Talk  to your health care provider about which screenings and vaccines you need and  how often you need them. This information is not intended to replace advice given to you by your health care provider. Make sure you discuss any questions you have with your health care provider. Document Released: 10/28/2015 Document Revised: 06/20/2016 Document Reviewed: 08/02/2015 Elsevier Interactive Patient Education  2017 [employer] Prevention in the Home Falls can cause injuries. They can happen to people of all ages. There are many things you can do to make your home safe and to help prevent falls. What can I do on the outside of my home? Regularly fix the edges of walkways and driveways and fix any cracks. Remove anything that might make you trip as you walk through a door, such as a raised step or threshold. Trim any bushes or trees on the path to your home. Use bright outdoor lighting. Clear any walking paths of anything that might make someone trip, such as rocks or tools. Regularly check to see if handrails are loose or broken. Make sure that both sides of any steps have handrails. Any raised decks and porches should have guardrails on the edges. Have any leaves, snow, or ice cleared regularly. Use sand or salt on walking paths during winter. Clean up any spills in your garage right away. This includes oil or grease spills. What can I do in the bathroom? Use night lights. Install grab bars by the toilet and in the tub and shower. Do not use towel bars as grab bars. Use non-skid mats or decals in the tub or shower. If you need to sit down in the shower, use a plastic, non-slip stool. Keep the floor dry. Clean up any water that spills on the floor as soon as it happens. Remove soap buildup in the tub or shower regularly. Attach bath mats securely with double-sided non-slip rug tape. Do not have throw rugs and other things on the floor that can make you trip. What can I do in the bedroom? Use night lights. Make sure that you have a light by your bed that is easy to  reach. Do not use any sheets or blankets that are too big for your bed. They should not hang down onto the floor. Have a firm chair that has side arms. You can use this for support while you get dressed. Do not have throw rugs and other things on the floor that can make you trip. What can I do in the kitchen? Clean up any spills right away. Avoid walking on wet floors. Keep items that you use a lot in easy-to-reach places. If you need to reach something above you, use a strong step stool that has a grab bar. Keep electrical cords out of the way. Do not use floor polish or wax that makes floors slippery. If you must use wax, use non-skid floor wax. Do not have throw rugs and other things on the floor that can make you trip. What can I do with my stairs? Do not leave any items on the stairs. Make sure that there are handrails on both sides of the stairs and use them. Fix handrails that are broken or loose. Make sure that handrails are as long as the stairways. Check any carpeting to make sure that it is firmly attached to the stairs. Fix any carpet that is loose or worn. Avoid having throw rugs at the top or bottom of the stairs. If you do have throw rugs,  attach them to the floor with carpet tape. Make sure that you have a light switch at the top of the stairs and the bottom of the stairs. If you do not have them, ask someone to add them for you. What else can I do to help prevent falls? Wear shoes that: Do not have high heels. Have rubber bottoms. Are comfortable and fit you well. Are closed at the toe. Do not wear sandals. If you use a stepladder: Make sure that it is fully opened. Do not climb a closed stepladder. Make sure that both sides of the stepladder are locked into place. Ask someone to hold it for you, if possible. Clearly mark and make sure that you can see: Any grab bars or handrails. First and last steps. Where the edge of each step is. Use tools that help you move  around (mobility aids) if they are needed. These include: Canes. Walkers. Scooters. Crutches. Turn on the lights when you go into a dark area. Replace any light bulbs as soon as they burn out. Set up your furniture so you have a clear path. Avoid moving your furniture around. If any of your floors are uneven, fix them. If there are any pets around you, be aware of where they are. Review your medicines with your doctor. Some medicines can make you feel dizzy. This can increase your chance of falling. Ask your doctor what other things that you can do to help prevent falls. This information is not intended to replace advice given to you by your health care provider. Make sure you discuss any questions you have with your health care provider. Document Released: 07/28/2009 Document Revised: 03/08/2016 Document Reviewed: 11/05/2014 Elsevier Interactive Patient Education  2017 [employer].

## 2021-07-12 DIAGNOSIS — L821 Other seborrheic keratosis: Secondary | ICD-10-CM | POA: Diagnosis not present

## 2021-07-12 DIAGNOSIS — L570 Actinic keratosis: Secondary | ICD-10-CM | POA: Diagnosis not present

## 2021-07-12 DIAGNOSIS — D1801 Hemangioma of skin and subcutaneous tissue: Secondary | ICD-10-CM | POA: Diagnosis not present

## 2021-07-12 DIAGNOSIS — L812 Freckles: Secondary | ICD-10-CM | POA: Diagnosis not present

## 2021-07-19 ENCOUNTER — Other Ambulatory Visit: Payer: Self-pay | Admitting: Adult Health

## 2021-07-19 DIAGNOSIS — M1990 Unspecified osteoarthritis, unspecified site: Secondary | ICD-10-CM

## 2021-08-15 ENCOUNTER — Other Ambulatory Visit: Payer: Self-pay

## 2021-08-16 ENCOUNTER — Encounter: Payer: Self-pay | Admitting: Adult Health

## 2021-08-16 ENCOUNTER — Ambulatory Visit (INDEPENDENT_AMBULATORY_CARE_PROVIDER_SITE_OTHER): Payer: PPO | Admitting: Adult Health

## 2021-08-16 VITALS — BP 120/80 | HR 66 | Temp 98.6°F | Ht 70.0 in | Wt 194.0 lb

## 2021-08-16 DIAGNOSIS — M1990 Unspecified osteoarthritis, unspecified site: Secondary | ICD-10-CM

## 2021-08-16 DIAGNOSIS — R351 Nocturia: Secondary | ICD-10-CM | POA: Diagnosis not present

## 2021-08-16 DIAGNOSIS — N401 Enlarged prostate with lower urinary tract symptoms: Secondary | ICD-10-CM

## 2021-08-16 DIAGNOSIS — Z0000 Encounter for general adult medical examination without abnormal findings: Secondary | ICD-10-CM | POA: Diagnosis not present

## 2021-08-16 DIAGNOSIS — E782 Mixed hyperlipidemia: Secondary | ICD-10-CM | POA: Diagnosis not present

## 2021-08-16 DIAGNOSIS — E210 Primary hyperparathyroidism: Secondary | ICD-10-CM | POA: Diagnosis not present

## 2021-08-16 DIAGNOSIS — I10 Essential (primary) hypertension: Secondary | ICD-10-CM

## 2021-08-16 LAB — LIPID PANEL
Cholesterol: 122 mg/dL (ref 0–200)
HDL: 38.2 mg/dL — ABNORMAL LOW (ref >39.00)
LDL Cholesterol: 58 mg/dL (ref 0–99)
NonHDL: 83.31
Total CHOL/HDL Ratio: 3
Triglycerides: 126 mg/dL (ref 0.0–149.0)
VLDL: 25.2 mg/dL (ref 0.0–40.0)

## 2021-08-16 LAB — CBC WITH DIFFERENTIAL/PLATELET
Basophils Absolute: 0.1 10*3/uL (ref 0.0–0.1)
Basophils Relative: 0.8 % (ref 0.0–3.0)
Eosinophils Absolute: 0.3 10*3/uL (ref 0.0–0.7)
Eosinophils Relative: 3.8 % (ref 0.0–5.0)
HCT: 47.3 % (ref 39.0–52.0)
Hemoglobin: 16.1 g/dL (ref 13.0–17.0)
Lymphocytes Relative: 20.2 % (ref 12.0–46.0)
Lymphs Abs: 1.5 10*3/uL (ref 0.7–4.0)
MCHC: 34 g/dL (ref 30.0–36.0)
MCV: 96.4 fl (ref 78.0–100.0)
Monocytes Absolute: 0.5 10*3/uL (ref 0.1–1.0)
Monocytes Relative: 7.2 % (ref 3.0–12.0)
Neutro Abs: 5 10*3/uL (ref 1.4–7.7)
Neutrophils Relative %: 68 % (ref 43.0–77.0)
Platelets: 177 10*3/uL (ref 150.0–400.0)
RBC: 4.91 Mil/uL (ref 4.22–5.81)
RDW: 12.5 % (ref 11.5–15.5)
WBC: 7.4 10*3/uL (ref 4.0–10.5)

## 2021-08-16 LAB — COMPREHENSIVE METABOLIC PANEL
ALT: 24 U/L (ref 0–53)
AST: 25 U/L (ref 0–37)
Albumin: 4.4 g/dL (ref 3.5–5.2)
Alkaline Phosphatase: 69 U/L (ref 39–117)
BUN: 19 mg/dL (ref 6–23)
CO2: 29 mEq/L (ref 19–32)
Calcium: 9.6 mg/dL (ref 8.4–10.5)
Chloride: 103 mEq/L (ref 96–112)
Creatinine, Ser: 1.33 mg/dL (ref 0.40–1.50)
GFR: 51.1 mL/min — ABNORMAL LOW (ref >60.00)
Glucose, Bld: 86 mg/dL (ref 70–99)
Potassium: 4.9 mEq/L (ref 3.5–5.1)
Sodium: 140 mEq/L (ref 135–145)
Total Bilirubin: 0.9 mg/dL (ref 0.2–1.2)
Total Protein: 7 g/dL (ref 6.0–8.3)

## 2021-08-16 LAB — TSH: TSH: 1.12 u[IU]/mL (ref 0.35–5.50)

## 2021-08-16 LAB — PSA: PSA: 2.86 ng/mL (ref 0.10–4.00)

## 2021-08-16 MED ORDER — LISINOPRIL 10 MG PO TABS
10.0000 mg | ORAL_TABLET | Freq: Every day | ORAL | 3 refills | Status: DC
Start: 2021-08-16 — End: 2022-08-21

## 2021-08-16 NOTE — Patient Instructions (Signed)
It was great seeing you today   We will follow up with you regarding your lab work   I will see you back in one year or sooner if needed

## 2021-08-16 NOTE — Progress Notes (Signed)
Subjective:    Patient ID: Paul Lowe, male    DOB: 05/16/1941, 80 y.o.   MRN: [id number]  HPI  Patient presents for yearly preventative medicine examination. He is a pleasant 80 year old male who  has a past medical history of Actinic keratosis, Adult acne, Allergic rhinitis, Asthma, Cataracts, bilateral, Hyperlipidemia, Hypertension, Idiopathic parathyroidism ([place]), Osteoarthritis, Seasonal allergies, and Seizure ([place]) (1990's).  Hyperlipidemia-currently controlled with Zocor 20 mg daily.  He denies myalgia or fatigue Lab Results  Component Value Date   CHOL 109 08/15/2020   HDL 32 (L) 08/15/2020   LDLCALC 53 08/15/2020   TRIG 162 (H) 08/15/2020   CHOLHDL 3.4 08/15/2020   Hypertension-takes lisinopril 10 mg daily and Lasix 20 mg daily.  He denies dizziness, lightheadedness, chest pain, or shortness of breath BP Readings from Last 3 Encounters:  08/16/21 120/80  06/21/21 122/68  05/31/21 112/80   Rosacea-takes doxycycline 100 mg twice daily PRN.   Osteoarthritis-takes Mobic 15 mg daily as needed.  He does feel as though his pain is adequately controlled  Hyperparathyroidism-had parathyroidectomy in May 2016.  Not currently on any medications.  Feels as though he is doing well Lab Results  Component Value Date   TSH 1.39 08/15/2020   BPH - Asymptomatic   All immunizations and health maintenance protocols were reviewed with the patient and needed orders were placed. Up to date on vaccinations except for shingles. He is unsure if he had chicken pox as a child.   Appropriate screening laboratory values were ordered for the patient including screening of hyperlipidemia, renal function and hepatic function. If indicated by BPH, a PSA was ordered.  Medication reconciliation,  past medical history, social history, problem list and allergies were reviewed in detail with the patient  Goals were established with regard to weight loss, exercise, and  diet in compliance with  medications. He walks with his wife and tries to stay active. Eats a heart healthy diet.   Wt Readings from Last 3 Encounters:  08/16/21 194 lb (88 kg)  06/21/21 191 lb (86.6 kg)  05/31/21 192 lb (87.1 kg)   Review of Systems  Constitutional: Negative.   HENT: Negative.    Eyes: Negative.   Respiratory: Negative.    Cardiovascular: Negative.   Gastrointestinal: Negative.   Endocrine: Negative.   Genitourinary: Negative.   Musculoskeletal: Negative.   Skin: Negative.   Allergic/Immunologic: Negative.   Neurological: Negative.   Hematological: Negative.   Psychiatric/Behavioral: Negative.    All other systems reviewed and are negative. Past Medical History:  Diagnosis Date   Actinic keratosis    Adult acne    Allergic rhinitis    Asthma    Cataracts, bilateral    Hyperlipidemia    Hypertension    Idiopathic parathyroidism (H[facility])    Osteoarthritis    Seasonal allergies    Seizure ([place]) 1990's   no seizure activity since    Social History   Socioeconomic History   Marital status: Married    Spouse name: Not on file   Number of children: Not on file   Years of education: Not on file   Highest education level: Not on file  Occupational History   Not on file  Tobacco Use   Smoking status: Never   Smokeless tobacco: Never  Substance and Sexual Activity   Alcohol use: Yes    Alcohol/week: 2.0 standard drinks    Types: 2 Cans of beer per week   Drug use: No  Sexual activity: Not on file  Other Topics Concern   Not on file  Social History Narrative   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   Married    No kids       He likes to play tennis    Social Determinants of Radio broadcast assistant Strain: Low Risk    Difficulty of Paying Living Expenses: Not hard at all  Food Insecurity: Not on file  Transportation Needs: No Transportation Needs   Lack of Transportation (Medical): No   Lack of Transportation (Non-Medical): No  Physical Activity:  Insufficiently Active   Days of Exercise per Week: 2 days   Minutes of Exercise per Session: 60 min  Stress: No Stress Concern Present   Feeling of Stress : Not at all  Social Connections: Moderately Integrated   Frequency of Communication with Friends and Family: Three times a week   Frequency of Social Gatherings with Friends and Family: Three times a week   Attends Religious Services: Never   Active Member of Clubs or Organizations: Yes   Attends Music therapist: More than 4 times per year   Marital Status: Married  Human resources officer Violence: Not At Risk   Fear of Current or Ex-Partner: No   Emotionally Abused: No   Physically Abused: No   Sexually Abused: No    Past Surgical History:  Procedure Laterality Date   anal cyst     APPENDECTOMY     CATARACT EXTRACTION, BILATERAL     PARATHYROIDECTOMY N/A 03/10/2015   Procedure: PARATHYROIDECTOMY;  Surgeon: Armandina Gemma, MD;  Location: [facility];  Service: General;  Laterality: N/A;   REPLACEMENT TOTAL KNEE Bilateral     Family History  Problem Relation Age of Onset   Stroke Mother    Colon cancer Neg Hx    Esophageal cancer Neg Hx    Rectal cancer Neg Hx    Stomach cancer Neg Hx     Allergies  Allergen Reactions   Shrimp Flavor     REACTION: rash    Current Outpatient Medications on File Prior to Visit  Medication Sig Dispense Refill   Ascorbic Acid (VITAMIN C) 100 MG tablet Take 500 mg by mouth daily.     fish oil-omega-3 fatty acids 1000 MG capsule Take 1 g by mouth daily.      furosemide (LASIX) 20 MG tablet TAKE 1 TABLET(20 MG) BY MOUTH DAILY 270 tablet 0   lisinopril (ZESTRIL) 10 MG tablet TAKE 1 TABLET BY MOUTH DAILY 90 tablet 0   loratadine (CLARITIN) 10 MG tablet Take 10 mg by mouth daily.     meloxicam (MOBIC) 15 MG tablet TAKE 1 TABLET(15 MG) BY MOUTH DAILY 90 tablet 0   Multiple Vitamin tablet Take 1 a day 30 tablet 0   simvastatin (ZOCOR) 20 MG tablet TAKE 1 TABLET(20 MG) BY MOUTH AT BEDTIME 90  tablet 0   No current facility-administered medications on file prior to visit.    BP 120/80   Pulse 66   Temp 98.6 F (37 C) (Oral)   Ht 5\' 10"  (1.778 m)   Wt 194 lb (88 kg)   SpO2 95%   BMI 27.84 kg/m       Objective:   Physical Exam Vitals and nursing note reviewed.  Constitutional:      General: He is not in acute distress.    Appearance: Normal appearance. He is well-developed and normal weight.  HENT:  Head: Normocephalic and atraumatic.     Right Ear: Tympanic membrane, ear canal and external ear normal. There is no impacted cerumen.     Left Ear: Tympanic membrane, ear canal and external ear normal. There is no impacted cerumen.     Nose: Nose normal. No congestion or rhinorrhea.     Mouth/Throat:     Mouth: Mucous membranes are moist.     Pharynx: Oropharynx is clear. No oropharyngeal exudate or posterior oropharyngeal erythema.  Eyes:     General:        Right eye: No discharge.        Left eye: No discharge.     Extraocular Movements: Extraocular movements intact.     Conjunctiva/sclera: Conjunctivae normal.     Pupils: Pupils are equal, round, and reactive to light.  Neck:     Vascular: No carotid bruit.     Trachea: No tracheal deviation.  Cardiovascular:     Rate and Rhythm: Normal rate and regular rhythm.     Pulses: Normal pulses.     Heart sounds: Normal heart sounds. No murmur heard.   No friction rub. No gallop.  Pulmonary:     Effort: Pulmonary effort is normal. No respiratory distress.     Breath sounds: Normal breath sounds. No stridor. No wheezing, rhonchi or rales.  Chest:     Chest wall: No tenderness.  Abdominal:     General: Bowel sounds are normal. There is no distension.     Palpations: Abdomen is soft. There is no mass.     Tenderness: There is no abdominal tenderness. There is no right CVA tenderness, left CVA tenderness, guarding or rebound.     Hernia: No hernia is present.  Musculoskeletal:        General: No swelling,  tenderness, deformity or signs of injury. Normal range of motion.     Right lower leg: No edema.     Left lower leg: No edema.  Lymphadenopathy:     Cervical: No cervical adenopathy.  Skin:    General: Skin is warm and dry.     Capillary Refill: Capillary refill takes less than 2 seconds.     Coloration: Skin is not jaundiced or pale.     Findings: No bruising, erythema, lesion or rash.  Neurological:     General: No focal deficit present.     Mental Status: He is alert and oriented to person, place, and time.     Cranial Nerves: No cranial nerve deficit.     Sensory: No sensory deficit.     Motor: No weakness.     Coordination: Coordination normal.     Gait: Gait normal.     Deep Tendon Reflexes: Reflexes normal.  Psychiatric:        Mood and Affect: Mood normal.        Behavior: Behavior normal.        Thought Content: Thought content normal.        Judgment: Judgment normal.      Assessment & Plan:  1. Routine general medical examination at a health care facility - Encouraged weight loss through diet and exercise - Follow up in one year or sooner if needed - Varicella Zoster Antibody, IgG; Future - CBC with Differential/Platelet; Future - Comprehensive metabolic panel; Future - Lipid panel; Future - TSH; Future - PTH, Intact and Calcium; Future  2. Mixed hyperlipidemia - Consider increase in statin  - CBC with Differential/Platelet; Future - Comprehensive metabolic panel; Future -  Lipid panel; Future - TSH; Future - PTH, Intact and Calcium; Future  3. Essential hypertension, benign - Controlled. No change in medications  - CBC with Differential/Platelet; Future - Comprehensive metabolic panel; Future - Lipid panel; Future - TSH; Future - PTH, Intact and Calcium; Future - lisinopril (ZESTRIL) 10 MG tablet; Take 1 tablet (10 mg total) by mouth daily.  Dispense: 90 tablet; Refill: 3  4. Hyperparathyroidism, primary ([place])  - CBC with Differential/Platelet;  Future - Comprehensive metabolic panel; Future - Lipid panel; Future - TSH; Future - PTH, Intact and Calcium; Future  5. BPH associated with nocturia  - PSA; Future  6. Arthritis - Continue with mobic   [name], NP

## 2021-08-16 NOTE — Addendum Note (Signed)
Addended by: Rosalyn Gess D on: 08/16/2021 09:33 AM   Modules accepted: Orders

## 2021-08-17 LAB — PTH, INTACT AND CALCIUM
Calcium: 9.7 mg/dL (ref 8.6–10.3)
PTH: 63 pg/mL (ref 16–77)

## 2021-08-17 LAB — VARICELLA ZOSTER ANTIBODY, IGG: Varicella IgG: 1736 index

## 2021-08-24 ENCOUNTER — Other Ambulatory Visit: Payer: Self-pay | Admitting: Adult Health

## 2021-08-24 DIAGNOSIS — I10 Essential (primary) hypertension: Secondary | ICD-10-CM

## 2021-09-22 ENCOUNTER — Ambulatory Visit (INDEPENDENT_AMBULATORY_CARE_PROVIDER_SITE_OTHER): Payer: PPO | Admitting: Adult Health

## 2021-09-22 ENCOUNTER — Encounter: Payer: Self-pay | Admitting: Adult Health

## 2021-09-22 VITALS — BP 100/80 | HR 83 | Temp 97.0°F | Ht 70.0 in | Wt 194.0 lb

## 2021-09-22 DIAGNOSIS — M5412 Radiculopathy, cervical region: Secondary | ICD-10-CM | POA: Diagnosis not present

## 2021-09-22 NOTE — Progress Notes (Signed)
Subjective:    Patient ID: [name], male    DOB: [birth year], 80 y.o.   MRN: [id number]  HPI 80 year old male who  has a past medical history of Actinic keratosis, Adult acne, Allergic rhinitis, Asthma, Cataracts, bilateral, Hyperlipidemia, Hypertension, Idiopathic parathyroidism ([place]), Osteoarthritis, Seasonal allergies, and Seizure ([place]) (1990's).  He presents to the office today for an acute issue.  Symptoms started last week.  Prior to the symptoms started and he had played tennis, and put up Christmas decorations, and was changing air filters in his home.  Soon after that he developed a sharp pain that started at the base of his neck and radiated down his right arm.  Reports that the pain was worse when sitting for extended periods of time and with range of motion of his head.  Symptoms lasted for 4 to 5 days and then started to improve and eventually resolved.  He is no longer having any symptoms.  He did have decreased grip strength in his right arm during   Review of Systems See HPI   Past Medical History:  Diagnosis Date   Actinic keratosis    Adult acne    Allergic rhinitis    Asthma    Cataracts, bilateral    Hyperlipidemia    Hypertension    Idiopathic parathyroidism (HCC)    Osteoarthritis    Seasonal allergies    Seizure ([place]) 1990's   no seizure activity since    Social History   Socioeconomic History   Marital status: Married    Spouse name: Not on file   Number of children: Not on file   Years of education: Not on file   Highest education level: Not on file  Occupational History   Not on file  Tobacco Use   Smoking status: Never   Smokeless tobacco: Never  Substance and Sexual Activity   Alcohol use: Yes    Alcohol/week: 2.0 standard drinks    Types: 2 Cans of beer per week   Drug use: No   Sexual activity: Not on file  Other Topics Concern   Not on file  Social History Narrative   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   Married     No kids       He likes to play tennis    Social Determinants of Radio broadcast assistant Strain: Low Risk    Difficulty of Paying Living Expenses: Not hard at all  Food Insecurity: Not on file  Transportation Needs: No Transportation Needs   Lack of Transportation (Medical): No   Lack of Transportation (Non-Medical): No  Physical Activity: Insufficiently Active   Days of Exercise per Week: 2 days   Minutes of Exercise per Session: 60 min  Stress: No Stress Concern Present   Feeling of Stress : Not at all  Social Connections: Moderately Integrated   Frequency of Communication with Friends and Family: Three times a week   Frequency of Social Gatherings with Friends and Family: Three times a week   Attends Religious Services: Never   Active Member of Clubs or Organizations: Yes   Attends Music therapist: More than 4 times per year   Marital Status: Married  Human resources officer Violence: Not At Risk   Fear of Current or Ex-Partner: No   Emotionally Abused: No   Physically Abused: No   Sexually Abused: No    Past Surgical History:  Procedure Laterality Date   anal cyst  APPENDECTOMY     CATARACT EXTRACTION, BILATERAL     PARATHYROIDECTOMY N/A 03/10/2015   Procedure: PARATHYROIDECTOMY;  Surgeon: Armandina Gemma, MD;  Location: [facility];  Service: General;  Laterality: N/A;   REPLACEMENT TOTAL KNEE Bilateral     Family History  Problem Relation Age of Onset   Stroke Mother    Colon cancer Neg Hx    Esophageal cancer Neg Hx    Rectal cancer Neg Hx    Stomach cancer Neg Hx     Allergies  Allergen Reactions   Shrimp Flavor     REACTION: rash    Current Outpatient Medications on File Prior to Visit  Medication Sig Dispense Refill   Ascorbic Acid (VITAMIN C) 100 MG tablet Take 500 mg by mouth daily.     fish oil-omega-3 fatty acids 1000 MG capsule Take 1 g by mouth daily.      furosemide (LASIX) 20 MG tablet TAKE 1 TABLET(20 MG) BY MOUTH DAILY 270 tablet 0    lisinopril (ZESTRIL) 10 MG tablet Take 1 tablet (10 mg total) by mouth daily. 90 tablet 3   loratadine (CLARITIN) 10 MG tablet Take 10 mg by mouth daily.     meloxicam (MOBIC) 15 MG tablet TAKE 1 TABLET(15 MG) BY MOUTH DAILY 90 tablet 0   Multiple Vitamin tablet Take 1 a day 30 tablet 0   simvastatin (ZOCOR) 20 MG tablet TAKE 1 TABLET(20 MG) BY MOUTH AT BEDTIME 90 tablet 0   No current facility-administered medications on file prior to visit.    BP 100/80   Pulse 83   Temp (!) 97 F (36.1 C) (Temporal)   Ht 5\' 10"  (1.778 m)   Wt 194 lb (88 kg)   SpO2 97%   BMI 27.84 kg/m       Objective:   Physical Exam Vitals and nursing note reviewed.  Constitutional:      Appearance: Normal appearance.  Musculoskeletal:        General: No swelling or tenderness. Normal range of motion.  Neurological:     General: No focal deficit present.     Mental Status: He is alert and oriented to person, place, and time.  Psychiatric:        Mood and Affect: Mood normal.        Behavior: Behavior normal.        Thought Content: Thought content normal.       Assessment & Plan:  1. Cervical radiculopathy -Symptoms have resolved.  Reassurance given that this was likely muscle strain/nerve impingement.  Red flags reviewed.  Advised that if it happens again and either follow-up or try using anti-inflammatories, gentle stretching exercises, and a heating pad at home.  Would also be a good idea to stretch prior to activity.  [name], NP

## 2021-10-15 ENCOUNTER — Other Ambulatory Visit: Payer: Self-pay | Admitting: Adult Health

## 2021-10-15 DIAGNOSIS — M1990 Unspecified osteoarthritis, unspecified site: Secondary | ICD-10-CM

## 2021-10-30 DIAGNOSIS — M19011 Primary osteoarthritis, right shoulder: Secondary | ICD-10-CM | POA: Diagnosis not present

## 2021-10-30 DIAGNOSIS — M5412 Radiculopathy, cervical region: Secondary | ICD-10-CM | POA: Diagnosis not present

## 2021-11-03 DIAGNOSIS — M542 Cervicalgia: Secondary | ICD-10-CM | POA: Diagnosis not present

## 2021-11-03 DIAGNOSIS — M5010 Cervical disc disorder with radiculopathy, unspecified cervical region: Secondary | ICD-10-CM | POA: Diagnosis not present

## 2021-11-07 DIAGNOSIS — M5010 Cervical disc disorder with radiculopathy, unspecified cervical region: Secondary | ICD-10-CM | POA: Diagnosis not present

## 2021-11-07 DIAGNOSIS — M542 Cervicalgia: Secondary | ICD-10-CM | POA: Diagnosis not present

## 2021-11-13 DIAGNOSIS — M5010 Cervical disc disorder with radiculopathy, unspecified cervical region: Secondary | ICD-10-CM | POA: Diagnosis not present

## 2021-11-13 DIAGNOSIS — M542 Cervicalgia: Secondary | ICD-10-CM | POA: Diagnosis not present

## 2021-11-15 DIAGNOSIS — M542 Cervicalgia: Secondary | ICD-10-CM | POA: Diagnosis not present

## 2021-11-15 DIAGNOSIS — M5010 Cervical disc disorder with radiculopathy, unspecified cervical region: Secondary | ICD-10-CM | POA: Diagnosis not present

## 2021-11-22 DIAGNOSIS — M542 Cervicalgia: Secondary | ICD-10-CM | POA: Diagnosis not present

## 2021-11-22 DIAGNOSIS — M5010 Cervical disc disorder with radiculopathy, unspecified cervical region: Secondary | ICD-10-CM | POA: Diagnosis not present

## 2021-11-29 DIAGNOSIS — M5010 Cervical disc disorder with radiculopathy, unspecified cervical region: Secondary | ICD-10-CM | POA: Diagnosis not present

## 2021-11-29 DIAGNOSIS — M542 Cervicalgia: Secondary | ICD-10-CM | POA: Diagnosis not present

## 2021-12-07 DIAGNOSIS — M542 Cervicalgia: Secondary | ICD-10-CM | POA: Diagnosis not present

## 2021-12-07 DIAGNOSIS — M5010 Cervical disc disorder with radiculopathy, unspecified cervical region: Secondary | ICD-10-CM | POA: Diagnosis not present

## 2022-01-03 ENCOUNTER — Telehealth: Payer: PPO

## 2022-01-11 DIAGNOSIS — L723 Sebaceous cyst: Secondary | ICD-10-CM | POA: Diagnosis not present

## 2022-01-11 DIAGNOSIS — D1801 Hemangioma of skin and subcutaneous tissue: Secondary | ICD-10-CM | POA: Diagnosis not present

## 2022-01-11 DIAGNOSIS — L821 Other seborrheic keratosis: Secondary | ICD-10-CM | POA: Diagnosis not present

## 2022-01-11 DIAGNOSIS — L570 Actinic keratosis: Secondary | ICD-10-CM | POA: Diagnosis not present

## 2022-01-22 ENCOUNTER — Other Ambulatory Visit: Payer: Self-pay | Admitting: Adult Health

## 2022-01-24 ENCOUNTER — Other Ambulatory Visit: Payer: Self-pay | Admitting: Adult Health

## 2022-01-24 DIAGNOSIS — M1990 Unspecified osteoarthritis, unspecified site: Secondary | ICD-10-CM

## 2022-02-05 ENCOUNTER — Other Ambulatory Visit: Payer: Self-pay | Admitting: Adult Health

## 2022-02-05 DIAGNOSIS — I10 Essential (primary) hypertension: Secondary | ICD-10-CM

## 2022-04-26 ENCOUNTER — Other Ambulatory Visit: Payer: Self-pay | Admitting: Adult Health

## 2022-04-26 DIAGNOSIS — M1990 Unspecified osteoarthritis, unspecified site: Secondary | ICD-10-CM

## 2022-04-27 DIAGNOSIS — M19011 Primary osteoarthritis, right shoulder: Secondary | ICD-10-CM | POA: Diagnosis not present

## 2022-04-30 ENCOUNTER — Other Ambulatory Visit: Payer: Self-pay | Admitting: Orthopedic Surgery

## 2022-04-30 DIAGNOSIS — M19021 Primary osteoarthritis, right elbow: Secondary | ICD-10-CM

## 2022-05-10 ENCOUNTER — Ambulatory Visit
Admission: RE | Admit: 2022-05-10 | Discharge: 2022-05-10 | Disposition: A | Discharge status: Home / Self Care | Payer: PPO | Source: Ambulatory Visit | Attending: Orthopedic Surgery | Admitting: Orthopedic Surgery

## 2022-05-10 DIAGNOSIS — M19021 Primary osteoarthritis, right elbow: Secondary | ICD-10-CM

## 2022-05-10 DIAGNOSIS — R200 Anesthesia of skin: Secondary | ICD-10-CM | POA: Diagnosis not present

## 2022-05-10 DIAGNOSIS — Z01818 Encounter for other preprocedural examination: Secondary | ICD-10-CM | POA: Diagnosis not present

## 2022-05-10 DIAGNOSIS — M19011 Primary osteoarthritis, right shoulder: Secondary | ICD-10-CM | POA: Diagnosis not present

## 2022-05-10 DIAGNOSIS — R202 Paresthesia of skin: Secondary | ICD-10-CM | POA: Diagnosis not present

## 2022-05-15 ENCOUNTER — Telehealth: Payer: Self-pay | Admitting: Adult Health

## 2022-05-15 DIAGNOSIS — M19011 Primary osteoarthritis, right shoulder: Secondary | ICD-10-CM | POA: Diagnosis not present

## 2022-05-15 NOTE — Telephone Encounter (Signed)
error 

## 2022-05-18 ENCOUNTER — Encounter: Payer: Self-pay | Admitting: Adult Health

## 2022-05-18 ENCOUNTER — Ambulatory Visit (INDEPENDENT_AMBULATORY_CARE_PROVIDER_SITE_OTHER): Payer: PPO | Admitting: Adult Health

## 2022-05-18 ENCOUNTER — Ambulatory Visit (INDEPENDENT_AMBULATORY_CARE_PROVIDER_SITE_OTHER): Payer: PPO

## 2022-05-18 VITALS — BP 100/80 | HR 72 | Temp 98.5°F | Ht 68.75 in | Wt 190.0 lb

## 2022-05-18 DIAGNOSIS — L720 Epidermal cyst: Secondary | ICD-10-CM | POA: Diagnosis not present

## 2022-05-18 DIAGNOSIS — Z01818 Encounter for other preprocedural examination: Secondary | ICD-10-CM

## 2022-05-18 LAB — CBC WITH DIFFERENTIAL/PLATELET
Basophils Absolute: 0.1 10*3/uL (ref 0.0–0.1)
Basophils Relative: 0.7 % (ref 0.0–3.0)
Eosinophils Absolute: 0.4 10*3/uL (ref 0.0–0.7)
Eosinophils Relative: 4.4 % (ref 0.0–5.0)
HCT: 45.5 % (ref 39.0–52.0)
Hemoglobin: 15.7 g/dL (ref 13.0–17.0)
Lymphocytes Relative: 19.5 % (ref 12.0–46.0)
Lymphs Abs: 1.7 10*3/uL (ref 0.7–4.0)
MCHC: 34.5 g/dL (ref 30.0–36.0)
MCV: 95.4 fl (ref 78.0–100.0)
Monocytes Absolute: 0.6 10*3/uL (ref 0.1–1.0)
Monocytes Relative: 7.2 % (ref 3.0–12.0)
Neutro Abs: 6 10*3/uL (ref 1.4–7.7)
Neutrophils Relative %: 68.2 % (ref 43.0–77.0)
Platelets: 184 10*3/uL (ref 150.0–400.0)
RBC: 4.77 Mil/uL (ref 4.22–5.81)
RDW: 12.4 % (ref 11.5–15.5)
WBC: 8.8 10*3/uL (ref 4.0–10.5)

## 2022-05-18 LAB — COMPREHENSIVE METABOLIC PANEL
ALT: 37 U/L (ref 0–53)
AST: 23 U/L (ref 0–37)
Albumin: 4.5 g/dL (ref 3.5–5.2)
Alkaline Phosphatase: 66 U/L (ref 39–117)
BUN: 29 mg/dL — ABNORMAL HIGH (ref 6–23)
CO2: 27 mEq/L (ref 19–32)
Calcium: 9.8 mg/dL (ref 8.4–10.5)
Chloride: 103 mEq/L (ref 96–112)
Creatinine, Ser: 1.55 mg/dL — ABNORMAL HIGH (ref 0.40–1.50)
GFR: 42.3 mL/min — ABNORMAL LOW (ref >60.00)
Glucose, Bld: 84 mg/dL (ref 70–99)
Potassium: 5.4 mEq/L — ABNORMAL HIGH (ref 3.5–5.1)
Sodium: 136 mEq/L (ref 135–145)
Total Bilirubin: 0.8 mg/dL (ref 0.2–1.2)
Total Protein: 7.4 g/dL (ref 6.0–8.3)

## 2022-05-18 NOTE — Progress Notes (Signed)
Subjective:    Patient ID: Paul Lowe, male    DOB: 10-12-1941, 81 y.o.   MRN: [id number]  HPI 81 year old male who  has a past medical history of Actinic keratosis, Adult acne, Allergic rhinitis, Asthma, Cataracts, bilateral, Hyperlipidemia, Hypertension, Idiopathic parathyroidism ([place]), Osteoarthritis, Seasonal allergies, and Seizure ([place]) (1990's).  He presents to the office today for pre surgical clearance. He will be having a right total shoulder replacement done by Dr. Griffin Basil at [facility]. He is ready to have this shoulder replacement completed. He has no questions about the surgery itself.   Additionally he has a cyst in his mid upper back that he would like drained. This cyst has been becoming larger. No drainage or pain   Review of Systems See HPI   Past Medical History:  Diagnosis Date   Actinic keratosis    Adult acne    Allergic rhinitis    Asthma    Cataracts, bilateral    Hyperlipidemia    Hypertension    Idiopathic parathyroidism (HCC)    Osteoarthritis    Seasonal allergies    Seizure ([place]) 1990's   no seizure activity since    Social History   Socioeconomic History   Marital status: Married    Spouse name: Not on file   Number of children: Not on file   Years of education: Not on file   Highest education level: Not on file  Occupational History   Not on file  Tobacco Use   Smoking status: Never   Smokeless tobacco: Never  Substance and Sexual Activity   Alcohol use: Yes    Alcohol/week: 2.0 standard drinks of alcohol    Types: 2 Cans of beer per week   Drug use: No   Sexual activity: Not on file  Other Topics Concern   Not on file  Social History Narrative   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   Married    No kids       He likes to play tennis    Social Determinants of Radio broadcast assistant Strain: Low Risk  (06/21/2021)   Overall Financial Resource Strain (CARDIA)    Difficulty of Paying Living  Expenses: Not hard at all  Food Insecurity: No Food Insecurity (08/03/2020)   Hunger Vital Sign    Worried About Running Out of Food in the Last Year: Never true    [place] in the Last Year: Never true  Transportation Needs: No Transportation Needs (06/21/2021)   PRAPARE - Hydrologist (Medical): No    Lack of Transportation (Non-Medical): No  Physical Activity: Insufficiently Active (06/21/2021)   Exercise Vital Sign    Days of Exercise per Week: 2 days    Minutes of Exercise per Session: 60 min  Stress: No Stress Concern Present (06/21/2021)   [place]    Feeling of Stress : Not at all  Social Connections: Moderately Integrated (06/21/2021)   Social Connection and Isolation Panel [NHANES]    Frequency of Communication with Friends and Family: Three times a week    Frequency of Social Gatherings with Friends and Family: Three times a week    Attends Religious Services: Never    Active Member of Clubs or Organizations: Yes    Attends Music therapist: More than 4 times per year    Marital Status: Married  Human resources officer Violence:  Not At Risk (06/21/2021)   Humiliation, Afraid, Rape, and Kick questionnaire    Fear of Current or Ex-Partner: No    Emotionally Abused: No    Physically Abused: No    Sexually Abused: No    Past Surgical History:  Procedure Laterality Date   anal cyst     APPENDECTOMY     CATARACT EXTRACTION, BILATERAL     PARATHYROIDECTOMY N/A 03/10/2015   Procedure: PARATHYROIDECTOMY;  Surgeon: Armandina Gemma, MD;  Location: [facility];  Service: General;  Laterality: N/A;   REPLACEMENT TOTAL KNEE Bilateral     Family History  Problem Relation Age of Onset   Stroke Mother    Colon cancer Neg Hx    Esophageal cancer Neg Hx    Rectal cancer Neg Hx    Stomach cancer Neg Hx     Allergies  Allergen Reactions   Shrimp Flavor     REACTION: rash    Current  Outpatient Medications on File Prior to Visit  Medication Sig Dispense Refill   Ascorbic Acid (VITAMIN C) 100 MG tablet Take 500 mg by mouth daily.     fish oil-omega-3 fatty acids 1000 MG capsule Take 1 g by mouth daily.      furosemide (LASIX) 20 MG tablet TAKE ONE TABLET BY MOUTH EVERY DAY 270 tablet 0   lisinopril (ZESTRIL) 10 MG tablet Take 1 tablet (10 mg total) by mouth daily. 90 tablet 3   loratadine (CLARITIN) 10 MG tablet Take 10 mg by mouth daily.     meloxicam (MOBIC) 15 MG tablet TAKE 1 TABLET(15 MG) BY MOUTH DAILY 90 tablet 0   Multiple Vitamin tablet Take 1 a day 30 tablet 0   simvastatin (ZOCOR) 20 MG tablet Take 1 tablet (20 mg total) by mouth daily at 6 PM. 90 tablet 1   No current facility-administered medications on file prior to visit.    BP 100/80   Pulse 72   Temp 98.5 F (36.9 C) (Oral)   Ht 5' 8.75" (1.746 m)   Wt 190 lb (86.2 kg)   SpO2 95%   BMI 28.26 kg/m       Objective:   Physical Exam Vitals and nursing note reviewed.  Constitutional:      Appearance: Normal appearance.  Cardiovascular:     Rate and Rhythm: Normal rate and regular rhythm.     Pulses: Normal pulses.     Heart sounds: Normal heart sounds.  Pulmonary:     Effort: Pulmonary effort is normal.     Breath sounds: Normal breath sounds.  Musculoskeletal:        General: Normal range of motion.     Right lower leg: No edema.     Left lower leg: No edema.  Skin:    General: Skin is warm and dry.     Comments: Quarter sized epidermoid cyst noted on mid upper back. Cyst has a large black head  Neurological:     General: No focal deficit present.     Mental Status: He is alert and oriented to person, place, and time.  Psychiatric:        Mood and Affect: Mood normal.        Behavior: Behavior normal.        Thought Content: Thought content normal.        Judgment: Judgment normal.       Assessment & Plan:  1. Pre-operative clearance  - CBC with Differential/Platelet;  Future - Comprehensive metabolic panel;  Future - DG Chest 2 View; Future - EKG 12-Lead- Sinus  Rhythm  -First degree A-V block  PRi = 268 -  Nonspecific T-abnormality. Rate 68 - consistent with previous EKGS - Comprehensive metabolic panel - CBC with Differential/Platelet  2. Epidermoid cyst of skin of back Using pressure,  keratin material was easily expressed. The sac was easily removed with forceps. There was no need for incision and drainage. Patient tolerated procedure well.   [name], NP

## 2022-05-24 ENCOUNTER — Encounter (HOSPITAL_BASED_OUTPATIENT_CLINIC_OR_DEPARTMENT_OTHER): Payer: Self-pay | Admitting: Orthopaedic Surgery

## 2022-05-25 ENCOUNTER — Encounter (HOSPITAL_BASED_OUTPATIENT_CLINIC_OR_DEPARTMENT_OTHER)
Admission: RE | Admit: 2022-05-25 | Discharge: 2022-05-25 | Disposition: A | Discharge status: Home / Self Care | Payer: PPO | Source: Ambulatory Visit | Attending: Orthopaedic Surgery | Admitting: Orthopaedic Surgery

## 2022-05-25 DIAGNOSIS — Z01812 Encounter for preprocedural laboratory examination: Secondary | ICD-10-CM | POA: Insufficient documentation

## 2022-05-25 LAB — BASIC METABOLIC PANEL
Anion gap: 7 (ref 5–15)
BUN: 24 mg/dL — ABNORMAL HIGH (ref 8–23)
CO2: 24 mmol/L (ref 22–32)
Calcium: 8.9 mg/dL (ref 8.9–10.3)
Chloride: 108 mmol/L (ref 98–111)
Creatinine, Ser: 1.47 mg/dL — ABNORMAL HIGH (ref 0.61–1.24)
GFR, Estimated: 48 mL/min — ABNORMAL LOW (ref >60)
Glucose, Bld: 114 mg/dL — ABNORMAL HIGH (ref 70–99)
Potassium: 5.4 mmol/L — ABNORMAL HIGH (ref 3.5–5.1)
Sodium: 139 mmol/L (ref 135–145)

## 2022-05-25 LAB — SURGICAL PCR SCREEN
MRSA, PCR: NEGATIVE
Staphylococcus aureus: NEGATIVE

## 2022-05-25 NOTE — Progress Notes (Signed)
Surgical soap given with instructions, pt verbalized understanding. Enhanced Recovery after Surgery  Enhanced Recovery after Surgery is a protocol used to improve the stress on your body and your recovery after surgery.  Patient Instructions  The night before surgery:  No food after midnight. ONLY clear liquids after midnight  The day of surgery (if you do NOT have diabetes):  Drink ONE (1) Pre-Surgery Clear Ensure as directed.   This drink was given to you during your hospital  pre-op appointment visit. The pre-op nurse will instruct you on the time to drink the  Pre-Surgery Ensure depending on your surgery time. Finish the drink at the designated time by the pre-op nurse.  Nothing else to drink after completing the  Pre-Surgery Clear Ensure.  The day of surgery (if you have diabetes): Drink ONE (1) Gatorade 2 (G2) as directed. This drink was given to you during your hospital  pre-op appointment visit.  The pre-op nurse will instruct you on the time to drink the   Gatorade 2 (G2) depending on your surgery time. Color of the Gatorade may vary. Red is not allowed. Nothing else to drink after completing the  Gatorade 2 (G2).         If office.you have questions, please contact your surgeon's office Enhanced Recovery after Surgery  Enhanced Recovery after Surgery is a protocol used to improve the stress on your body and your recovery after surgery.  Patient Instructions  The night before surgery:  No food after midnight. ONLY clear liquids after midnight  The day of surgery (if you do NOT have diabetes):  Drink ONE (1) Pre-Surgery Clear Ensure as directed.   This drink was given to you during your hospital  pre-op appointment visit. The pre-op nurse will instruct you on the time to drink the  Pre-Surgery Ensure depending on your surgery time. Finish the drink at the designated time by the pre-op nurse.  Nothing else to drink after completing the  Pre-Surgery Clear  Ensure.  The day of surgery (if you have diabetes): Drink ONE (1) Gatorade 2 (G2) as directed. This drink was given to you during your hospital  pre-op appointment visit.  The pre-op nurse will instruct you on the time to drink the   Gatorade 2 (G2) depending on your surgery time. Color of the Gatorade may vary. Red is not allowed. Nothing else to drink after completing the  Gatorade 2 (G2).         If office.you have questions, please contact your surgeon's officeEnhanced Recovery after Surgery  Enhanced Recovery after Surgery is a protocol used to improve the stress on your body and your recovery after surgery.  Patient Instructions  The night before surgery:  No food after midnight. ONLY clear liquids after midnight  The day of surgery (if you do NOT have diabetes):  Drink ONE (1) Pre-Surgery Clear Ensure as directed.   This drink was given to you during your hospital  pre-op appointment visit. The pre-op nurse will instruct you on the time to drink the  Pre-Surgery Ensure depending on your surgery time. Finish the drink at the designated time by the pre-op nurse.  Nothing else to drink after completing the  Pre-Surgery Clear Ensure.  The day of surgery (if you have diabetes): Drink ONE (1) Gatorade 2 (G2) as directed. This drink was given to you during your hospital  pre-op appointment visit.  The pre-op nurse will instruct you on the time to drink the   Gatorade 2 (  G2) depending on your surgery time. Color of the Gatorade may vary. Red is not allowed. Nothing else to drink after completing the  Gatorade 2 (G2).         If office.you have questions, please contact your surgeon's office Benzoyl peroxide gel given with written instructions, pt verbalized understanding.

## 2022-05-25 NOTE — H&P (Signed)
PREOPERATIVE H&P  Chief Complaint: right shoulder DJD  HPI: Paul Lowe is a 81 y.o. male who is scheduled for Procedure(s): REVERSE SHOULDER ARTHROPLASTY.   Patient has a past medical history significant for HLD, HTN, parathyroidism.   He is a 81 year old who has had 3-4 years of worsening right shoulder pain. This limits his functional activities. He is having pain every day. He has tried and failed non-operative measures.  Symptoms are rated as moderate to severe, and have been worsening.  This is significantly impairing activities of daily living.    Please see clinic note for further details on this patient's care.    He has elected for surgical management.   Past Medical History:  Diagnosis Date   Actinic keratosis    Adult acne    Allergic rhinitis    Asthma    Cataracts, bilateral    Hyperlipidemia    Hypertension    Idiopathic parathyroidism (HCC)    Osteoarthritis    Seasonal allergies    Seizure ([place]) 1990's   no seizure activity since   Past Surgical History:  Procedure Laterality Date   anal cyst     APPENDECTOMY     CATARACT EXTRACTION, BILATERAL     PARATHYROIDECTOMY N/A 03/10/2015   Procedure: PARATHYROIDECTOMY;  Surgeon: Armandina Gemma, MD;  Location: [facility];  Service: General;  Laterality: N/A;   REPLACEMENT TOTAL KNEE Bilateral    Social History   Socioeconomic History   Marital status: Married    Spouse name: Not on file   Number of children: Not on file   Years of education: Not on file   Highest education level: Not on file  Occupational History   Not on file  Tobacco Use   Smoking status: Never   Smokeless tobacco: Never  Vaping Use   Vaping Use: Never used  Substance and Sexual Activity   Alcohol use: Not Currently    Comment: occasionally   Drug use: No   Sexual activity: Not on file  Other Topics Concern   Not on file  Social History Narrative   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   Married    No kids       He  likes to play tennis    Social Determinants of Radio broadcast assistant Strain: Low Risk  (06/21/2021)   Overall Financial Resource Strain (CARDIA)    Difficulty of Paying Living Expenses: Not hard at all  Food Insecurity: No Food Insecurity (08/03/2020)   Hunger Vital Sign    Worried About Running Out of Food in the Last Year: Never true    [place] in the Last Year: Never true  Transportation Needs: No Transportation Needs (06/21/2021)   PRAPARE - Hydrologist (Medical): No    Lack of Transportation (Non-Medical): No  Physical Activity: Insufficiently Active (06/21/2021)   Exercise Vital Sign    Days of Exercise per Week: 2 days    Minutes of Exercise per Session: 60 min  Stress: No Stress Concern Present (06/21/2021)   [place]    Feeling of Stress : Not at all  Social Connections: Moderately Integrated (06/21/2021)   Social Connection and Isolation Panel [NHANES]    Frequency of Communication with Friends and Family: Three times a week    Frequency of Social Gatherings with Friends and Family: Three times a week  Attends Religious Services: Never    Active Member of Clubs or Organizations: Yes    Attends Music therapist: More than 4 times per year    Marital Status: Married   Family History  Problem Relation Age of Onset   Stroke Mother    Colon cancer Neg Hx    Esophageal cancer Neg Hx    Rectal cancer Neg Hx    Stomach cancer Neg Hx    Allergies  Allergen Reactions   Shrimp Flavor     REACTION: rash   Prior to Admission medications   Medication Sig Start Date End Date Taking? Authorizing Provider  Ascorbic Acid (VITAMIN C) 100 MG tablet Take 500 mg by mouth daily.   Yes [provider]  fish oil-omega-3 fatty acids 1000 MG capsule Take 1 g by mouth daily.    Yes [provider]  furosemide (LASIX) 20 MG tablet TAKE ONE TABLET BY MOUTH  EVERY DAY 02/06/22  Yes Nafziger, Tommi Rumps, NP  lisinopril (ZESTRIL) 10 MG tablet Take 1 tablet (10 mg total) by mouth daily. 08/16/21  Yes Nafziger, Tommi Rumps, NP  loratadine (CLARITIN) 10 MG tablet Take 10 mg by mouth daily.   Yes [provider]  meloxicam (MOBIC) 15 MG tablet TAKE 1 TABLET(15 MG) BY MOUTH DAILY 04/26/22  Yes Nafziger, Tommi Rumps, NP  Multiple Vitamin tablet Take 1 a day 11/12/14  Yes Philemon Kingdom, MD  simvastatin (ZOCOR) 20 MG tablet Take 1 tablet (20 mg total) by mouth daily at 6 PM. 01/24/22  Yes Nafziger, Tommi Rumps, NP    ROS: All other systems have been reviewed and were otherwise negative with the exception of those mentioned in the HPI and as above.  Physical Exam: General: Alert, no acute distress Cardiovascular: No pedal edema Respiratory: No cyanosis, no use of accessory musculature GI: No organomegaly, abdomen is soft and non-tender Skin: No lesions in the area of chief complaint Neurologic: Sensation intact distally Psychiatric: Patient is competent for consent with normal mood and affect Lymphatic: No axillary or cervical lymphadenopathy  MUSCULOSKELETAL:  On examination the range of motion of his right shoulder active to 100, passive 110, external rotation 20, internal rotation to the back pocket. Cuff strength is weak throughout.   Imaging: CT and X-rays are reviewed and demonstrate end stage erosive arthritis with medialization of the glenoid. Significant  osteophytosis and bone on bone articulation.   BMI: Estimated body mass index is 26.54 kg/m as calculated from the following:   Height as of this encounter: '5\' 10"'$  (1.778 m).   Weight as of this encounter: 83.9 kg.  Lab Results  Component Value Date   ALBUMIN 4.5 05/18/2022   Diabetes: Patient does not have a diagnosis of diabetes.     Smoking Status:   reports that he has never smoked. He has never used smokeless tobacco.     Assessment: right shoulder DJD  Plan: Plan for  Procedure(s): REVERSE SHOULDER ARTHROPLASTY  The risks benefits and alternatives were discussed with the patient including but not limited to the risks of nonoperative treatment, versus surgical intervention including infection, bleeding, nerve injury,  blood clots, cardiopulmonary complications, morbidity, mortality, among others, and they were willing to proceed.   We additionally specifically discussed risks of axillary nerve injury, infection, periprosthetic fracture, continued pain and longevity of implants prior to beginning procedure.    Patient will be closely monitored in PACU for medical stabilization and pain control. If found stable in PACU, patient may be discharged home with  outpatient follow-up. If any concerns regarding patient's stabilization patient will be admitted for observation after surgery. The patient is planning to be discharged home with outpatient PT.   The patient acknowledged the explanation, agreed to proceed with the plan and consent was signed.   He received operative clearance from his PCP [name] NP.  Operative Plan: Right reverse total shoulder arthroplasty  Discharge Medications: Standard DVT Prophylaxis: Aspirin Physical Therapy: Outpatient PT Special Discharge needs: [place]. [place], PA-C  05/25/2022 4:39 PM

## 2022-05-28 NOTE — Progress Notes (Signed)
K+ 5.4 Dr. Gloris Manchester aware, will repeat BMET day of surgery.

## 2022-05-30 NOTE — Anesthesia Preprocedure Evaluation (Signed)
Anesthesia Evaluation  Patient identified by MRN, date of birth, ID band Patient awake    Reviewed: Allergy & Precautions, NPO status , Patient's Chart, lab work & pertinent test results  History of Anesthesia Complications Negative for: history of anesthetic complications  Airway Mallampati: II  TM Distance: >3 FB Neck ROM: Full    Dental no notable dental hx.    Pulmonary asthma ,    Pulmonary exam normal        Cardiovascular hypertension, Pt. on medications Normal cardiovascular exam     Neuro/Psych Seizures - (1990s), Well Controlled,  negative psych ROS   GI/Hepatic negative GI ROS, Neg liver ROS,   Endo/Other  Idiopathic parathyroidism  Renal/GU Cr 1.47, K 5.4  negative genitourinary   Musculoskeletal  (+) Arthritis ,   Abdominal   Peds  Hematology negative hematology ROS (+)   Anesthesia Other Findings Day of surgery medications reviewed with patient.  Reproductive/Obstetrics negative OB ROS                            Anesthesia Physical Anesthesia Plan  ASA: 2  Anesthesia Plan: General   Post-op Pain Management: Tylenol PO (pre-op)* and Regional block*   Induction: Intravenous  PONV Risk Score and Plan: 2 and Treatment may vary due to age or medical condition, Dexamethasone and Ondansetron  Airway Management Planned: Oral ETT  Additional Equipment: None  Intra-op Plan:   Post-operative Plan: Extubation in OR  Informed Consent: I have reviewed the patients History and Physical, chart, labs and discussed the procedure including the risks, benefits and alternatives for the proposed anesthesia with the patient or authorized representative who has indicated his/her understanding and acceptance.     Dental advisory given  Plan Discussed with: CRNA  Anesthesia Plan Comments:        Anesthesia Quick Evaluation

## 2022-05-30 NOTE — Discharge Instructions (Signed)
Ophelia Charter MD, MPH Noemi Chapel, PA-C [place] [address], [address] [phone] (tel)   [phone] (fax)   [place] may leave the operative dressing in place until your follow-up appointment. KEEP THE INCISIONS CLEAN AND DRY. There may be a small amount of fluid/bleeding leaking at the surgical site. This is normal after surgery.  If it fills with liquid or blood please call us immediately to change it for you. Use the provided ice machine or Ice packs as often as possible for the first 3-4 days, then as needed for pain relief.   Keep a layer of cloth or a shirt between your skin and the cooling unit to prevent frost bite as it can get very cold.  SHOWERING: - You may shower on Post-Op Day #2.  - The dressing is water resistant but do not scrub it as it may start to peel up.   - You may remove the sling for showering - Gently pat the area dry.  - Do not soak the shoulder in water.  - Do not go swimming in the pool or ocean until your incision has completely healed (about 4-6 weeks after surgery) - KEEP THE INCISIONS CLEAN AND DRY.  EXERCISES Wear the sling at all times  You may remove the sling for showering, but keep the arm across the chest or in a secondary sling.    Accidental/Purposeful External Rotation and shoulder flexion (reaching behind you) is to be avoided at all costs for the first month. It is ok to come out of your sling if your are sitting and have assistance for eating.   Do not lift anything heavier than 1 pound until we discuss it further in clinic.  It is normal for your fingers/hand to become more swollen after surgery and discolored from bruising.   This will resolve over the first few weeks usually after surgery. Please continue to ambulate and do not stay sitting or lying for too long.  Perform foot and wrist pumps to assist in circulation.  PHYSICAL  THERAPY - You will begin physical therapy soon after surgery (unless otherwise specified) - Please call to set up an appointment, if you do not already have one  - Let our office if there are any issues with scheduling your therapy  - You have a physical therapy appointment scheduled at [facility] (across the hall from our office) on Monday, August 21 at 10:30am   [place] The anesthesia team may have performed a nerve block for you this is a great tool used to minimize pain.   The block may start wearing off overnight (between 8-24 hours postop) When the block wears off, your pain may go from nearly zero to the pain you would have had postop without the block. This is an abrupt transition but nothing dangerous is happening.   This can be a challenging period but utilize your as needed pain medications to try and manage this period. We suggest you use the pain medication the first night prior to going to bed, to ease this transition.  You may take an extra dose of narcotic when this happens if needed   POST-OP MEDICATIONS- Multimodal approach to pain control In general your pain will be controlled with a combination of substances.  Prescriptions unless otherwise discussed are electronically sent to your pharmacy.  This is a carefully made plan we use to minimize narcotic  use.     Meloxicam - Anti-inflammatory medication taken on a scheduled basis Acetaminophen - Non-narcotic pain medicine taken on a scheduled basis  Oxycodone - This is a strong narcotic, to be used only on an "as needed" basis for SEVERE pain. Aspirin '81mg'$  - This medicine is used to minimize the risk of blood clots after surgery. Omeprazole - daily medicine to protect your stomach while taking anti-inflammatories.   Zofran -  take as needed for nausea   FOLLOW-UP If you develop a Fever (>101.5), Redness or Drainage from the surgical incision site, please call our office to arrange for an  evaluation. Please call the office to schedule a follow-up appointment for a wound check, 7-10 days post-operatively.  IF YOU HAVE ANY QUESTIONS, PLEASE FEEL FREE TO CALL OUR OFFICE.  HELPFUL INFORMATION  Your arm will be in a sling following surgery. You will be in this sling for the next 4 weeks.   You may be more comfortable sleeping in a semi-seated position the first few nights following surgery.  Keep a pillow propped under the elbow and forearm for comfort.  If you have a recliner type of chair it might be beneficial.  If not that is fine too, but it would be helpful to sleep propped up with pillows behind your operated shoulder as well under your elbow and forearm.  This will reduce pulling on the suture lines.  When dressing, put your operative arm in the sleeve first.  When getting undressed, take your operative arm out last.  Loose fitting, button-down shirts are recommended.  In most states it is against the law to drive while your arm is in a sling. And certainly against the law to drive while taking narcotics.  You may return to work/school in the next couple of days when you feel up to it. Desk work and typing in the sling is fine.  We suggest you use the pain medication the first night prior to going to bed, in order to ease any pain when the anesthesia wears off. You should avoid taking pain medications on an empty stomach as it will make you nauseous.  You should wean off your narcotic medicines as soon as you are able.     Most patients will be off or using minimal narcotics before their first postop appointment.   Do not drink alcoholic beverages or take illicit drugs when taking pain medications.  Pain medication may make you constipated.  Below are a few solutions to try in this order: Decrease the amount of pain medication if you aren't having pain. Drink lots of decaffeinated fluids. Drink prune juice and/or each dried prunes  If the first 3 don't work start with  additional solutions Take Colace - an over-the-counter stool softener Take Senokot - an over-the-counter laxative Take Miralax - a stronger over-the-counter laxative   Dental Antibiotics:  In most cases prophylactic antibiotics for Dental procdeures after total joint surgery are not necessary.  Exceptions are as follows:  1. History of prior total joint infection  2. Severely immunocompromised (Organ Transplant, cancer chemotherapy, Rheumatoid biologic meds such as [place])  3. Poorly controlled diabetes (A1C &gt; 8.0, blood glucose over 200)  If you have one of these conditions, contact your surgeon for an antibiotic prescription, prior to your dental procedure.   For more information including helpful videos and documents visit our website:   [email or web address]    No Tylenol until after 1pm today if needed  Post Anesthesia Home Care Instructions  Activity: Get plenty of rest for the remainder of the day. A responsible individual must stay with you for 24 hours following the procedure.  For the next 24 hours, DO NOT: -Drive a car -Paediatric nurse -Drink alcoholic beverages -Take any medication unless instructed by your physician -Make any legal decisions or sign important papers.  Meals: Start with liquid foods such as gelatin or soup. Progress to regular foods as tolerated. Avoid greasy, spicy, heavy foods. If nausea and/or vomiting occur, drink only clear liquids until the nausea and/or vomiting subsides. Call your physician if vomiting continues.  Special Instructions/Symptoms: Your throat may feel dry or sore from the anesthesia or the breathing tube placed in your throat during surgery. If this causes discomfort, gargle with warm salt water. The discomfort should disappear within 24 hours.  If you had a scopolamine patch placed behind your ear for the management of post- operative nausea and/or vomiting:  1. The medication in  the patch is effective for 72 hours, after which it should be removed.  Wrap patch in a tissue and discard in the trash. Wash hands thoroughly with soap and water. 2. You may remove the patch earlier than 72 hours if you experience unpleasant side effects which may include dry mouth, dizziness or visual disturbances. 3. Avoid touching the patch. Wash your hands with soap and water after contact with the patch.     Regional Anesthesia Blocks  1. Numbness or the inability to move the "blocked" extremity may last from 3-48 hours after placement. The length of time depends on the medication injected and your individual response to the medication. If the numbness is not going away after 48 hours, call your surgeon.  2. The extremity that is blocked will need to be protected until the numbness is gone and the  Strength has returned. Because you cannot feel it, you will need to take extra care to avoid injury. Because it may be weak, you may have difficulty moving it or using it. You may not know what position it is in without looking at it while the block is in effect.  3. For blocks in the legs and feet, returning to weight bearing and walking needs to be done carefully. You will need to wait until the numbness is entirely gone and the strength has returned. You should be able to move your leg and foot normally before you try and bear weight or walk. You will need someone to be with you when you first try to ensure you do not fall and possibly risk injury.  4. Bruising and tenderness at the needle site are common side effects and will resolve in a few days.  5. Persistent numbness or new problems with movement should be communicated to the surgeon or the [place] [phone] [place] [phone]).

## 2022-05-31 ENCOUNTER — Ambulatory Visit (HOSPITAL_COMMUNITY): Payer: PPO

## 2022-05-31 ENCOUNTER — Encounter (HOSPITAL_BASED_OUTPATIENT_CLINIC_OR_DEPARTMENT_OTHER)
Admission: RE | Disposition: A | Discharge status: Home / Self Care | Payer: Self-pay | Source: Home / Self Care | Attending: Orthopaedic Surgery

## 2022-05-31 ENCOUNTER — Ambulatory Visit (HOSPITAL_BASED_OUTPATIENT_CLINIC_OR_DEPARTMENT_OTHER): Payer: PPO | Admitting: Certified Registered"

## 2022-05-31 ENCOUNTER — Other Ambulatory Visit: Payer: Self-pay

## 2022-05-31 ENCOUNTER — Encounter (HOSPITAL_BASED_OUTPATIENT_CLINIC_OR_DEPARTMENT_OTHER): Payer: Self-pay | Admitting: Orthopaedic Surgery

## 2022-05-31 ENCOUNTER — Ambulatory Visit (HOSPITAL_BASED_OUTPATIENT_CLINIC_OR_DEPARTMENT_OTHER)
Admission: RE | Admit: 2022-05-31 | Discharge: 2022-05-31 | Disposition: A | Discharge status: Home / Self Care | Payer: PPO | Attending: Orthopaedic Surgery | Admitting: Orthopaedic Surgery

## 2022-05-31 DIAGNOSIS — E785 Hyperlipidemia, unspecified: Secondary | ICD-10-CM | POA: Insufficient documentation

## 2022-05-31 DIAGNOSIS — Z01818 Encounter for other preprocedural examination: Secondary | ICD-10-CM

## 2022-05-31 DIAGNOSIS — Z96611 Presence of right artificial shoulder joint: Secondary | ICD-10-CM | POA: Diagnosis not present

## 2022-05-31 DIAGNOSIS — J45909 Unspecified asthma, uncomplicated: Secondary | ICD-10-CM | POA: Diagnosis not present

## 2022-05-31 DIAGNOSIS — M19011 Primary osteoarthritis, right shoulder: Secondary | ICD-10-CM

## 2022-05-31 DIAGNOSIS — R569 Unspecified convulsions: Secondary | ICD-10-CM | POA: Diagnosis not present

## 2022-05-31 DIAGNOSIS — I10 Essential (primary) hypertension: Secondary | ICD-10-CM | POA: Insufficient documentation

## 2022-05-31 DIAGNOSIS — G8918 Other acute postprocedural pain: Secondary | ICD-10-CM | POA: Diagnosis not present

## 2022-05-31 DIAGNOSIS — Z471 Aftercare following joint replacement surgery: Secondary | ICD-10-CM | POA: Diagnosis not present

## 2022-05-31 HISTORY — PX: REVERSE SHOULDER ARTHROPLASTY: SHX5054

## 2022-05-31 LAB — BASIC METABOLIC PANEL
Anion gap: 8 (ref 5–15)
BUN: 25 mg/dL — ABNORMAL HIGH (ref 8–23)
CO2: 21 mmol/L — ABNORMAL LOW (ref 22–32)
Calcium: 8.9 mg/dL (ref 8.9–10.3)
Chloride: 110 mmol/L (ref 98–111)
Creatinine, Ser: 1.35 mg/dL — ABNORMAL HIGH (ref 0.61–1.24)
GFR, Estimated: 53 mL/min — ABNORMAL LOW (ref >60)
Glucose, Bld: 133 mg/dL — ABNORMAL HIGH (ref 70–99)
Potassium: 3.9 mmol/L (ref 3.5–5.1)
Sodium: 139 mmol/L (ref 135–145)

## 2022-05-31 SURGERY — ARTHROPLASTY, SHOULDER, TOTAL, REVERSE
Anesthesia: General | Site: Shoulder | Laterality: Right

## 2022-05-31 MED ORDER — PHENYLEPHRINE HCL (PRESSORS) 10 MG/ML IV SOLN
INTRAVENOUS | Status: AC
  Filled 2022-05-31: qty 1

## 2022-05-31 MED ORDER — LACTATED RINGERS IV BOLUS: 500.0000 mL | Freq: Once | INTRAVENOUS | Status: DC

## 2022-05-31 MED ORDER — MIDAZOLAM HCL 2 MG/2ML IJ SOLN
INTRAMUSCULAR | Status: AC
  Filled 2022-05-31: qty 2

## 2022-05-31 MED ORDER — TRANEXAMIC ACID-NACL 1000-0.7 MG/100ML-% IV SOLN
INTRAVENOUS | Status: AC
  Filled 2022-05-31: qty 100

## 2022-05-31 MED ORDER — VANCOMYCIN HCL 1000 MG IV SOLR
INTRAVENOUS | Status: AC
Start: 2022-05-31 — End: ?
  Filled 2022-05-31: qty 40

## 2022-05-31 MED ORDER — CEFAZOLIN SODIUM-DEXTROSE 2-4 GM/100ML-% IV SOLN
INTRAVENOUS | Status: AC
  Filled 2022-05-31: qty 100

## 2022-05-31 MED ORDER — GABAPENTIN 300 MG PO CAPS
ORAL_CAPSULE | ORAL | Status: AC
  Filled 2022-05-31: qty 1

## 2022-05-31 MED ORDER — DEXAMETHASONE SODIUM PHOSPHATE 10 MG/ML IJ SOLN
INTRAMUSCULAR | Status: AC
  Filled 2022-05-31: qty 1

## 2022-05-31 MED ORDER — MELOXICAM 15 MG PO TABS: 15.0000 mg | ORAL_TABLET | Freq: Every day | ORAL | 0 refills | Status: DC

## 2022-05-31 MED ORDER — PHENYLEPHRINE HCL (PRESSORS) 10 MG/ML IV SOLN
INTRAVENOUS | Status: AC
Start: 2022-05-31 — End: ?
  Filled 2022-05-31: qty 1

## 2022-05-31 MED ORDER — VANCOMYCIN HCL 1000 MG IV SOLR
INTRAVENOUS | Status: DC | PRN
  Administered 2022-05-31: 1000 mg via TOPICAL

## 2022-05-31 MED ORDER — PHENYLEPHRINE HCL-NACL 20-0.9 MG/250ML-% IV SOLN
INTRAVENOUS | Status: DC | PRN
  Administered 2022-05-31: 25 ug/min via INTRAVENOUS

## 2022-05-31 MED ORDER — FENTANYL CITRATE (PF) 100 MCG/2ML IJ SOLN
50.0000 ug | Freq: Once | INTRAMUSCULAR | Status: AC
  Administered 2022-05-31: 50 ug via INTRAVENOUS

## 2022-05-31 MED ORDER — FENTANYL CITRATE (PF) 100 MCG/2ML IJ SOLN
INTRAMUSCULAR | Status: AC
  Filled 2022-05-31: qty 2

## 2022-05-31 MED ORDER — ONDANSETRON HCL 4 MG/2ML IJ SOLN
INTRAMUSCULAR | Status: AC
  Filled 2022-05-31: qty 2

## 2022-05-31 MED ORDER — ACETAMINOPHEN 500 MG PO TABS
1000.0000 mg | ORAL_TABLET | Freq: Once | ORAL | Status: AC
  Administered 2022-05-31: 1000 mg via ORAL

## 2022-05-31 MED ORDER — BUPIVACAINE-EPINEPHRINE (PF) 0.25% -1:200000 IJ SOLN
INTRAMUSCULAR | Status: AC
Start: 2022-05-31 — End: ?
  Filled 2022-05-31: qty 30

## 2022-05-31 MED ORDER — ATROPINE SULFATE 0.4 MG/ML IV SOLN
INTRAVENOUS | Status: AC
  Filled 2022-05-31: qty 1

## 2022-05-31 MED ORDER — FENTANYL CITRATE (PF) 100 MCG/2ML IJ SOLN
INTRAMUSCULAR | Status: DC | PRN
Start: 2022-05-31 — End: 2022-05-31
  Administered 2022-05-31: 50 ug via INTRAVENOUS

## 2022-05-31 MED ORDER — 0.9 % SODIUM CHLORIDE (POUR BTL) OPTIME
TOPICAL | Status: DC | PRN
  Administered 2022-05-31: 1000 mL

## 2022-05-31 MED ORDER — ASPIRIN 81 MG PO CHEW: 81.0000 mg | CHEWABLE_TABLET | Freq: Two times a day (BID) | ORAL | 0 refills | Status: AC

## 2022-05-31 MED ORDER — STERILE WATER FOR IRRIGATION IR SOLN
Status: DC | PRN
  Administered 2022-05-31: 1000 mL

## 2022-05-31 MED ORDER — PROPOFOL 10 MG/ML IV BOLUS
INTRAVENOUS | Status: DC | PRN
  Administered 2022-05-31: 150 mg via INTRAVENOUS

## 2022-05-31 MED ORDER — ONDANSETRON HCL 4 MG PO TABS: 4.0000 mg | ORAL_TABLET | Freq: Three times a day (TID) | ORAL | 0 refills | Status: AC | PRN

## 2022-05-31 MED ORDER — PROPOFOL 10 MG/ML IV BOLUS
INTRAVENOUS | Status: AC
  Filled 2022-05-31: qty 20

## 2022-05-31 MED ORDER — LIDOCAINE 2% (20 MG/ML) 5 ML SYRINGE
INTRAMUSCULAR | Status: AC
  Filled 2022-05-31: qty 5

## 2022-05-31 MED ORDER — BUPIVACAINE HCL (PF) 0.25 % IJ SOLN
INTRAMUSCULAR | Status: AC
  Filled 2022-05-31: qty 30

## 2022-05-31 MED ORDER — OXYCODONE HCL 5 MG PO TABS: ORAL_TABLET | ORAL | 0 refills | Status: AC

## 2022-05-31 MED ORDER — BUPIVACAINE LIPOSOME 1.3 % IJ SUSP
INTRAMUSCULAR | Status: DC | PRN
  Administered 2022-05-31: 10 mL via PERINEURAL

## 2022-05-31 MED ORDER — EPHEDRINE 5 MG/ML INJ
INTRAVENOUS | Status: AC
  Filled 2022-05-31: qty 5

## 2022-05-31 MED ORDER — BUPIVACAINE-EPINEPHRINE (PF) 0.5% -1:200000 IJ SOLN
INTRAMUSCULAR | Status: DC | PRN
  Administered 2022-05-31: 15 mL via PERINEURAL

## 2022-05-31 MED ORDER — ACETAMINOPHEN 500 MG PO TABS
ORAL_TABLET | ORAL | Status: AC
  Filled 2022-05-31: qty 2

## 2022-05-31 MED ORDER — ACETAMINOPHEN 500 MG PO TABS: 1000.0000 mg | ORAL_TABLET | Freq: Three times a day (TID) | ORAL | 0 refills | Status: AC

## 2022-05-31 MED ORDER — DEXAMETHASONE SODIUM PHOSPHATE 10 MG/ML IJ SOLN
INTRAMUSCULAR | Status: DC | PRN
  Administered 2022-05-31: 5 mg via INTRAVENOUS

## 2022-05-31 MED ORDER — EPHEDRINE SULFATE (PRESSORS) 50 MG/ML IJ SOLN
INTRAMUSCULAR | Status: DC | PRN
  Administered 2022-05-31: 10 mg via INTRAVENOUS
  Administered 2022-05-31 (×2): 5 mg via INTRAVENOUS

## 2022-05-31 MED ORDER — LACTATED RINGERS IV SOLN: INTRAVENOUS | Status: DC

## 2022-05-31 MED ORDER — OXYCODONE HCL 5 MG/5ML PO SOLN: 5.0000 mg | Freq: Once | ORAL | Status: DC | PRN

## 2022-05-31 MED ORDER — SUGAMMADEX SODIUM 200 MG/2ML IV SOLN
INTRAVENOUS | Status: DC | PRN
  Administered 2022-05-31: 200 mg via INTRAVENOUS

## 2022-05-31 MED ORDER — OXYCODONE HCL 5 MG PO TABS: 5.0000 mg | ORAL_TABLET | Freq: Once | ORAL | Status: DC | PRN

## 2022-05-31 MED ORDER — OMEPRAZOLE 20 MG PO CPDR
20.0000 mg | DELAYED_RELEASE_CAPSULE | Freq: Every day | ORAL | 0 refills | Status: AC
Start: 2022-05-31 — End: 2022-06-30

## 2022-05-31 MED ORDER — CEFAZOLIN SODIUM-DEXTROSE 2-4 GM/100ML-% IV SOLN
2.0000 g | INTRAVENOUS | Status: AC
  Administered 2022-05-31: 2 g via INTRAVENOUS

## 2022-05-31 MED ORDER — LIDOCAINE 2% (20 MG/ML) 5 ML SYRINGE
INTRAMUSCULAR | Status: DC | PRN
  Administered 2022-05-31: 80 mg via INTRAVENOUS

## 2022-05-31 MED ORDER — ROCURONIUM BROMIDE 100 MG/10ML IV SOLN
INTRAVENOUS | Status: DC | PRN
  Administered 2022-05-31: 50 mg via INTRAVENOUS

## 2022-05-31 MED ORDER — FENTANYL CITRATE (PF) 100 MCG/2ML IJ SOLN: 25.0000 ug | INTRAMUSCULAR | Status: DC | PRN

## 2022-05-31 MED ORDER — SODIUM CHLORIDE (PF) 0.9 % IJ SOLN
INTRAMUSCULAR | Status: DC | PRN
  Administered 2022-05-31: 1000 mL

## 2022-05-31 MED ORDER — TRANEXAMIC ACID-NACL 1000-0.7 MG/100ML-% IV SOLN
1000.0000 mg | INTRAVENOUS | Status: AC
  Administered 2022-05-31: 1000 mg via INTRAVENOUS

## 2022-05-31 MED ORDER — GABAPENTIN 300 MG PO CAPS
300.0000 mg | ORAL_CAPSULE | Freq: Once | ORAL | Status: AC
  Administered 2022-05-31: 300 mg via ORAL

## 2022-05-31 MED ORDER — LACTATED RINGERS IV BOLUS: 250.0000 mL | Freq: Once | INTRAVENOUS | Status: DC

## 2022-05-31 SURGICAL SUPPLY — 75 items
AID PSTN UNV HD RSTRNT DISP (MISCELLANEOUS) ×1
APL PRP STRL LF DISP 70% ISPRP (MISCELLANEOUS) ×1
AUG BASEPLATE 15DEG 25 WEDGE (Joint) ×1 IMPLANT
AUGMENT BASEPLATE 15DEG 25 WDG (Joint) IMPLANT
BASEPLATE GLENOID STD REV 42 (Joint) IMPLANT
BIT DRILL 3.2 PERIPHERAL SCREW (BIT) IMPLANT
BLADE HEX COATED 2.75 (ELECTRODE) IMPLANT
BLADE SAW SGTL 73X25 THK (BLADE) ×1 IMPLANT
BLADE SURG 10 STRL SS (BLADE) IMPLANT
BLADE SURG 15 STRL LF DISP TIS (BLADE) IMPLANT
BLADE SURG 15 STRL SS (BLADE)
BNDG CMPR 5X4 CHSV STRCH STRL (GAUZE/BANDAGES/DRESSINGS)
BNDG COHESIVE 4X5 TAN STRL LF (GAUZE/BANDAGES/DRESSINGS) IMPLANT
BRUSH SCRUB EZ PLAIN DRY (MISCELLANEOUS) ×1 IMPLANT
BSPLAT GLND 15D 25 FULL WDG (Joint) ×1 IMPLANT
CHLORAPREP W/TINT 26 (MISCELLANEOUS) ×1 IMPLANT
CLSR STERI-STRIP ANTIMIC 1/2X4 (GAUZE/BANDAGES/DRESSINGS) ×1 IMPLANT
COOLER ICEMAN CLASSIC (MISCELLANEOUS) ×1 IMPLANT
COVER BACK TABLE 60X90IN (DRAPES) ×1 IMPLANT
COVER MAYO STAND STRL (DRAPES) ×1 IMPLANT
DRAPE IMP U-DRAPE 54X76 (DRAPES) ×1 IMPLANT
DRAPE INCISE IOBAN 66X45 STRL (DRAPES) ×1 IMPLANT
DRAPE U-SHAPE 76X120 STRL (DRAPES) ×2 IMPLANT
DRSG AQUACEL AG ADV 3.5X 6 (GAUZE/BANDAGES/DRESSINGS) ×1 IMPLANT
ELECT BLADE 4.0 EZ CLEAN MEGAD (MISCELLANEOUS) ×1
ELECT REM PT RETURN 9FT ADLT (ELECTROSURGICAL) ×1
ELECTRODE BLDE 4.0 EZ CLN MEGD (MISCELLANEOUS) ×1 IMPLANT
ELECTRODE REM PT RTRN 9FT ADLT (ELECTROSURGICAL) ×1 IMPLANT
FACESHIELD WRAPAROUND (MASK) ×2 IMPLANT
FACESHIELD WRAPAROUND OR TEAM (MASK) ×2 IMPLANT
GLOVE BIO SURGEON STRL SZ 6.5 (GLOVE) ×2 IMPLANT
GLOVE BIOGEL PI IND STRL 6.5 (GLOVE) ×1 IMPLANT
GLOVE BIOGEL PI IND STRL 8 (GLOVE) ×1 IMPLANT
GLOVE BIOGEL PI INDICATOR 6.5 (GLOVE) ×1
GLOVE BIOGEL PI INDICATOR 8 (GLOVE) ×1
GLOVE ECLIPSE 8.0 STRL XLNG CF (GLOVE) ×2 IMPLANT
GOWN STRL REUS W/ TWL LRG LVL3 (GOWN DISPOSABLE) ×2 IMPLANT
GOWN STRL REUS W/TWL LRG LVL3 (GOWN DISPOSABLE) ×2
GOWN STRL REUS W/TWL XL LVL3 (GOWN DISPOSABLE) ×1 IMPLANT
GUIDE PIN 3X75 SHOULDER (PIN) ×1
GUIDEWIRE GLENOID 2.5X220 (WIRE) IMPLANT
HANDPIECE INTERPULSE COAX TIP (DISPOSABLE) ×1
INSERT HUM THK+3X.75 42XSTRL (Joint) IMPLANT
INSERT REVERSED THICKNESS +3 (Joint) ×1 IMPLANT
INSRT HUM THK+3X.75 42XSTRL (Joint) ×1 IMPLANT
KIT SHOULDER STAB MARCO (KITS) ×1 IMPLANT
MANIFOLD NEPTUNE II (INSTRUMENTS) ×1 IMPLANT
PACK BASIN DAY SURGERY FS (CUSTOM PROCEDURE TRAY) ×1 IMPLANT
PACK SHOULDER (CUSTOM PROCEDURE TRAY) ×1 IMPLANT
PAD COLD SHLDR WRAP-ON (PAD) ×1 IMPLANT
PAD ORTHO SHOULDER 7X19 LRG (SOFTGOODS) ×1 IMPLANT
PENCIL SMOKE EVACUATOR (MISCELLANEOUS) IMPLANT
PIN GUIDE 3X75 SHOULDER (PIN) IMPLANT
RESTRAINT HEAD UNIVERSAL NS (MISCELLANEOUS) ×1 IMPLANT
SCREW 5.5X22 (Screw) IMPLANT
SCREW 5.5X26 (Screw) IMPLANT
SCREW BONE THREAD 6.5X35 (Screw) IMPLANT
SCREW PERIPHERAL 5.0X34 (Screw) IMPLANT
SET HNDPC FAN SPRY TIP SCT (DISPOSABLE) ×1 IMPLANT
SHEET MEDIUM DRAPE 40X70 STRL (DRAPES) ×1 IMPLANT
SLEEVE SCD COMPRESS KNEE MED (STOCKING) ×1 IMPLANT
SPIKE FLUID TRANSFER (MISCELLANEOUS) IMPLANT
SPONGE T-LAP 18X18 ~~LOC~~+RFID (SPONGE) ×1 IMPLANT
STEM HUM PERFORM 4 (Stem) IMPLANT
SUT ETHIBOND 2 V 37 (SUTURE) ×1 IMPLANT
SUT ETHIBOND NAB CT1 #1 30IN (SUTURE) ×1 IMPLANT
SUT FIBERWIRE #5 38 CONV NDL (SUTURE) ×4
SUT MNCRL AB 4-0 PS2 18 (SUTURE) ×1 IMPLANT
SUT VIC AB 0 CT1 27 (SUTURE)
SUT VIC AB 0 CT1 27XBRD ANBCTR (SUTURE) IMPLANT
SUT VIC AB 3-0 SH 27 (SUTURE) ×1
SUT VIC AB 3-0 SH 27X BRD (SUTURE) ×1 IMPLANT
SUTURE FIBERWR #5 38 CONV NDL (SUTURE) ×4 IMPLANT
TOWEL GREEN STERILE FF (TOWEL DISPOSABLE) ×3 IMPLANT
TUBE SUCTION HIGH CAP CLEAR NV (SUCTIONS) ×1 IMPLANT

## 2022-05-31 NOTE — Anesthesia Procedure Notes (Signed)
Procedure Name: Intubation Date/Time: 05/31/2022 8:22 AM  Performed by: Lavonia Dana, CRNAPre-anesthesia Checklist: Patient identified, Emergency Drugs available, Suction available and Patient being monitored Patient Re-evaluated:Patient Re-evaluated prior to induction Oxygen Delivery Method: Circle system utilized Preoxygenation: Pre-oxygenation with 100% oxygen Induction Type: IV induction Ventilation: Mask ventilation without difficulty Laryngoscope Size: Mac and 4 Grade View: Grade II Tube type: Oral Tube size: 7.5 mm Number of attempts: 1 Airway Equipment and Method: Stylet and Bite block Placement Confirmation: ETT inserted through vocal cords under direct vision, positive ETCO2 and breath sounds checked- equal and bilateral Secured at: 23 cm Tube secured with: Tape Dental Injury: Teeth and Oropharynx as per pre-operative assessment

## 2022-05-31 NOTE — Transfer of Care (Signed)
Immediate Anesthesia Transfer of Care Note  Patient: Paul Lowe  Procedure(s) Performed: REVERSE SHOULDER ARTHROPLASTY (Right: Shoulder)  Patient Location: PACU  Anesthesia Type:GA combined with regional for post-op pain  Level of Consciousness: drowsy  Airway & Oxygen Therapy: Patient Spontanous Breathing and Patient connected to face mask oxygen  Post-op Assessment: Report given to RN and Post -op Vital signs reviewed and stable  Post vital signs: Reviewed and stable  Last Vitals:  Vitals Value Taken Time  BP 132/75 05/31/22 0958  Temp 36.3 C 05/31/22 0958  Pulse 63 05/31/22 1000  Resp 16 05/31/22 1000  SpO2 97 % 05/31/22 1000  Vitals shown include unvalidated device data.  Last Pain:  Vitals:   05/31/22 0718  TempSrc: Oral  PainSc:       Patients Stated Pain Goal: 5 (55/37/48 2707)  Complications: No notable events documented.

## 2022-05-31 NOTE — Interval H&P Note (Signed)
All questions answered, patient wants to proceed with procedure. ? ?

## 2022-05-31 NOTE — Op Note (Signed)
Orthopaedic Surgery Operative Note (CSN: [id number])  Paul Lowe  1941-03-14 Date of Surgery: 05/31/2022   Diagnoses:  Right shoulder end-stage osteoarthritis with B3 glenoid  Procedure: Right reverse lateralized augmented total Shoulder Arthroplasty   Operative Finding Successful completion of planned procedure.  Patient had significant wear and erosion of the glenoid with a very dished appearance.  We had used 3D preoperative planning in order to appropriately position our implant.  We able to perform an osteotomy of the glenoid inferiorly to avoid impingement.  Multiple loose bodies were removed.  Preoperative motion was about 90 degrees of forward flexion and postoperatively there is no impingement and good rotational stability.  Axillary nerve tug test was normal.  Post-operative plan: The patient will be NWB in sling.  The patient will be will be discharged from PACU if continues to be stable as was plan prior to surgery.  DVT prophylaxis Aspirin 81 mg twice daily for 6 weeks.  Pain control with PRN pain medication preferring oral medicines.  Follow up plan will be scheduled in approximately 7 days for incision check and XR.  Physical therapy to start immediately.  Implants: Tornier perform humeral size 4 stem, +3 polyethylene, 42 standard glenosphere, 25 full wedge augmented baseplate, 35 center screw and 4 peripheral screws  Post-Op Diagnosis: Same Surgeons:Primary: Hiram Gash, MD Assistants:Caroline McBane PA-C Location: [facility] OR ROOM 6 Anesthesia: General with Exparel Interscalene Antibiotics: Ancef 2g preop, Vancomycin '1000mg'$  locally Tourniquet time: None Estimated Blood Loss: 782 Complications: None Specimens: None Implants: Implant Name Type Inv. Item Serial No. Manufacturer Lot No. LRB No. Used Action  AUG BASEPLATE 15DEG 25 WEDGE - [id number] Joint AUG BASEPLATE 15DEG 25 WEDGE [id number] [employer]  Right 1 Implanted  BASEPLATE GLENOID STD REV 42 - [id number]  Joint BASEPLATE GLENOID STD REV 42 [id number] [employer]  Right 1 Implanted  INSERT REVERSED THICKNESS +3 - [id number] Joint INSERT REVERSED THICKNESS +3 [id number] [employer]  Right 1 Implanted  STEM HUM PERFORM 4 - [id number] Stem STEM HUM PERFORM 4 [id number] [employer]  Right 1 Implanted  SCREW BONE THREAD 6.5X35 - [id number] Screw SCREW BONE THREAD 6.5X35  [employer]  Right 1 Implanted  SCREW PERIPHERAL 5.0X34 - [id number] Screw SCREW PERIPHERAL 5.0X34  [employer]  Right 1 Implanted  SCREW 5.5X22 - [id number] Screw SCREW 5.5X22  [employer]  Right 2 Implanted  SCREW 5.5X26 - [id number] Screw SCREW 5.5X26  [employer]  Right 1 Implanted    Indications for Surgery:   Paul Lowe is a 81 y.o. male with end-stage glenohumeral arthritis.  Benefits and risks of operative and nonoperative management were discussed prior to surgery with patient/guardian(s) and informed consent form was completed.  Infection and need for further surgery were discussed as was prosthetic stability and cuff issues.  We additionally specifically discussed risks of axillary nerve injury, infection, periprosthetic fracture, continued pain and longevity of implants prior to beginning procedure.      Procedure:   The patient was identified in the preoperative holding area where the surgical site was marked. Block placed by anesthesia with exparel.  The patient was taken to the OR where a procedural timeout was called and the above noted anesthesia was induced.  The patient was positioned beachchair on allen table with spider arm positioner.  Preoperative antibiotics were dosed.  The patient's right shoulder was prepped and draped in the usual sterile fashion.  A second preoperative timeout was called.       Standard deltopectoral  approach was performed with a #10 blade. We dissected down to the subcutaneous tissues and the cephalic vein was taken laterally with the deltoid. Clavipectoral fascia was incised in  line with the incision. Deep retractors were placed. The long of the biceps tendon was identified and there was significant tenosynovitis present.  Tenodesis was performed to the pectoralis tendon with #2 Ethibond. The remaining biceps was followed up into the rotator interval where it was released.   The subscapularis was taken down in a full thickness layer with capsule along the humeral neck extending inferiorly around the humeral head. We continued releasing the capsule directly off of the osteophytes inferiorly all the way around the corner. This allowed [place] to dislocate the humeral head.   The humeral head had evidence of severe osteoarthritic wear with full-thickness cartilage loss and exposed subchondral bone. There was significant flattening of the humeral head.   On the superior erosion of the glenoid we felt that even though the rotator cuff was mostly intact that he would likely have trouble with subscapularis repair and instability and that reverse was indicated.  There were osteophytes along the inferior humeral neck. The osteophytes were removed with an osteotome and a rongeur.  Osteophytes were removed with a rongeur and an osteotome and the anatomic neck was well visualized.     A humeral cutting guide was used extra medullary with a pin to help control version. The version was set at 20 of retroversion. Humeral osteotomy was performed with an oscillating saw. The head fragment was passed off the back table.  A cut protector plate was placed.  The subscapularis was again identified and immediately we took care to palpate the axillary nerve anteriorly and verify its position with gentle palpation as well as the tug test.  We then released the SGHL with bovie cautery prior to placing a curved mayo at the junction of the anterior glenoid well above the axillary nerve and bluntly dissecting the subscapularis from the capsule.  We then carefully protected the axillary nerve as we gently  released the inferior capsule to fully mobilize the subscapularis.  An anterior deltoid retractor was then placed as well as a small Hohmann retractor superiorly.   The glenoid was inspected and had evidence of severe osteoarthritic wear with full-thickness cartilage loss and exposed subchondral bone.   The remaining labrum was removed circumferentially taking great care not to disrupt the posterior capsule.   At this point we felt based on blueprint templating that a full wedge augment was necessary.  We began by using a full wedge guide to place our center pin as was templated.  We had good position of this pin and we proceeded with our starter center drill.  This allowed for [place] to use the 15 degree full wedge reamer obtaining circumferential witness marks and good bone preparation for ingrowth.  At this point we proceeded with our center drill and had an intact vault.  We then drilled our center screw to a length of 35 mm.    We selected a 6.5 mm x 35 mm screw and the full wedge baseplate which was placed in the same orientation as our reaming.  We double checked that we had good apposition of the base plate to bone and then proceeded to place 3 locking screws and one nonlocking screw as is typical.  Next a 42 mm glenosphere was selected and impacted onto the baseplate. The center screw was tightened.  We turned attention back to the  humeral side. The cut protector was removed.  We used the perform humeral sizing block to select the appropriate size which for this patient was a 4.  We then placed our center pin and reamed over it concentrically obtaining appropriate inset.  We then used our lateralizing chisel to prepare the lateral aspect of the humerus.  At that point we selected the appropriate implant trialing a 4.  Using this trial implant we trialed multiple polyethylene sizes settling on a 3 which provided good stability and range of motion without excess soft tissue tension. The offset was  dialed in to match the normal anatomy. The shoulder was trialed.  There was good ROM in all planes and the shoulder was stable with no inferior translation.  The real humeral implants were opened after again confirming sizes.  The trial was removed. #5 Fiberwire x2 sutures passed through the humeral neck for subscap repair.  The subscapularis was quite contracted and we are only able to repair the inferior portion.  The humeral component was press-fit obtaining a secure fit. The joint was reduced and thoroughly irrigated with pulsatile lavage. Subscap was repaired back with #5 Fiberwire sutures through bone tunnels. Hemostasis was obtained. The deltopectoral interval was reapproximated with #1 Ethibond. The subcutaneous tissues were closed with 2-0 Vicryl and the skin was closed with running monocryl.    The wounds were cleaned and dried and an Aquacel dressing was placed. The drapes taken down. The arm was placed into sling with abduction pillow. Patient was awakened, extubated, and transferred to the recovery room in stable condition. There were no intraoperative complications. The sponge, needle, and attention counts were correct at the end of the case.     Noemi Chapel, PA-C, present and scrubbed throughout the case, critical for completion in a timely fashion, and for retraction, instrumentation, closure.

## 2022-05-31 NOTE — Anesthesia Procedure Notes (Addendum)
Anesthesia Regional Block: Interscalene brachial plexus block   Pre-Anesthetic Checklist: , timeout performed,  Correct Patient, Correct Site, Correct Laterality,  Correct Procedure, Correct Position, site marked,  Risks and benefits discussed,  Pre-op evaluation,  At surgeon's request and post-op pain management  Laterality: Right  Prep: Maximum Sterile Barrier Precautions used, chloraprep       Needles:  Injection technique: Single-shot  Needle Type: Echogenic Stimulator Needle     Needle Length: 4cm  Needle Gauge: 22     Additional Needles:   Procedures:,,,, ultrasound used (permanent image in chart),,    Narrative:  Start time: 05/31/2022 8:00 AM End time: 05/31/2022 8:03 AM Injection made incrementally with aspirations every 5 mL.  Performed by: Personally  Anesthesiologist: Brennan Bailey, MD  Additional Notes: Risks, benefits, and alternative discussed. Patient gave consent for procedure. Patient prepped and draped in sterile fashion. Sedation administered, patient remains easily responsive to voice. Relevant anatomy identified with ultrasound guidance. Local anesthetic given in 5cc increments with no signs or symptoms of intravascular injection. No pain or paraesthesias with injection. Patient monitored throughout procedure with signs of LAST or immediate complications. Tolerated well. Ultrasound image placed in chart.  Tawny Asal, MD

## 2022-05-31 NOTE — Anesthesia Postprocedure Evaluation (Signed)
Anesthesia Post Note  Patient: Paul Lowe  Procedure(s) Performed: REVERSE SHOULDER ARTHROPLASTY (Right: Shoulder)     Patient location during evaluation: PACU Anesthesia Type: General Level of consciousness: awake and alert Pain management: pain level controlled Vital Signs Assessment: post-procedure vital signs reviewed and stable Respiratory status: spontaneous breathing, nonlabored ventilation and respiratory function stable Cardiovascular status: blood pressure returned to baseline Postop Assessment: no apparent nausea or vomiting Anesthetic complications: no   No notable events documented.  Last Vitals:  Vitals:   05/31/22 1015 05/31/22 1030  BP: 99/79 104/79  Pulse: 65 66  Resp: 16 18  Temp:    SpO2: 96% 94%    Last Pain:  Vitals:   05/31/22 1015  TempSrc:   PainSc: 0-No pain                 Marthenia Rolling

## 2022-05-31 NOTE — Progress Notes (Addendum)
Assisted Dr. Howze with right, interscalene , ultrasound guided block. Side rails up, monitors on throughout procedure. See vital signs in flow sheet. Tolerated Procedure well. 

## 2022-06-01 ENCOUNTER — Encounter (HOSPITAL_BASED_OUTPATIENT_CLINIC_OR_DEPARTMENT_OTHER): Payer: Self-pay | Admitting: Orthopaedic Surgery

## 2022-06-04 DIAGNOSIS — Z96611 Presence of right artificial shoulder joint: Secondary | ICD-10-CM | POA: Diagnosis not present

## 2022-06-04 DIAGNOSIS — M6281 Muscle weakness (generalized): Secondary | ICD-10-CM | POA: Diagnosis not present

## 2022-06-05 DIAGNOSIS — M19011 Primary osteoarthritis, right shoulder: Secondary | ICD-10-CM | POA: Diagnosis not present

## 2022-06-07 DIAGNOSIS — M6281 Muscle weakness (generalized): Secondary | ICD-10-CM | POA: Diagnosis not present

## 2022-06-07 DIAGNOSIS — Z96611 Presence of right artificial shoulder joint: Secondary | ICD-10-CM | POA: Diagnosis not present

## 2022-06-12 DIAGNOSIS — M6281 Muscle weakness (generalized): Secondary | ICD-10-CM | POA: Diagnosis not present

## 2022-06-12 DIAGNOSIS — Z96611 Presence of right artificial shoulder joint: Secondary | ICD-10-CM | POA: Diagnosis not present

## 2022-06-14 DIAGNOSIS — M6281 Muscle weakness (generalized): Secondary | ICD-10-CM | POA: Diagnosis not present

## 2022-06-14 DIAGNOSIS — Z96611 Presence of right artificial shoulder joint: Secondary | ICD-10-CM | POA: Diagnosis not present

## 2022-06-19 DIAGNOSIS — Z96611 Presence of right artificial shoulder joint: Secondary | ICD-10-CM | POA: Diagnosis not present

## 2022-06-19 DIAGNOSIS — M6281 Muscle weakness (generalized): Secondary | ICD-10-CM | POA: Diagnosis not present

## 2022-06-21 DIAGNOSIS — M6281 Muscle weakness (generalized): Secondary | ICD-10-CM | POA: Diagnosis not present

## 2022-06-21 DIAGNOSIS — Z96611 Presence of right artificial shoulder joint: Secondary | ICD-10-CM | POA: Diagnosis not present

## 2022-06-22 ENCOUNTER — Ambulatory Visit (INDEPENDENT_AMBULATORY_CARE_PROVIDER_SITE_OTHER): Payer: PPO

## 2022-06-22 VITALS — Ht 69.0 in | Wt 190.0 lb

## 2022-06-22 DIAGNOSIS — Z0000 Encounter for general adult medical examination without abnormal findings: Secondary | ICD-10-CM | POA: Diagnosis not present

## 2022-06-22 NOTE — Progress Notes (Signed)
Subjective:   Paul Lowe is a 81 y.o. male who presents for Medicare Annual/Subsequent preventive examination.  Review of Systems    Virtual Visit via Telephone Note  I connected with  Paul Lowe on 06/22/22 at  9:15 AM EDT by telephone and verified that I am speaking with the correct person using two identifiers.  Location: Patient: Home Provider: Office Persons participating in the virtual visit: patient/Nurse Health Advisor   I discussed the limitations, risks, security and privacy concerns of performing an evaluation and management service by telephone and the availability of in person appointments. The patient expressed understanding and agreed to proceed.  Interactive audio and video telecommunications were attempted between this nurse and patient, however failed, due to patient having technical difficulties OR patient did not have access to video capability.  We continued and completed visit with audio only.  Some vital signs may be absent or patient reported.   Criselda Peaches, LPN  Cardiac Risk Factors include: advanced age (>31mn, >>56women);hypertension;male gender     Objective:    Today's Vitals   06/22/22 0906  Weight: 190 lb (86.2 kg)  Height: '5\' 9"'$  (1.753 m)   Body mass index is 28.06 kg/m.     06/22/2022    9:14 AM 05/31/2022    6:47 AM 05/24/2022    1:05 PM 06/21/2021    2:06 PM 08/03/2020   10:41 AM 03/04/2018    3:16 PM 05/23/2017   10:28 AM  Advanced Directives  Does Patient Have a Medical Advance Directive? Yes No No Yes Yes Yes Yes  Type of AParamed[place]of [place] will   [place] Lun[place]Living will [place] will  [place] will  Does patient want to make changes to medical advance directive?     No - Patient declined    Copy of [place] Chart? No - copy requested   No - copy requested No - copy requested    Would  patient like information on creating a medical advance directive?  No - Patient declined No - Patient declined        Current Medications (verified) Outpatient Encounter Medications as of 06/22/2022  Medication Sig   Ascorbic Acid (VITAMIN C) 100 MG tablet Take 500 mg by mouth daily.   aspirin (ASPIRIN CHILDRENS) 81 MG chewable tablet Chew 1 tablet (81 mg total) by mouth 2 (two) times daily. For 6 weeks for DVT prophylaxis after surgery   fish oil-omega-3 fatty acids 1000 MG capsule Take 1 g by mouth daily.    furosemide (LASIX) 20 MG tablet TAKE ONE TABLET BY MOUTH EVERY DAY   lisinopril (ZESTRIL) 10 MG tablet Take 1 tablet (10 mg total) by mouth daily.   loratadine (CLARITIN) 10 MG tablet Take 10 mg by mouth daily.   meloxicam (MOBIC) 15 MG tablet Take 1 tablet (15 mg total) by mouth daily. For 2 weeks for pain and inflammation. Then take as needed   Multiple Vitamin tablet Take 1 a day   omeprazole (PRILOSEC) 20 MG capsule Take 1 capsule (20 mg total) by mouth daily. To gastric protection while taking NSAIDs   simvastatin (ZOCOR) 20 MG tablet Take 1 tablet (20 mg total) by mouth daily at 6 PM.   No facility-administered encounter medications on file as of 06/22/2022.    Allergies (verified) Shrimp flavor   History: Past Medical History:  Diagnosis Date   Actinic keratosis  Adult acne    Allergic rhinitis    Asthma    Cataracts, bilateral    Hyperlipidemia    Hypertension    Idiopathic parathyroidism (HCC)    Osteoarthritis    Seasonal allergies    Seizure ([place]) 1990's   no seizure activity since   Past Surgical History:  Procedure Laterality Date   anal cyst     APPENDECTOMY     CATARACT EXTRACTION, BILATERAL     PARATHYROIDECTOMY N/A 03/10/2015   Procedure: PARATHYROIDECTOMY;  Surgeon: Armandina Gemma, MD;  Location: [facility];  Service: General;  Laterality: N/A;   REPLACEMENT TOTAL KNEE Bilateral    REVERSE SHOULDER ARTHROPLASTY Right 05/31/2022   Procedure: REVERSE SHOULDER  ARTHROPLASTY;  Surgeon: Hiram Gash, MD;  Location: [facility];  Service: Orthopedics;  Laterality: Right;   Family History  Problem Relation Age of Onset   Stroke Mother    Colon cancer Neg Hx    Esophageal cancer Neg Hx    Rectal cancer Neg Hx    Stomach cancer Neg Hx    Social History   Socioeconomic History   Marital status: Married    Spouse name: Not on file   Number of children: Not on file   Years of education: Not on file   Highest education level: Not on file  Occupational History   Not on file  Tobacco Use   Smoking status: Never   Smokeless tobacco: Never  Vaping Use   Vaping Use: Never used  Substance and Sexual Activity   Alcohol use: Not Currently    Comment: occasionally   Drug use: No   Sexual activity: Not on file  Other Topics Concern   Not on file  Social History Narrative   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   Married    No kids       He likes to play tennis    Social Determinants of Radio broadcast assistant Strain: Low Risk  (06/22/2022)   Overall Financial Resource Strain (CARDIA)    Difficulty of Paying Living Expenses: Not hard at all  Food Insecurity: No Food Insecurity (06/22/2022)   Hunger Vital Sign    Worried About Running Out of Food in the Last Year: Never true    [place] in the Last Year: Never true  Transportation Needs: No Transportation Needs (06/21/2021)   PRAPARE - Hydrologist (Medical): No    Lack of Transportation (Non-Medical): No  Physical Activity: Sufficiently Active (06/22/2022)   Exercise Vital Sign    Days of Exercise per Week: 4 days    Minutes of Exercise per Session: 40 min  Stress: No Stress Concern Present (06/22/2022)   [place]    Feeling of Stress : Not at all  Social Connections: [place] (06/22/2022)   Social Connection and Isolation Panel [NHANES]    Frequency of  Communication with Friends and Family: More than three times a week    Frequency of Social Gatherings with Friends and Family: More than three times a week    Attends Religious Services: More than 4 times per year    Active Member of [employer] or Organizations: Yes    Attends Music therapist: More than 4 times per year    Marital Status: Married    Tobacco Counseling Counseling given: Not Answered   Clinical Intake:  Pre-visit preparation completed: No  Pain : No/denies pain     BMI - recorded: 28.27 Nutritional Status: BMI 25 -29 Overweight Nutritional Risks: None Diabetes: No  How often do you need to have someone help you when you read instructions, pamphlets, or other written materials from your doctor or pharmacy?: 1 - Never  Diabetic?  No  Interpreter Needed?: No  Information entered by :: Rolene Arbour LPN   Activities of Daily Living    06/22/2022    9:13 AM 05/31/2022    6:52 AM  In your present state of health, do you have any difficulty performing the following activities:  Hearing? 0 0  Vision? 0 0  Difficulty concentrating or making decisions? 0 0  Walking or climbing stairs? 0 0  Dressing or bathing? 0 0  Doing errands, shopping? 0   Preparing Food and eating ? N   Using the Toilet? N   In the past six months, have you accidently leaked urine? N   Do you have problems with loss of bowel control? N   Managing your Medications? N   Managing your Finances? N   Housekeeping or managing your Housekeeping? N     Patient Care Team: [name], NP as PCP - General (Family Medicine) Viona Gilmore, [facility] as Pharmacist (Pharmacist)  Indicate any recent Medical Services you may have received from other than [facility] providers in the past year (date may be approximate).     Assessment:   This is a routine wellness examination for Deren.  Hearing/Vision screen Hearing Screening - Comments:: Denies hearing difficulties   Vision Screening -  Comments:: Wears reading glasses - up to date with routine eye exams with [facility]    Dietary issues and exercise activities discussed: Current Exercise Habits: Home exercise routine, Type of exercise: walking, Time (Minutes): 40, Frequency (Times/Week): 4, Weekly Exercise (Minutes/Week): 160, Intensity: Moderate, Exercise limited by: None identified   Goals Addressed               This Visit's Progress     Patient stated (pt-stated)        Finish rehab for shoulder.       Depression Screen    09/22/2021   11:15 AM 08/16/2021    9:02 AM 06/21/2021    2:07 PM 06/21/2021    2:04 PM 08/12/2020    8:11 AM 08/03/2020   10:44 AM 03/04/2018    3:23 PM  PHQ 2/9 Scores  PHQ - 2 Score 1 0 0 0 0 0 0  PHQ- 9 Score 3    0 0     Fall Risk    06/22/2022    9:13 AM 08/16/2021    9:01 AM 06/21/2021    2:07 PM 08/03/2020   10:42 AM 05/10/2020    2:46 PM  [place] in the past year? 1 0 0 0 0  Comment     Emmi Telephone Survey: data to providers prior to load  Number falls in past yr: 0 0 0 0   Injury with Fall? 0 0 0 0   Risk for fall due to : No Fall Risks  No Fall Risks No Fall Risks   Follow up   Falls evaluation completed Falls evaluation completed;Falls prevention discussed     FALL RISK PREVENTION PERTAINING TO THE HOME:  Any stairs in or around the home? Yes  If so, are there any without handrails? No  Home free of loose throw rugs in walkways, pet  beds, electrical cords, etc? Yes  Adequate lighting in your home to reduce risk of falls? Yes   ASSISTIVE DEVICES UTILIZED TO PREVENT FALLS:  Life alert? No  Use of a cane, walker or w/c? No  Grab bars in the bathroom? Yes  Shower chair or bench in shower? No  Elevated toilet seat or a handicapped toilet? Yes   TIMED UP AND GO:  Was the test performed? No . Audio Visit   Cognitive Function:        06/22/2022    9:14 AM 08/03/2020   10:47 AM  6CIT Screen  What Year? 0 points 0 points  What month? 0 points 0  points  What time? 0 points 0 points  Count back from 20 0 points 0 points  Months in reverse 0 points 0 points  Repeat phrase 0 points 0 points  Total Score 0 points 0 points    Immunizations Immunization History  Administered Date(s) Administered   Fluad Quad(high Dose 65+) 08/12/2020   Hepatitis A 06/29/2008   Influenza Split 07/19/2011, 07/11/2012   Influenza Whole 07/07/2008, 08/24/2009, 07/21/2010   Influenza, High Dose Seasonal PF 07/19/2015, 07/03/2018, 06/14/2019   Influenza,inj,Quad PF,6+ Mos 07/03/2013, 07/09/2014   Influenza-Unspecified 07/10/2016   PFIZER(Purple Top)SARS-COV-2 Vaccination 10/30/2019, 11/18/2019, 09/01/2020   Pneumococcal Conjugate-13 09/29/2013, 09/30/2014   Pneumococcal Polysaccharide-23 05/24/2009   Td 10/15/2004   Tdap 12/19/2015   Zoster, Live 07/21/2010    TDAP status: Up to date  Flu Vaccine status: Declined, Education has been provided regarding the importance of this vaccine but patient still declined. Advised may receive this vaccine at local pharmacy or Health Dept. Aware to provide a copy of the vaccination record if obtained from local pharmacy or Health Dept. Verbalized acceptance and understanding.  Pneumococcal vaccine status: Up to date  Covid-19 vaccine status: Completed vaccines  Qualifies for Shingles Vaccine? Yes   Zostavax completed No   Shingrix Completed?: No.    Education has been provided regarding the importance of this vaccine. Patient has been advised to call insurance company to determine out of pocket expense if they have not yet received this vaccine. Advised may also receive vaccine at local pharmacy or Health Dept. Verbalized acceptance and understanding.  Screening Tests Health Maintenance  Topic Date Due   COVID-19 Vaccine (4 - Pfizer risk series) 07/08/2022 (Originally 10/27/2020)   Zoster Vaccines- Shingrix (1 of 2) 09/21/2022 (Originally 11/09/1959)   INFLUENZA VACCINE  01/13/2023 (Originally 05/15/2022)    TETANUS/TDAP  12/18/2025   Pneumonia Vaccine 60+ Years old  Completed   Hepatitis C Screening  Completed   HPV VACCINES  Aged Out    Health Maintenance  There are no preventive care reminders to display for this patient.   Colorectal cancer screening: No longer required.   Lung Cancer Screening: (Low Dose CT Chest recommended if Age 6-80 years, 30 pack-year currently smoking OR have quit w/in 15years.) does not qualify.     Additional Screening:  Hepatitis C Screening: does qualify; Completed 08/15/20  Vision Screening: Recommended annual ophthalmology exams for early detection of glaucoma and other disorders of the eye. Is the patient up to date with their annual eye exam?  Yes  Who is the provider or what is the name of the office in which the patient attends annual eye exams? [facility] If pt is not established with a provider, would they like to be referred to a provider to establish care? No .   Dental Screening: Recommended annual dental exams  for proper oral hygiene  Community Resource Referral / Chronic Care Management:  CRR required this visit?  No   CCM required this visit?  No      Plan:     I have personally reviewed and noted the following in the patient's chart:   Medical and social history Use of alcohol, tobacco or illicit drugs  Current medications and supplements including opioid prescriptions. Patient is not currently taking opioid prescriptions. Functional ability and status Nutritional status Physical activity Advanced directives List of other physicians Hospitalizations, surgeries, and ER visits in previous 12 months Vitals Screenings to include cognitive, depression, and falls Referrals and appointments  In addition, I have reviewed and discussed with patient certain preventive protocols, quality metrics, and best practice recommendations. A written personalized care plan for preventive services as well as general preventive health  recommendations were provided to patient.     Criselda Peaches, LPN   [birth year]   Nurse Notes: None

## 2022-06-22 NOTE — Patient Instructions (Addendum)
[name] , Thank you for taking time to come for your Medicare Wellness Visit. I appreciate your ongoing commitment to your health goals. Please review the following plan we discussed and let me know if I can assist you in the future.   Screening recommendations/referrals: Colonoscopy: No longer required Recommended yearly ophthalmology/optometry visit for glaucoma screening and checkup Recommended yearly dental visit for hygiene and checkup  Vaccinations: Influenza vaccine: Deferred Pneumococcal vaccine: Up to date Tdap vaccine: Uo to date Shingles vaccine: Deferred   Covid-19: Done  Advanced directives: Please bring a copy of your health care power of attorney and living will to the office to be added to your chart at your convenience.   Conditions/risks identified: None  Next appointment: Follow up in one year for your annual wellness visit.    Preventive Care 79 Years and Older, Male  Preventive care refers to lifestyle choices and visits with your health care provider that can promote health and wellness. What does preventive care include? A yearly physical exam. This is also called an annual well check. Dental exams once or twice a year. Routine eye exams. Ask your health care provider how often you should have your eyes checked. Personal lifestyle choices, including: Daily care of your teeth and gums. Regular physical activity. Eating a healthy diet. Avoiding tobacco and drug use. Limiting alcohol use. Practicing safe sex. Taking low doses of aspirin every day. Taking vitamin and mineral supplements as recommended by your health care provider. What happens during an annual well check? The services and screenings done by your health care provider during your annual well check will depend on your age, overall health, lifestyle risk factors, and family history of disease. Counseling  Your health care provider may ask you questions about your: Alcohol use. Tobacco  use. Drug use. Emotional well-being. Home and relationship well-being. Sexual activity. Eating habits. History of falls. Memory and ability to understand (cognition). Work and work Statistician. Screening  You may have the following tests or measurements: Height, weight, and BMI. Blood pressure. Lipid and cholesterol levels. These may be checked every 5 years, or more frequently if you are over 79 years old. Skin check. Lung cancer screening. You may have this screening every year starting at age 79 if you have a 30-pack-year history of smoking and currently smoke or have quit within the past 15 years. Fecal occult blood test (FOBT) of the stool. You may have this test every year starting at age 79. Flexible sigmoidoscopy or colonoscopy. You may have a sigmoidoscopy every 5 years or a colonoscopy every 10 years starting at age 79. Prostate cancer screening. Recommendations will vary depending on your family history and other risks. Hepatitis C blood test. Hepatitis B blood test. Sexually transmitted disease (STD) testing. Diabetes screening. This is done by checking your blood sugar (glucose) after you have not eaten for a while (fasting). You may have this done every 1-3 years. Abdominal aortic aneurysm (AAA) screening. You may need this if you are a current or former smoker. Osteoporosis. You may be screened starting at age 79 if you are at high risk. Talk with your health care provider about your test results, treatment options, and if necessary, the need for more tests. Vaccines  Your health care provider may recommend certain vaccines, such as: Influenza vaccine. This is recommended every year. Tetanus, diphtheria, and acellular pertussis (Tdap, Td) vaccine. You may need a Td booster every 10 years. Zoster vaccine. You may need this after age 79. Pneumococcal  13-valent conjugate (PCV13) vaccine. One dose is recommended after age 79. Pneumococcal polysaccharide (PPSV23) vaccine.  One dose is recommended after age 44. Talk to your health care provider about which screenings and vaccines you need and how often you need them. This information is not intended to replace advice given to you by your health care provider. Make sure you discuss any questions you have with your health care provider. Document Released: 10/28/2015 Document Revised: 06/20/2016 Document Reviewed: 08/02/2015 Elsevier Interactive Patient Education  2017 [place] Prevention in the Home Falls can cause injuries. They can happen to people of all ages. There are many things you can do to make your home safe and to help prevent falls. What can I do on the outside of my home? Regularly fix the edges of walkways and driveways and fix any cracks. Remove anything that might make you trip as you walk through a door, such as a raised step or threshold. Trim any bushes or trees on the path to your home. Use bright outdoor lighting. Clear any walking paths of anything that might make someone trip, such as rocks or tools. Regularly check to see if handrails are loose or broken. Make sure that both sides of any steps have handrails. Any raised decks and porches should have guardrails on the edges. Have any leaves, snow, or ice cleared regularly. Use sand or salt on walking paths during winter. Clean up any spills in your garage right away. This includes oil or grease spills. What can I do in the bathroom? Use night lights. Install grab bars by the toilet and in the tub and shower. Do not use towel bars as grab bars. Use non-skid mats or decals in the tub or shower. If you need to sit down in the shower, use a plastic, non-slip stool. Keep the floor dry. Clean up any water that spills on the floor as soon as it happens. Remove soap buildup in the tub or shower regularly. Attach bath mats securely with double-sided non-slip rug tape. Do not have throw rugs and other things on the floor that can make  you trip. What can I do in the bedroom? Use night lights. Make sure that you have a light by your bed that is easy to reach. Do not use any sheets or blankets that are too big for your bed. They should not hang down onto the floor. Have a firm chair that has side arms. You can use this for support while you get dressed. Do not have throw rugs and other things on the floor that can make you trip. What can I do in the kitchen? Clean up any spills right away. Avoid walking on wet floors. Keep items that you use a lot in easy-to-reach places. If you need to reach something above you, use a strong step stool that has a grab bar. Keep electrical cords out of the way. Do not use floor polish or wax that makes floors slippery. If you must use wax, use non-skid floor wax. Do not have throw rugs and other things on the floor that can make you trip. What can I do with my stairs? Do not leave any items on the stairs. Make sure that there are handrails on both sides of the stairs and use them. Fix handrails that are broken or loose. Make sure that handrails are as long as the stairways. Check any carpeting to make sure that it is firmly attached to the stairs. Fix any carpet  that is loose or worn. Avoid having throw rugs at the top or bottom of the stairs. If you do have throw rugs, attach them to the floor with carpet tape. Make sure that you have a light switch at the top of the stairs and the bottom of the stairs. If you do not have them, ask someone to add them for you. What else can I do to help prevent falls? Wear shoes that: Do not have high heels. Have rubber bottoms. Are comfortable and fit you well. Are closed at the toe. Do not wear sandals. If you use a stepladder: Make sure that it is fully opened. Do not climb a closed stepladder. Make sure that both sides of the stepladder are locked into place. Ask someone to hold it for you, if possible. Clearly mark and make sure that you can  see: Any grab bars or handrails. First and last steps. Where the edge of each step is. Use tools that help you move around (mobility aids) if they are needed. These include: Canes. Walkers. Scooters. Crutches. Turn on the lights when you go into a dark area. Replace any light bulbs as soon as they burn out. Set up your furniture so you have a clear path. Avoid moving your furniture around. If any of your floors are uneven, fix them. If there are any pets around you, be aware of where they are. Review your medicines with your doctor. Some medicines can make you feel dizzy. This can increase your chance of falling. Ask your doctor what other things that you can do to help prevent falls. This information is not intended to replace advice given to you by your health care provider. Make sure you discuss any questions you have with your health care provider. Document Released: 07/28/2009 Document Revised: 03/08/2016 Document Reviewed: 11/05/2014 Elsevier Interactive Patient Education  2017 [employer].

## 2022-06-26 DIAGNOSIS — Z96611 Presence of right artificial shoulder joint: Secondary | ICD-10-CM | POA: Diagnosis not present

## 2022-06-26 DIAGNOSIS — M19011 Primary osteoarthritis, right shoulder: Secondary | ICD-10-CM | POA: Diagnosis not present

## 2022-06-26 DIAGNOSIS — M6281 Muscle weakness (generalized): Secondary | ICD-10-CM | POA: Diagnosis not present

## 2022-06-28 DIAGNOSIS — Z96611 Presence of right artificial shoulder joint: Secondary | ICD-10-CM | POA: Diagnosis not present

## 2022-06-28 DIAGNOSIS — M6281 Muscle weakness (generalized): Secondary | ICD-10-CM | POA: Diagnosis not present

## 2022-07-03 ENCOUNTER — Telehealth (INDEPENDENT_AMBULATORY_CARE_PROVIDER_SITE_OTHER): Payer: PPO | Admitting: Adult Health

## 2022-07-03 DIAGNOSIS — U071 COVID-19: Secondary | ICD-10-CM

## 2022-07-03 MED ORDER — MOLNUPIRAVIR EUA 200MG CAPSULE: 4.0000 | ORAL_CAPSULE | Freq: Two times a day (BID) | ORAL | 0 refills | Status: AC

## 2022-07-03 NOTE — Progress Notes (Signed)
Virtual Visit via Telephone Note  I connected with Paul Lowe on 07/03/22 at 11:30 AM EDT by telephone and verified that I am speaking with the correct person using two identifiers.   I discussed the limitations, risks, security and privacy concerns of performing an evaluation and management service by telephone and the availability of in person appointments. I also discussed with the patient that there may be a patient responsible charge related to this service. The patient expressed understanding and agreed to proceed.  Location patient: home Location provider: work or home office Participants present for the call: patient, provider Patient did not have a visit in the prior 7 days to address this/these issue(s).   History of Present Illness: 81 year old male who  has a past medical history of Actinic keratosis, Adult acne, Allergic rhinitis, Asthma, Cataracts, bilateral, Hyperlipidemia, Hypertension, Idiopathic parathyroidism ([place]), Osteoarthritis, Seasonal allergies, and Seizure ([place]) (1990's).  He is being evaluated today for positive Covid 19 infection. His wife tested positive for Covid last week. His symptoms started a day ago and he tested positive this morning. Symptoms include a scratchy throat, body aches, chills, and fever up to 100.   He is interested in antiviral therapy    Observations/Objective: Patient sounds cheerful and well on the phone. I do not appreciate any SOB. Speech and thought processing are grossly intact. Patient reported vitals:  Assessment and Plan: 1. COVID-19  - molnupiravir EUA (LAGEVRIO) 200 mg CAPS capsule; Take 4 capsules (800 mg total) by mouth 2 (two) times daily for 5 days.  Dispense: 40 capsule; Refill: 0 - rest and hydrated. Follow up in 2-3 days if not improving or sooner if symptoms worsen   Follow Up Instructions:    I did not refer this patient for an OV in the next 24 hours for this/these issue(s).  I discussed the assessment  and treatment plan with the patient. The patient was provided an opportunity to ask questions and all were answered. The patient agreed with the plan and demonstrated an understanding of the instructions.   The patient was advised to call back or seek an in-person evaluation if the symptoms worsen or if the condition fails to improve as anticipated.  I provided 12 minutes of non-face-to-face time during this encounter.   [name], NP

## 2022-07-10 ENCOUNTER — Encounter: Payer: Self-pay | Admitting: Adult Health

## 2022-07-17 DIAGNOSIS — M6281 Muscle weakness (generalized): Secondary | ICD-10-CM | POA: Diagnosis not present

## 2022-07-17 DIAGNOSIS — Z96611 Presence of right artificial shoulder joint: Secondary | ICD-10-CM | POA: Diagnosis not present

## 2022-07-19 DIAGNOSIS — M6281 Muscle weakness (generalized): Secondary | ICD-10-CM | POA: Diagnosis not present

## 2022-07-19 DIAGNOSIS — Z96611 Presence of right artificial shoulder joint: Secondary | ICD-10-CM | POA: Diagnosis not present

## 2022-07-23 DIAGNOSIS — Z96611 Presence of right artificial shoulder joint: Secondary | ICD-10-CM | POA: Diagnosis not present

## 2022-07-23 DIAGNOSIS — M6281 Muscle weakness (generalized): Secondary | ICD-10-CM | POA: Diagnosis not present

## 2022-07-24 ENCOUNTER — Other Ambulatory Visit: Payer: Self-pay | Admitting: Adult Health

## 2022-07-24 ENCOUNTER — Encounter: Payer: Self-pay | Admitting: Adult Health

## 2022-07-25 ENCOUNTER — Other Ambulatory Visit: Payer: Self-pay | Admitting: Adult Health

## 2022-07-30 DIAGNOSIS — Z96611 Presence of right artificial shoulder joint: Secondary | ICD-10-CM | POA: Diagnosis not present

## 2022-07-30 DIAGNOSIS — M6281 Muscle weakness (generalized): Secondary | ICD-10-CM | POA: Diagnosis not present

## 2022-08-03 DIAGNOSIS — M6281 Muscle weakness (generalized): Secondary | ICD-10-CM | POA: Diagnosis not present

## 2022-08-03 DIAGNOSIS — Z96611 Presence of right artificial shoulder joint: Secondary | ICD-10-CM | POA: Diagnosis not present

## 2022-08-06 DIAGNOSIS — M6281 Muscle weakness (generalized): Secondary | ICD-10-CM | POA: Diagnosis not present

## 2022-08-06 DIAGNOSIS — Z96611 Presence of right artificial shoulder joint: Secondary | ICD-10-CM | POA: Diagnosis not present

## 2022-08-10 DIAGNOSIS — M6281 Muscle weakness (generalized): Secondary | ICD-10-CM | POA: Diagnosis not present

## 2022-08-10 DIAGNOSIS — Z96611 Presence of right artificial shoulder joint: Secondary | ICD-10-CM | POA: Diagnosis not present

## 2022-08-13 DIAGNOSIS — M6281 Muscle weakness (generalized): Secondary | ICD-10-CM | POA: Diagnosis not present

## 2022-08-13 DIAGNOSIS — Z96611 Presence of right artificial shoulder joint: Secondary | ICD-10-CM | POA: Diagnosis not present

## 2022-08-16 DIAGNOSIS — M6281 Muscle weakness (generalized): Secondary | ICD-10-CM | POA: Diagnosis not present

## 2022-08-16 DIAGNOSIS — Z96611 Presence of right artificial shoulder joint: Secondary | ICD-10-CM | POA: Diagnosis not present

## 2022-08-19 ENCOUNTER — Other Ambulatory Visit: Payer: Self-pay | Admitting: Adult Health

## 2022-08-19 DIAGNOSIS — I10 Essential (primary) hypertension: Secondary | ICD-10-CM

## 2022-08-20 DIAGNOSIS — Z96611 Presence of right artificial shoulder joint: Secondary | ICD-10-CM | POA: Diagnosis not present

## 2022-08-20 DIAGNOSIS — M6281 Muscle weakness (generalized): Secondary | ICD-10-CM | POA: Diagnosis not present

## 2022-08-24 DIAGNOSIS — M6281 Muscle weakness (generalized): Secondary | ICD-10-CM | POA: Diagnosis not present

## 2022-08-24 DIAGNOSIS — Z96611 Presence of right artificial shoulder joint: Secondary | ICD-10-CM | POA: Diagnosis not present

## 2022-08-28 DIAGNOSIS — M19011 Primary osteoarthritis, right shoulder: Secondary | ICD-10-CM | POA: Diagnosis not present

## 2022-08-29 ENCOUNTER — Encounter: Payer: PPO | Admitting: Adult Health

## 2022-08-30 ENCOUNTER — Ambulatory Visit (INDEPENDENT_AMBULATORY_CARE_PROVIDER_SITE_OTHER): Payer: PPO | Admitting: Adult Health

## 2022-08-30 ENCOUNTER — Encounter: Payer: Self-pay | Admitting: Adult Health

## 2022-08-30 VITALS — BP 140/100 | Temp 97.8°F | Ht 69.5 in | Wt 189.0 lb

## 2022-08-30 DIAGNOSIS — Z0000 Encounter for general adult medical examination without abnormal findings: Secondary | ICD-10-CM | POA: Diagnosis not present

## 2022-08-30 DIAGNOSIS — M1990 Unspecified osteoarthritis, unspecified site: Secondary | ICD-10-CM | POA: Diagnosis not present

## 2022-08-30 DIAGNOSIS — E210 Primary hyperparathyroidism: Secondary | ICD-10-CM

## 2022-08-30 DIAGNOSIS — I10 Essential (primary) hypertension: Secondary | ICD-10-CM

## 2022-08-30 DIAGNOSIS — E782 Mixed hyperlipidemia: Secondary | ICD-10-CM | POA: Diagnosis not present

## 2022-08-30 LAB — COMPREHENSIVE METABOLIC PANEL
ALT: 18 U/L (ref 0–53)
AST: 24 U/L (ref 0–37)
Albumin: 4.3 g/dL (ref 3.5–5.2)
Alkaline Phosphatase: 68 U/L (ref 39–117)
BUN: 19 mg/dL (ref 6–23)
CO2: 28 mEq/L (ref 19–32)
Calcium: 9.4 mg/dL (ref 8.4–10.5)
Chloride: 104 mEq/L (ref 96–112)
Creatinine, Ser: 1.24 mg/dL (ref 0.40–1.50)
GFR: 55.18 mL/min — ABNORMAL LOW (ref >60.00)
Glucose, Bld: 94 mg/dL (ref 70–99)
Potassium: 5.1 mEq/L (ref 3.5–5.1)
Sodium: 138 mEq/L (ref 135–145)
Total Bilirubin: 0.6 mg/dL (ref 0.2–1.2)
Total Protein: 7.2 g/dL (ref 6.0–8.3)

## 2022-08-30 LAB — CBC WITH DIFFERENTIAL/PLATELET
Basophils Absolute: 0.1 10*3/uL (ref 0.0–0.1)
Basophils Relative: 1.2 % (ref 0.0–3.0)
Eosinophils Absolute: 0.2 10*3/uL (ref 0.0–0.7)
Eosinophils Relative: 3.3 % (ref 0.0–5.0)
HCT: 46.9 % (ref 39.0–52.0)
Hemoglobin: 15.8 g/dL (ref 13.0–17.0)
Lymphocytes Relative: 19.9 % (ref 12.0–46.0)
Lymphs Abs: 1.4 10*3/uL (ref 0.7–4.0)
MCHC: 33.8 g/dL (ref 30.0–36.0)
MCV: 97.3 fl (ref 78.0–100.0)
Monocytes Absolute: 0.6 10*3/uL (ref 0.1–1.0)
Monocytes Relative: 8.2 % (ref 3.0–12.0)
Neutro Abs: 4.9 10*3/uL (ref 1.4–7.7)
Neutrophils Relative %: 67.4 % (ref 43.0–77.0)
Platelets: 185 10*3/uL (ref 150.0–400.0)
RBC: 4.82 Mil/uL (ref 4.22–5.81)
RDW: 12.8 % (ref 11.5–15.5)
WBC: 7.2 10*3/uL (ref 4.0–10.5)

## 2022-08-30 LAB — TSH: TSH: 1.25 u[IU]/mL (ref 0.35–5.50)

## 2022-08-30 LAB — VITAMIN D 25 HYDROXY (VIT D DEFICIENCY, FRACTURES): VITD: 45.69 ng/mL (ref 30.00–100.00)

## 2022-08-30 NOTE — Patient Instructions (Signed)
It was great seeing you today   We will follow up with you regarding your lab work   Please let me know if you need anything   

## 2022-08-30 NOTE — Progress Notes (Signed)
Subjective:    Patient ID: Paul Lowe, male    DOB: 16-Aug-1941, 81 y.o.   MRN: [id number]  HPI Patient presents for yearly preventative medicine examination. He is a pleasant 81 year old male who  has a past medical history of Actinic keratosis, Adult acne, Allergic rhinitis, Asthma, Cataracts, bilateral, Hyperlipidemia, Hypertension, Idiopathic parathyroidism ([place]), Osteoarthritis, Seasonal allergies, and Seizure ([place]) (1990's).  Hyperlipidemia-currently managed with Zocor 20 mg daily.  He denies myalgia or fatigue Lab Results  Component Value Date   CHOL 122 08/16/2021   HDL 38.20 (L) 08/16/2021   LDLCALC 58 08/16/2021   TRIG 126.0 08/16/2021   CHOLHDL 3 08/16/2021   Hypertension-managed with lisinopril 10 mg daily and Lasix 20 mg daily.  He denies dizziness, lightheadedness, chest pain, or shortness of breath. He did not take his medication this morning.  BP Readings from Last 3 Encounters:  08/30/22 (!) 140/100  05/31/22 114/75  05/18/22 100/80   Osteoarthritis-managed with Mobic 15 mg daily as needed.  He does feel as though this patient adequately controls his pain  Rosacea -doxycycline 100 mg twice daily PRN   Hyperparathyroidism-had parathyroidectomy in May 2016.  He is currently not on any medications. Calcium levels within normal range.   All immunizations and health maintenance protocols were reviewed with the patient and needed orders were placed.  Appropriate screening laboratory values were ordered for the patient including screening of hyperlipidemia, renal function and hepatic function. If indicated by BPH, a PSA was ordered.  Medication reconciliation,  past medical history, social history, problem list and allergies were reviewed in detail with the patient  Goals were established with regard to weight loss, exercise, and  diet in compliance with medications Wt Readings from Last 3 Encounters:  08/30/22 189 lb (85.7 kg)  06/22/22 190 lb (86.2 kg)   05/31/22 189 lb 6 oz (85.9 kg)   Review of Systems  Constitutional: Negative.   HENT: Negative.    Eyes: Negative.   Respiratory: Negative.    Cardiovascular: Negative.   Gastrointestinal: Negative.   Endocrine: Negative.   Genitourinary: Negative.   Musculoskeletal:  Positive for arthralgias.  Skin: Negative.   Allergic/Immunologic: Negative.   Neurological: Negative.   Hematological: Negative.   Psychiatric/Behavioral: Negative.    All other systems reviewed and are negative.  Past Medical History:  Diagnosis Date   Actinic keratosis    Adult acne    Allergic rhinitis    Asthma    Cataracts, bilateral    Hyperlipidemia    Hypertension    Idiopathic parathyroidism ([facility])    Osteoarthritis    Seasonal allergies    Seizure ([facility]) 1990's   no seizure activity since    Social History   Socioeconomic History   Marital status: Married    Spouse name: Not on file   Number of children: Not on file   Years of education: Not on file   Highest education level: Not on file  Occupational History   Not on file  Tobacco Use   Smoking status: Never   Smokeless tobacco: Never  Vaping Use   Vaping Use: Never used  Substance and Sexual Activity   Alcohol use: Not Currently    Comment: occasionally   Drug use: No   Sexual activity: Not on file  Other Topics Concern   Not on file  Social History Narrative   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   Married    No kids  He likes to play tennis    Social Determinants of Health   Financial Resource Strain: Low Risk  (06/22/2022)   Overall Financial Resource Strain (CARDIA)    Difficulty of Paying Living Expenses: Not hard at all  Food Insecurity: No Food Insecurity (06/22/2022)   Hunger Vital Sign    Worried About Running Out of Food in the Last Year: Never true    Ran Out of Food in the Last Year: Never true  Transportation Needs: No Transportation Needs (06/21/2021)   PRAPARE - Radiographer, therapeutic (Medical): No    Lack of Transportation (Non-Medical): No  Physical Activity: Sufficiently Active (06/22/2022)   Exercise Vital Sign    Days of Exercise per Week: 4 days    Minutes of Exercise per Session: 40 min  Stress: No Stress Concern Present (06/22/2022)   [place]    Feeling of Stress : Not at all  Social Connections: [place] (06/22/2022)   Social Connection and Isolation Panel [NHANES]    Frequency of Communication with Friends and Family: More than three times a week    Frequency of Social Gatherings with Friends and Family: More than three times a week    Attends Religious Services: More than 4 times per year    Active Member of [employer] or Organizations: Yes    Attends Archivist Meetings: More than 4 times per year    Marital Status: Married  Human resources officer Violence: Not At Risk (06/22/2022)   Humiliation, Afraid, Rape, and Kick questionnaire    Fear of Current or Ex-Partner: No    Emotionally Abused: No    Physically Abused: No    Sexually Abused: No    Past Surgical History:  Procedure Laterality Date   anal cyst     APPENDECTOMY     CATARACT EXTRACTION, BILATERAL     PARATHYROIDECTOMY N/A 03/10/2015   Procedure: PARATHYROIDECTOMY;  Surgeon: Armandina Gemma, MD;  Location: [facility];  Service: General;  Laterality: N/A;   REPLACEMENT TOTAL KNEE Bilateral    REVERSE SHOULDER ARTHROPLASTY Right 05/31/2022   Procedure: REVERSE SHOULDER ARTHROPLASTY;  Surgeon: Hiram Gash, MD;  Location: [facility];  Service: Orthopedics;  Laterality: Right;    Family History  Problem Relation Age of Onset   Stroke Mother    Colon cancer Neg Hx    Esophageal cancer Neg Hx    Rectal cancer Neg Hx    Stomach cancer Neg Hx     Allergies  Allergen Reactions   Shrimp Flavor     REACTION: rash    Current Outpatient Medications on File Prior to Visit  Medication Sig Dispense  Refill   Ascorbic Acid (VITAMIN C) 100 MG tablet Take 500 mg by mouth daily.     fish oil-omega-3 fatty acids 1000 MG capsule Take 1 g by mouth daily.      furosemide (LASIX) 20 MG tablet TAKE ONE TABLET BY MOUTH EVERY DAY 270 tablet 0   lisinopril (ZESTRIL) 10 MG tablet TAKE 1 TABLET(10 MG) BY MOUTH DAILY 90 tablet 3   loratadine (CLARITIN) 10 MG tablet Take 10 mg by mouth daily.     meloxicam (MOBIC) 15 MG tablet Take 1 tablet (15 mg total) by mouth daily. For 2 weeks for pain and inflammation. Then take as needed 30 tablet 0   Multiple Vitamin tablet Take 1 a day 30 tablet 0   simvastatin (ZOCOR) 20 MG  tablet TAKE 1 TABLET(20 MG) BY MOUTH DAILY AT 6 PM 90 tablet 1   No current facility-administered medications on file prior to visit.    BP (!) 140/100   Temp 97.8 F (36.6 C) (Oral)   Ht 5' 9.5" (1.765 m)   Wt 189 lb (85.7 kg)   BMI 27.51 kg/m       Objective:   Physical Exam Vitals and nursing note reviewed.  Constitutional:      General: He is not in acute distress.    Appearance: Normal appearance. He is well-developed and normal weight.  HENT:     Head: Normocephalic and atraumatic.     Right Ear: Tympanic membrane, ear canal and external ear normal. There is no impacted cerumen.     Left Ear: Tympanic membrane, ear canal and external ear normal. There is no impacted cerumen.     Nose: Nose normal. No congestion or rhinorrhea.     Mouth/Throat:     Mouth: Mucous membranes are moist.     Pharynx: Oropharynx is clear. No oropharyngeal exudate or posterior oropharyngeal erythema.  Eyes:     General:        Right eye: No discharge.        Left eye: No discharge.     Extraocular Movements: Extraocular movements intact.     Conjunctiva/sclera: Conjunctivae normal.     Pupils: Pupils are equal, round, and reactive to light.  Neck:     Vascular: No carotid bruit.     Trachea: No tracheal deviation.  Cardiovascular:     Rate and Rhythm: Normal rate and regular rhythm.      Pulses: Normal pulses.     Heart sounds: Normal heart sounds. No murmur heard.    No friction rub. No gallop.  Pulmonary:     Effort: Pulmonary effort is normal. No respiratory distress.     Breath sounds: Normal breath sounds. No stridor. No wheezing, rhonchi or rales.  Chest:     Chest wall: No tenderness.  Abdominal:     General: Bowel sounds are normal. There is no distension.     Palpations: Abdomen is soft. There is no mass.     Tenderness: There is no abdominal tenderness. There is no right CVA tenderness, left CVA tenderness, guarding or rebound.     Hernia: No hernia is present.  Musculoskeletal:        General: No swelling, tenderness, deformity or signs of injury. Normal range of motion.     Right lower leg: No edema.     Left lower leg: No edema.  Lymphadenopathy:     Cervical: No cervical adenopathy.  Skin:    General: Skin is warm and dry.     Capillary Refill: Capillary refill takes less than 2 seconds.     Coloration: Skin is not jaundiced or pale.     Findings: No bruising, erythema, lesion or rash.  Neurological:     General: No focal deficit present.     Mental Status: He is alert and oriented to person, place, and time.     Cranial Nerves: No cranial nerve deficit.     Sensory: No sensory deficit.     Motor: No weakness.     Coordination: Coordination normal.     Gait: Gait normal.     Deep Tendon Reflexes: Reflexes normal.  Psychiatric:        Mood and Affect: Mood normal.        Behavior: Behavior normal.  Thought Content: Thought content normal.        Judgment: Judgment normal.       Assessment & Plan:  1. Routine general medical examination at a health care facility - Follow up in one year or sooner if needed - Lipid panel; Future - TSH; Future - CBC with Differential/Platelet; Future - Comprehensive metabolic panel; Future - VITAMIN D 25 Hydroxy (Vit-D Deficiency, Fractures); Future  2. Mixed hyperlipidemia - Consider increase in  statin  - Lipid panel; Future - TSH; Future - CBC with Differential/Platelet; Future - Comprehensive metabolic panel; Future - VITAMIN D 25 Hydroxy (Vit-D Deficiency, Fractures); Future  3. Essential hypertension, benign - Better controlled in the past. Continue with same medications  - Lipid panel; Future - TSH; Future - CBC with Differential/Platelet; Future - Comprehensive metabolic panel; Future - VITAMIN D 25 Hydroxy (Vit-D Deficiency, Fractures); Future  4. Hyperparathyroidism, primary ([place]) - Continue to monitor  - Lipid panel; Future - TSH; Future - CBC with Differential/Platelet; Future - Comprehensive metabolic panel; Future - VITAMIN D 25 Hydroxy (Vit-D Deficiency, Fractures); Future  5. Arthritis - Continue Mobic  - Lipid panel; Future - TSH; Future - CBC with Differential/Platelet; Future - Comprehensive metabolic panel; Future - VITAMIN D 25 Hydroxy (Vit-D Deficiency, Fractures); Future  [name], NP

## 2022-08-31 DIAGNOSIS — Z96611 Presence of right artificial shoulder joint: Secondary | ICD-10-CM | POA: Diagnosis not present

## 2022-08-31 DIAGNOSIS — M6281 Muscle weakness (generalized): Secondary | ICD-10-CM | POA: Diagnosis not present

## 2022-08-31 LAB — LIPID PANEL
Cholesterol: 121 mg/dL (ref 0–200)
HDL: 34.1 mg/dL — ABNORMAL LOW (ref >39.00)
LDL Cholesterol: 56 mg/dL (ref 0–99)
NonHDL: 86.57
Total CHOL/HDL Ratio: 4
Triglycerides: 154 mg/dL — ABNORMAL HIGH (ref 0.0–149.0)
VLDL: 30.8 mg/dL (ref 0.0–40.0)

## 2022-09-19 DIAGNOSIS — Z96611 Presence of right artificial shoulder joint: Secondary | ICD-10-CM | POA: Diagnosis not present

## 2022-09-19 DIAGNOSIS — M6281 Muscle weakness (generalized): Secondary | ICD-10-CM | POA: Diagnosis not present

## 2022-11-06 ENCOUNTER — Other Ambulatory Visit: Payer: Self-pay | Admitting: Adult Health

## 2022-11-06 DIAGNOSIS — I10 Essential (primary) hypertension: Secondary | ICD-10-CM

## 2022-11-06 MED ORDER — FUROSEMIDE 20 MG PO TABS: ORAL_TABLET | ORAL | 1 refills | Status: DC

## 2022-11-06 MED ORDER — MELOXICAM 15 MG PO TABS: 15.0000 mg | ORAL_TABLET | Freq: Every day | ORAL | 1 refills | Status: DC

## 2022-11-06 NOTE — Telephone Encounter (Signed)
Rx refilled.

## 2022-11-06 NOTE — Telephone Encounter (Signed)
Pt called to request a refill of the following:  90 day supply of meloxicam (MOBIC) 15 MG tablet   90 day supply of furosemide (LASIX) 20 MG tablet    LOV:  08/30/22 = CPE  Please advise.  [id number] [place], [place] - [address]: [phone] Fax: [phone]

## 2022-11-11 ENCOUNTER — Other Ambulatory Visit: Payer: Self-pay | Admitting: Adult Health

## 2022-11-11 DIAGNOSIS — I10 Essential (primary) hypertension: Secondary | ICD-10-CM

## 2022-12-28 DIAGNOSIS — M19011 Primary osteoarthritis, right shoulder: Secondary | ICD-10-CM | POA: Diagnosis not present

## 2023-01-14 DIAGNOSIS — M6281 Muscle weakness (generalized): Secondary | ICD-10-CM | POA: Diagnosis not present

## 2023-01-14 DIAGNOSIS — M19011 Primary osteoarthritis, right shoulder: Secondary | ICD-10-CM | POA: Diagnosis not present

## 2023-01-14 DIAGNOSIS — M25611 Stiffness of right shoulder, not elsewhere classified: Secondary | ICD-10-CM | POA: Diagnosis not present

## 2023-01-31 DIAGNOSIS — L820 Inflamed seborrheic keratosis: Secondary | ICD-10-CM | POA: Diagnosis not present

## 2023-01-31 DIAGNOSIS — D225 Melanocytic nevi of trunk: Secondary | ICD-10-CM | POA: Diagnosis not present

## 2023-01-31 DIAGNOSIS — D2272 Melanocytic nevi of left lower limb, including hip: Secondary | ICD-10-CM | POA: Diagnosis not present

## 2023-01-31 DIAGNOSIS — L570 Actinic keratosis: Secondary | ICD-10-CM | POA: Diagnosis not present

## 2023-01-31 DIAGNOSIS — L821 Other seborrheic keratosis: Secondary | ICD-10-CM | POA: Diagnosis not present

## 2023-01-31 DIAGNOSIS — D2271 Melanocytic nevi of right lower limb, including hip: Secondary | ICD-10-CM | POA: Diagnosis not present

## 2023-02-12 ENCOUNTER — Ambulatory Visit (INDEPENDENT_AMBULATORY_CARE_PROVIDER_SITE_OTHER): Payer: HMO | Admitting: Adult Health

## 2023-02-12 ENCOUNTER — Encounter: Payer: Self-pay | Admitting: Adult Health

## 2023-02-12 VITALS — BP 120/80 | HR 62 | Temp 98.1°F | Wt 191.0 lb

## 2023-02-12 DIAGNOSIS — H6123 Impacted cerumen, bilateral: Secondary | ICD-10-CM | POA: Diagnosis not present

## 2023-02-12 NOTE — Progress Notes (Signed)
Subjective:    Patient ID: Paul Lowe, male    DOB: [birth year], 82 y.o.   MRN: [id number]  HPI 82 year old male who  has a past medical history of Actinic keratosis, Adult acne, Allergic rhinitis, Asthma, Cataracts, bilateral, Hyperlipidemia, Hypertension, Idiopathic parathyroidism ([facility]), Osteoarthritis, Seasonal allergies, and Seizure ([facility]) (1990's).  He presents to the office today for concern of cerumen impaction. His hearing is muffled. Denies pain or drainage    Review of Systems See HPI   Past Medical History:  Diagnosis Date   Actinic keratosis    Adult acne    Allergic rhinitis    Asthma    Cataracts, bilateral    Hyperlipidemia    Hypertension    Idiopathic parathyroidism ([facility])    Osteoarthritis    Seasonal allergies    Seizure ([facility]) 1990's   no seizure activity since    Social History   Socioeconomic History   Marital status: Married    Spouse name: Not on file   Number of children: Not on file   Years of education: Not on file   Highest education level: Not on file  Occupational History   Not on file  Tobacco Use   Smoking status: Never   Smokeless tobacco: Never  Vaping Use   Vaping Use: Never used  Substance and Sexual Activity   Alcohol use: Not Currently    Comment: occasionally   Drug use: No   Sexual activity: Not on file  Other Topics Concern   Not on file  Social History Narrative   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   Married    No kids       He likes to play tennis    Social Determinants of Corporate investment banker Strain: Low Risk  (06/22/2022)   Overall Financial Resource Strain (CARDIA)    Difficulty of Paying Living Expenses: Not hard at all  Food Insecurity: No Food Insecurity (06/22/2022)   Hunger Vital Sign    Worried About Running Out of Food in the Last Year: Never true    Ran Out of Food in the Last Year: Never true  Transportation Needs: No Transportation Needs (06/21/2021)   PRAPARE - Therapist, art (Medical): No    Lack of Transportation (Non-Medical): No  Physical Activity: Sufficiently Active (06/22/2022)   Exercise Vital Sign    Days of Exercise per Week: 4 days    Minutes of Exercise per Session: 40 min  Stress: No Stress Concern Present (06/22/2022)   [employer] of Occupational Health - Occupational Stress Questionnaire    Feeling of Stress : Not at all  Social Connections: Socially Integrated (06/22/2022)   Social Connection and Isolation Panel [NHANES]    Frequency of Communication with Friends and Family: More than three times a week    Frequency of Social Gatherings with Friends and Family: More than three times a week    Attends Religious Services: More than 4 times per year    Active Member of [employer] or Organizations: Yes    Attends Banker Meetings: More than 4 times per year    Marital Status: Married  Catering manager Violence: Not At Risk (06/22/2022)   Humiliation, Afraid, Rape, and Kick questionnaire    Fear of Current or Ex-Partner: No    Emotionally Abused: No    Physically Abused: No    Sexually Abused: No    Past Surgical History:  Procedure Laterality Date   anal cyst     APPENDECTOMY     CATARACT EXTRACTION, BILATERAL     PARATHYROIDECTOMY N/A 03/10/2015   Procedure: PARATHYROIDECTOMY;  Surgeon: Darnell Level, MD;  Location: [facility];  Service: General;  Laterality: N/A;   REPLACEMENT TOTAL KNEE Bilateral    REVERSE SHOULDER ARTHROPLASTY Right 05/31/2022   Procedure: REVERSE SHOULDER ARTHROPLASTY;  Surgeon: Bjorn Pippin, MD;  Location: [facility];  Service: Orthopedics;  Laterality: Right;    Family History  Problem Relation Age of Onset   Stroke Mother    Colon cancer Neg Hx    Esophageal cancer Neg Hx    Rectal cancer Neg Hx    Stomach cancer Neg Hx     Allergies  Allergen Reactions   Shrimp Flavor     REACTION: rash    Current Outpatient Medications on File Prior to Visit  Medication Sig  Dispense Refill   Ascorbic Acid (VITAMIN C) 100 MG tablet Take 500 mg by mouth daily.     doxycycline (VIBRA-TABS) 100 MG tablet Take 100 mg by mouth 2 (two) times daily.     fish oil-omega-3 fatty acids 1000 MG capsule Take 1 g by mouth daily.      furosemide (LASIX) 20 MG tablet TAKE ONE TABLET BY MOUTH EVERY DAY 270 tablet 1   lisinopril (ZESTRIL) 10 MG tablet TAKE 1 TABLET(10 MG) BY MOUTH DAILY 90 tablet 3   loratadine (CLARITIN) 10 MG tablet Take 10 mg by mouth daily.     meloxicam (MOBIC) 15 MG tablet Take 1 tablet (15 mg total) by mouth daily. For 2 weeks for pain and inflammation. Then take as needed 90 tablet 1   Multiple Vitamin tablet Take 1 a day 30 tablet 0   simvastatin (ZOCOR) 20 MG tablet TAKE 1 TABLET(20 MG) BY MOUTH DAILY AT 6 PM 90 tablet 1   No current facility-administered medications on file prior to visit.    BP 120/80   Pulse 62   Temp 98.1 F (36.7 C)   Wt 191 lb (86.6 kg)   SpO2 95%   BMI 27.80 kg/m       Objective:   Physical Exam Vitals and nursing note reviewed.  Constitutional:      Appearance: Normal appearance.  HENT:     Right Ear: There is impacted cerumen.     Left Ear: There is impacted cerumen.  Skin:    General: Skin is warm and dry.  Neurological:     General: No focal deficit present.     Mental Status: He is alert and oriented to person, place, and time.  Psychiatric:        Mood and Affect: Mood normal.        Behavior: Behavior normal.        Thought Content: Thought content normal.        Judgment: Judgment normal.       Assessment & Plan:  1. Bilateral impacted cerumen Ear Cerumen Removal  Date/Time: 02/12/2023 10:36 AM  Performed by: Shirline Frees, NP Authorized by: Shirline Frees, NP   Anesthesia: Local Anesthetic: none Location details: right ear and left ear Patient tolerance: patient tolerated the procedure well with no immediate complications Comments: No signs of trauma. After irrigation TM's visualized  bilaterally. Patient reported relief of symptoms  Procedure type: irrigation  Sedation: Patient sedated: no

## 2023-02-13 ENCOUNTER — Other Ambulatory Visit: Payer: Self-pay | Admitting: Adult Health

## 2023-04-05 DIAGNOSIS — M19011 Primary osteoarthritis, right shoulder: Secondary | ICD-10-CM | POA: Diagnosis not present

## 2023-04-05 DIAGNOSIS — M19012 Primary osteoarthritis, left shoulder: Secondary | ICD-10-CM | POA: Diagnosis not present

## 2023-06-09 ENCOUNTER — Other Ambulatory Visit: Payer: Self-pay | Admitting: Adult Health

## 2023-06-27 ENCOUNTER — Telehealth: Payer: PPO | Admitting: Family Medicine

## 2023-07-04 ENCOUNTER — Telehealth (INDEPENDENT_AMBULATORY_CARE_PROVIDER_SITE_OTHER): Payer: HMO | Admitting: Family Medicine

## 2023-07-04 DIAGNOSIS — Z0000 Encounter for general adult medical examination without abnormal findings: Secondary | ICD-10-CM

## 2023-07-04 MED ORDER — ACETAMINOPHEN 500 MG PO TABS: 1000.0000 mg | ORAL_TABLET | Freq: Two times a day (BID) | ORAL | Status: AC | PRN

## 2023-07-04 NOTE — Progress Notes (Signed)
Patient was unable to self-report due to a lack of equipment at home via telehealth

## 2023-07-04 NOTE — Patient Instructions (Signed)
I really enjoyed getting to talk with you today! I am available on Tuesdays and Thursdays for virtual visits if you have any questions or concerns, or if I can be of any further assistance.   CHECKLIST FROM ANNUAL WELLNESS VISIT:  -Follow up (please call to schedule if not scheduled after visit):   -yearly for annual wellness visit with primary care office  Here is a list of your preventive care/health maintenance measures and the plan for each if any are due:  PLAN For any measures below that may be due:  -due for updated flu and covid vaccines and possibly the shingles vaccine  Health Maintenance  Topic Date Due   COVID-19 Vaccine (4 - 2023-24 season) 07/20/2023 (Originally 06/16/2023)   Zoster Vaccines- Shingrix (1 of 2) 10/03/2023 (Originally 11/09/1959)   INFLUENZA VACCINE  01/13/2024 (Originally 05/16/2023)   Medicare Annual Wellness (AWV)  07/03/2024   DTaP/Tdap/Td (3 - Td or Tdap) 12/18/2025   Pneumonia Vaccine 39+ Years old  Completed   HPV VACCINES  Aged Out   Hepatitis C Screening  Discontinued    -See a dentist at least yearly  -Get your eyes checked and then per your eye specialist's recommendations  -Other issues addressed today:   -I have included below further information regarding a healthy whole foods based diet, physical activity guidelines for adults, stress management and opportunities for social connections. I hope you find this information useful.   -----------------------------------------------------------------------------------------------------------------------------------------------------------------------------------------------------------------------------------------------------------  NUTRITION: -eat real food: lots of colorful vegetables (half the plate) and fruits -5-7 servings of vegetables and fruits per day (fresh or steamed is best), exp. 2 servings of vegetables with lunch and dinner and 2 servings of fruit per day. Berries and greens such  as kale and collards are great choices.  -consume on a regular basis: whole grains (make sure first ingredient on label contains the word "whole"), fresh fruits, fish, nuts, seeds, healthy oils (such as olive oil, avocado oil, grape seed oil) -may eat small amounts of dairy and lean meat on occasion, but avoid processed meats such as ham, bacon, lunch meat, etc. -drink water -try to avoid fast food and pre-packaged foods, processed meat -most experts advise limiting sodium to < 2300mg  per day, should limit further is any chronic conditions such as high blood pressure, heart disease, diabetes, etc. The American Heart Association advised that < 1500mg  is is ideal -try to avoid foods that contain any ingredients with names you do not recognize  -try to avoid sugar/sweets (except for the natural sugar that occurs in fresh fruit) -try to avoid sweet drinks -try to avoid white rice, white bread, pasta (unless whole grain), white or yellow potatoes  EXERCISE GUIDELINES FOR ADULTS: -if you wish to increase your physical activity, do so gradually and with the approval of your doctor -STOP and seek medical care immediately if you have any chest pain, chest discomfort or trouble breathing when starting or increasing exercise  -move and stretch your body, legs, feet and arms when sitting for long periods -Physical activity guidelines for optimal health in adults: -least 150 minutes per week of aerobic exercise (can talk, but not sing) once approved by your doctor, 20-30 minutes of sustained activity or two 10 minute episodes of sustained activity every day.  -resistance training at least 2 days per week if approved by your doctor -balance exercises 3+ days per week:   Stand somewhere where you have something sturdy to hold onto if you lose balance.    1) lift up  on toes, start with 5x per day and work up to 20x   2) stand and lift on leg straight out to the side so that foot is a few inches of the floor,  start with 5x each side and work up to 20x each side   3) stand on one foot, start with 5 seconds each side and work up to 20 seconds on each side  If you need ideas or help with getting more active:  -Silver sneakers https://tools.silversneakers.com  -Walk with a Doc: http://www.duncan-williams.com/  -try to include resistance (weight lifting/strength building) and balance exercises twice per week: or the following link for ideas: http://castillo-powell.com/  BuyDucts.dk  STRESS MANAGEMENT: -can try meditating, or just sitting quietly with deep breathing while intentionally relaxing all parts of your body for 5 minutes daily -if you need further help with stress, anxiety or depression please follow up with your primary doctor or contact the wonderful folks at [facility]: [phone]  SOCIAL CONNECTIONS: -options in [place] if you wish to engage in more social and exercise related activities:  -Silver sneakers https://tools.silversneakers.com  -Walk with a Doc: http://www.duncan-williams.com/  -Check out the [facility] Active Adults 50+ section on the [place] of [employer] (hiking clubs, book clubs, cards and games, chess, exercise classes, aquatic classes and much more) - see the website for details: https://www.[place].gov/departments/parks-recreation/active-adults50  -YouTube has lots of exercise videos for different ages and abilities as well  -[facility] (a variety of indoor and outdoor inperson activities for adults). ([phone]. [address]  -Virtual Online Classes (a variety of topics): see seniorplanet.org or call 978-314-4297  -consider volunteering at a school, hospice center, church, senior center or elsewhere

## 2023-07-04 NOTE — Progress Notes (Signed)
PATIENT CHECK-IN and HEALTH RISK ASSESSMENT QUESTIONNAIRE:  -completed by phone/video for upcoming Medicare Preventive Visit  Pre-Visit Check-in: 1)Vitals (height, wt, BP, etc) - record in vitals section for visit on day of visit Request home vitals (wt, BP, etc.) and enter into vitals, THEN update Vital Signs SmartPhrase below at the top of the HPI. See below.  2)Review and Update Medications, Allergies PMH, Surgeries, Social history in Epic 3)Hospitalizations in the last year with date/reason? No   4)Review and Update Care Team (patient's specialists) in Epic 5) Complete PHQ9 in Epic  6) Complete Fall Screening in Epic 7)Review all Health Maintenance Due and order under PCP if not done.  Medicare Wellness Patient Questionnaire:  Answer theses question about your habits: Do you drink alcohol? Yes  If yes, how many drinks do you have a day?1 beer a week Have you ever smoked?no  Quit date if applicable? N/A  How many packs a day do/did you smoke? NO  Do you use smokeless tobacco? No  Do you use an illicit drugs?NO  Do you exercises? NO IF so, what type and how many days/minutes per week?No  Are you sexually active? Yes Number of partners?1 Typical breakfast: egg, vegan bacon Typical lunch: soup, No Meat, sandwich  Typical dinner:  soup, Vegetables, no meat  Typical snacks: popcorn, cookies   Beverages: Water, Soda   Answer theses question about you: Can you perform most household chores?yes  Do you find it hard to follow a conversation in a noisy room? NO  Do you often ask people to speak up or repeat themselves? NO  Do you feel that you have a problem with memory? NO Do you balance your checkbook and or bank acounts? Yes  Do you feel safe at home?Yes  Last dentist visit? April 08, 2023 Do you need assistance with any of the following: Please note if so   Driving? No   Feeding yourself? No   Getting from bed to chair? No   Getting to the toilet? No   Bathing or showering? No    Dressing yourself? No   Managing money? No   Climbing a flight of stairs? No   Preparing meals? No     Do you have Advanced Directives in place (Living Will, Healthcare Power or Attorney)? yes   Last eye Exam and location? 8 month,  [place] [facility]    Do you currently use prescribed or non-prescribed narcotic or opioid pain medications? No   Do you have a history or close family history of breast, ovarian, tubal or peritoneal cancer or a family member with BRCA (breast cancer susceptibility 1 and 2) gene mutations? No   Request home vitals (wt, BP, etc.) and enter into vitals, THEN update Vital Signs SmartPhrase below at the top of the HPI. See below.   Nurse/Assistant Credentials/time stamp: Stann Ore CMA    ----------------------------------------------------------------------------------------------------------------------------------------------------------------------------------------------------------------------  Vital Signs: Unable to obtain new vitals due to this being a telehealth visit.   MEDICARE ANNUAL PREVENTIVE CARE VISIT WITH PROVIDER (Welcome to Medicare, initial annual wellness or annual wellness exam)  Virtual Visit via Phone Note  I connected with Laurena Slimmer on 07/04/23  by phone and verified that I am speaking with the correct person using two identifiers.  Location patient: home Location provider:work or home office Persons participating in the virtual visit: patient, provider  Concerns and/or follow up today: he chose to switched from meloxicam to tylenol. He didn't like the side effects he read about  on the meloxicam. Just taking 2 tabs in in the morning and occ one tab later if in pain. Still quite active around his home - recently installed gutter guards on his gutters himself.    See HM section in Epic for other details of completed HM.    ROS: negative for report of fevers, unintentional weight loss, vision changes, vision loss,  hearing loss or change, chest pain, sob, hemoptysis, melena, hematochezia, hematuria, falls, bleeding or bruising, thoughts of suicide or self harm, memory loss  Patient-completed extensive health risk assessment - reviewed and discussed with the patient: See Health Risk Assessment completed with patient prior to the visit either above or in recent phone note. This was reviewed in detailed with the patient today and appropriate recommendations, orders and referrals were placed as needed per Summary below and patient instructions.   Review of Medical History: -PMH, PSH, Family History and current specialty and care providers reviewed and updated and listed below   Patient Care Team: Shirline Frees, NP as PCP - General (Family Medicine) Verner Chol, [facility] (Inactive) as Pharmacist (Pharmacist)   Past Medical History:  Diagnosis Date   Actinic keratosis    Adult acne    Allergic rhinitis    Asthma    Cataracts, bilateral    Hyperlipidemia    Hypertension    Idiopathic parathyroidism (HCC)    Osteoarthritis    Seasonal allergies    Seizure (HCC) 1990's   no seizure activity since    Past Surgical History:  Procedure Laterality Date   anal cyst     APPENDECTOMY     CATARACT EXTRACTION, BILATERAL     PARATHYROIDECTOMY N/A 03/10/2015   Procedure: PARATHYROIDECTOMY;  Surgeon: Darnell Level, MD;  Location: [facility] OR;  Service: General;  Laterality: N/A;   REPLACEMENT TOTAL KNEE Bilateral    REVERSE SHOULDER ARTHROPLASTY Right 05/31/2022   Procedure: REVERSE SHOULDER ARTHROPLASTY;  Surgeon: Bjorn Pippin, MD;  Location: [facility];  Service: Orthopedics;  Laterality: Right;    Social History   Socioeconomic History   Marital status: Married    Spouse name: Not on file   Number of children: Not on file   Years of education: Not on file   Highest education level: Not on file  Occupational History   Not on file  Tobacco Use   Smoking status: Never   Smokeless  tobacco: Never  Vaping Use   Vaping status: Never Used  Substance and Sexual Activity   Alcohol use: Not Currently    Comment: occasionally   Drug use: No   Sexual activity: Not on file  Other Topics Concern   Not on file  Social History Narrative   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   Married    No kids       He likes to play tennis    Social Determinants of Corporate investment banker Strain: Low Risk  (06/22/2022)   Overall Financial Resource Strain (CARDIA)    Difficulty of Paying Living Expenses: Not hard at all  Food Insecurity: No Food Insecurity (06/22/2022)   Hunger Vital Sign    Worried About Running Out of Food in the Last Year: Never true    Ran Out of Food in the Last Year: Never true  Transportation Needs: No Transportation Needs (06/21/2021)   PRAPARE - Administrator, Civil Service (Medical): No    Lack of Transportation (Non-Medical): No  Physical Activity: Sufficiently Active (  06/22/2022)   Exercise Vital Sign    Days of Exercise per Week: 4 days    Minutes of Exercise per Session: 40 min  Stress: No Stress Concern Present (06/22/2022)   [employer] of Occupational Health - Occupational Stress Questionnaire    Feeling of Stress : Not at all  Social Connections: Socially Integrated (06/22/2022)   Social Connection and Isolation Panel [NHANES]    Frequency of Communication with Friends and Family: More than three times a week    Frequency of Social Gatherings with Friends and Family: More than three times a week    Attends Religious Services: More than 4 times per year    Active Member of [employer] or Organizations: Yes    Attends Engineer, structural: More than 4 times per year    Marital Status: Married  Catering manager Violence: Not At Risk (06/22/2022)   Humiliation, Afraid, Rape, and Kick questionnaire    Fear of Current or Ex-Partner: No    Emotionally Abused: No    Physically Abused: No    Sexually Abused: No    Family History   Problem Relation Age of Onset   Stroke Mother    Colon cancer Neg Hx    Esophageal cancer Neg Hx    Rectal cancer Neg Hx    Stomach cancer Neg Hx     Current Outpatient Medications on File Prior to Visit  Medication Sig Dispense Refill   Ascorbic Acid (VITAMIN C) 100 MG tablet Take 500 mg by mouth daily.     doxycycline (VIBRA-TABS) 100 MG tablet Take 100 mg by mouth 2 (two) times daily.     fish oil-omega-3 fatty acids 1000 MG capsule Take 1 g by mouth daily.      furosemide (LASIX) 20 MG tablet TAKE ONE TABLET BY MOUTH EVERY DAY 270 tablet 1   lisinopril (ZESTRIL) 10 MG tablet TAKE 1 TABLET(10 MG) BY MOUTH DAILY 90 tablet 3   loratadine (CLARITIN) 10 MG tablet Take 10 mg by mouth daily.     Multiple Vitamin tablet Take 1 a day 30 tablet 0   simvastatin (ZOCOR) 20 MG tablet TAKE 1 TABLET BY MOUTH DAILY 90 tablet 1   No current facility-administered medications on file prior to visit.    Allergies  Allergen Reactions   Shrimp Flavor     REACTION: rash       Physical Exam Vitals requested from patient and listed below if patient had equipment and was able to obtain at home for this virtual visit: There were no vitals filed for this visit. Estimated body mass index is 27.8 kg/m as calculated from the following:   Height as of 08/30/22: 5' 9.5" (1.765 m).   Weight as of 02/12/23: 191 lb (86.6 kg).  EKG (optional): deferred due to virtual visit  GENERAL: alert, oriented, no acute distress detected; full vision exam deferred due to pandemic and/or virtual encounter  PSYCH/NEURO: pleasant and cooperative, no obvious depression or anxiety, speech and thought processing grossly intact, Cognitive function grossly intact  Flowsheet Row Video Visit from 07/04/2023 in [facility]  PHQ-9 Total Score 0           07/04/2023    3:07 PM 09/22/2021   11:15 AM 08/16/2021    9:02 AM 06/21/2021    2:07 PM 06/21/2021    2:04 PM  Depression screen PHQ 2/9   Decreased Interest 0 0 0 0 0  Down, Depressed, Hopeless 0 1 0  0 0  PHQ - 2 Score 0 1 0 0 0  Altered sleeping 0 2     Tired, decreased energy 0 0     Change in appetite 0 0     Feeling bad or failure about yourself  0 0     Trouble concentrating 0 0     Moving slowly or fidgety/restless 0 0     Suicidal thoughts 0 0     PHQ-9 Score 0 3     Difficult doing work/chores Not difficult at all Not difficult at all          08/03/2020   10:42 AM 06/21/2021    2:07 PM 08/16/2021    9:01 AM 06/22/2022    9:13 AM 07/04/2023    3:07 PM  Fall Risk  Falls in the past year? 0 0 0 1 0  Was there an injury with Fall? 0 0 0 0 0  Fall Risk Category Calculator 0 0 0 1 0  Fall Risk Category (Retired) Low Low Low Low   (RETIRED) Patient Fall Risk Level Low fall risk Low fall risk Low fall risk Low fall risk   Patient at Risk for Falls Due to No Fall Risks No Fall Risks  No Fall Risks No Fall Risks  Fall risk Follow up Falls evaluation completed;Falls prevention discussed Falls evaluation completed   Falls evaluation completed     SUMMARY AND PLAN:  Encounter for Medicare annual wellness exam   Discussed applicable health maintenance/preventive health measures and advised and referred or ordered per patient preferences: -discussed vaccines due and where to get each. Suggested yearly in office follow up in November with PCP for labs.  Health Maintenance  Topic Date Due   COVID-19 Vaccine (4 - 2023-24 season) 07/20/2023 (Originally 06/16/2023)   Zoster Vaccines- Shingrix (1 of 2) 10/03/2023 (Originally 11/09/1959)   INFLUENZA VACCINE  01/13/2024 (Originally 05/16/2023)   Medicare Annual Wellness (AWV)  07/03/2024   DTaP/Tdap/Td (3 - Td or Tdap) 12/18/2025   Pneumonia Vaccine 74+ Years old  Completed   HPV VACCINES  Aged Out   Hepatitis C Screening  Discontinued    Education and counseling on the following was provided based on the above review of health and a plan/checklist for the patient, along  with additional information discussed, was provided for the patient in the patient instructions :  -Provided counseling and plan for increased risk of falling if applicable per above screening. Reviewed and safe balance exercises that can be done at home to improve balance and discussed exercise guidelines for adults with include balance exercises at least 3 days per week.  -Advised and counseled on a healthy lifestyle -Reviewed patient's current diet. Advised and counseled on a whole foods based healthy diet. A summary of a healthy diet was provided in the Patient Instructions.  -reviewed patient's current physical activity level and discussed exercise guidelines for adults. Discussed community resources and ideas for safe exercise at home to assist in meeting exercise guideline recommendations in a safe and healthy way.  -Advise yearly dental visits at minimum and regular eye exams -Advised and counseled on alcohol safe limits, risks  Follow up: see patient instructions   Patient Instructions  I really enjoyed getting to talk with you today! I am available on Tuesdays and Thursdays for virtual visits if you have any questions or concerns, or if I can be of any further assistance.   CHECKLIST FROM ANNUAL WELLNESS VISIT:  -Follow up (please call to  schedule if not scheduled after visit):   -yearly for annual wellness visit with primary care office  Here is a list of your preventive care/health maintenance measures and the plan for each if any are due:  PLAN For any measures below that may be due:  -due for updated flu and covid vaccines and possibly the shingles vaccine  Health Maintenance  Topic Date Due   COVID-19 Vaccine (4 - 2023-24 season) 07/20/2023 (Originally 06/16/2023)   Zoster Vaccines- Shingrix (1 of 2) 10/03/2023 (Originally 11/09/1959)   INFLUENZA VACCINE  01/13/2024 (Originally 05/16/2023)   Medicare Annual Wellness (AWV)  07/03/2024   DTaP/Tdap/Td (3 - Td or Tdap) 12/18/2025    Pneumonia Vaccine 2+ Years old  Completed   HPV VACCINES  Aged Out   Hepatitis C Screening  Discontinued    -See a dentist at least yearly  -Get your eyes checked and then per your eye specialist's recommendations  -Other issues addressed today:   -I have included below further information regarding a healthy whole foods based diet, physical activity guidelines for adults, stress management and opportunities for social connections. I hope you find this information useful.   -----------------------------------------------------------------------------------------------------------------------------------------------------------------------------------------------------------------------------------------------------------  NUTRITION: -eat real food: lots of colorful vegetables (half the plate) and fruits -5-7 servings of vegetables and fruits per day (fresh or steamed is best), exp. 2 servings of vegetables with lunch and dinner and 2 servings of fruit per day. Berries and greens such as kale and collards are great choices.  -consume on a regular basis: whole grains (make sure first ingredient on label contains the word "whole"), fresh fruits, fish, nuts, seeds, healthy oils (such as olive oil, avocado oil, grape seed oil) -may eat small amounts of dairy and lean meat on occasion, but avoid processed meats such as ham, bacon, lunch meat, etc. -drink water -try to avoid fast food and pre-packaged foods, processed meat -most experts advise limiting sodium to < 2300mg  per day, should limit further is any chronic conditions such as high blood pressure, heart disease, diabetes, etc. The American Heart Association advised that < 1500mg  is is ideal -try to avoid foods that contain any ingredients with names you do not recognize  -try to avoid sugar/sweets (except for the natural sugar that occurs in fresh fruit) -try to avoid sweet drinks -try to avoid white rice, white bread, pasta (unless  whole grain), white or yellow potatoes  EXERCISE GUIDELINES FOR ADULTS: -if you wish to increase your physical activity, do so gradually and with the approval of your doctor -STOP and seek medical care immediately if you have any chest pain, chest discomfort or trouble breathing when starting or increasing exercise  -move and stretch your body, legs, feet and arms when sitting for long periods -Physical activity guidelines for optimal health in adults: -least 150 minutes per week of aerobic exercise (can talk, but not sing) once approved by your doctor, 20-30 minutes of sustained activity or two 10 minute episodes of sustained activity every day.  -resistance training at least 2 days per week if approved by your doctor -balance exercises 3+ days per week:   Stand somewhere where you have something sturdy to hold onto if you lose balance.    1) lift up on toes, start with 5x per day and work up to 20x   2) stand and lift on leg straight out to the side so that foot is a few inches of the floor, start with 5x each side and work up to 20x each side  3) stand on one foot, start with 5 seconds each side and work up to 20 seconds on each side  If you need ideas or help with getting more active:  -Silver sneakers https://tools.silversneakers.com  -Walk with a Doc: http://www.duncan-williams.com/  -try to include resistance (weight lifting/strength building) and balance exercises twice per week: or the following link for ideas: http://castillo-powell.com/  BuyDucts.dk  STRESS MANAGEMENT: -can try meditating, or just sitting quietly with deep breathing while intentionally relaxing all parts of your body for 5 minutes daily -if you need further help with stress, anxiety or depression please follow up with your primary doctor or contact the wonderful folks at [facility]: [phone]  SOCIAL  CONNECTIONS: -options in [place] if you wish to engage in more social and exercise related activities:  -Silver sneakers https://tools.silversneakers.com  -Walk with a Doc: http://www.duncan-williams.com/  -Check out the [facility] Active Adults 50+ section on the [place] of [employer] (hiking clubs, book clubs, cards and games, chess, exercise classes, aquatic classes and much more) - see the website for details: https://www.[place].gov/departments/parks-recreation/active-adults50  -YouTube has lots of exercise videos for different ages and abilities as well  -[facility] (a variety of indoor and outdoor inperson activities for adults). [phone]. [address]  -Virtual Online Classes (a variety of topics): see seniorplanet.org or call [phone]  -consider volunteering at a school, hospice center, church, senior center or elsewhere           Terressa Koyanagi, DO

## 2023-07-30 ENCOUNTER — Encounter: Payer: Self-pay | Admitting: Adult Health

## 2023-07-30 NOTE — Telephone Encounter (Signed)
Please advise 

## 2023-08-09 ENCOUNTER — Other Ambulatory Visit: Payer: Self-pay | Admitting: Adult Health

## 2023-08-09 DIAGNOSIS — I10 Essential (primary) hypertension: Secondary | ICD-10-CM

## 2023-08-26 ENCOUNTER — Other Ambulatory Visit: Payer: Self-pay | Admitting: Adult Health

## 2023-09-02 DIAGNOSIS — L309 Dermatitis, unspecified: Secondary | ICD-10-CM | POA: Diagnosis not present

## 2023-09-02 DIAGNOSIS — C44622 Squamous cell carcinoma of skin of right upper limb, including shoulder: Secondary | ICD-10-CM | POA: Diagnosis not present

## 2023-09-02 DIAGNOSIS — L821 Other seborrheic keratosis: Secondary | ICD-10-CM | POA: Diagnosis not present

## 2023-09-02 DIAGNOSIS — D485 Neoplasm of uncertain behavior of skin: Secondary | ICD-10-CM | POA: Diagnosis not present

## 2023-09-25 ENCOUNTER — Ambulatory Visit (INDEPENDENT_AMBULATORY_CARE_PROVIDER_SITE_OTHER): Payer: HMO

## 2023-09-25 ENCOUNTER — Encounter: Payer: Self-pay | Admitting: Podiatry

## 2023-09-25 ENCOUNTER — Ambulatory Visit (INDEPENDENT_AMBULATORY_CARE_PROVIDER_SITE_OTHER): Payer: HMO | Admitting: Podiatry

## 2023-09-25 DIAGNOSIS — M722 Plantar fascial fibromatosis: Secondary | ICD-10-CM

## 2023-09-25 DIAGNOSIS — M205X1 Other deformities of toe(s) (acquired), right foot: Secondary | ICD-10-CM | POA: Diagnosis not present

## 2023-09-25 DIAGNOSIS — M778 Other enthesopathies, not elsewhere classified: Secondary | ICD-10-CM | POA: Diagnosis not present

## 2023-09-25 MED ORDER — TRIAMCINOLONE ACETONIDE 10 MG/ML IJ SUSP
10.0000 mg | Freq: Once | INTRAMUSCULAR | Status: AC
Start: 2023-09-25 — End: 2023-09-25
  Administered 2023-09-25: 10 mg via INTRA_ARTICULAR

## 2023-09-25 NOTE — Progress Notes (Unsigned)
Subjective:   Patient ID: Paul Lowe, male   DOB: 82 y.o.   MRN: [id number]   HPI Patient presents with a lot of pain of the right heel and also a large bone spur on the big toe joint right over left that at times can be painful.  Patient states the heel pain has been present for several months and patient does not smoke likes to be active   Review of Systems  All other systems reviewed and are negative.       Objective:  Physical Exam Vitals and nursing note reviewed.  Constitutional:      Appearance: He is well-developed.  Pulmonary:     Effort: Pulmonary effort is normal.  Musculoskeletal:        General: Normal range of motion.  Skin:    General: Skin is warm.  Neurological:     Mental Status: He is alert.     Neurovascular status intact muscle strength adequate range of motion adequate with exquisite discomfort plantar aspect right heel at the insertional point tendon calcaneus with inflammation fluid around the medial band.  Patient is noted to have good digital perfusion well-oriented significant loss of range of motion first MPJ with large dorsal spur formation.  Good digital formation of the digits     Assessment:  Acute plantar fasciitis right several months duration along with hallux limitus deformity right     Plan:  H&P all conditions reviewed sterile prep injected the plantar fascia right 3 mg Kenalog 5 mg Xylocaine discussed bunion we could remove the bone spur but the only thing we can do for the lack of motion would be fusion or implantation and he is not interested in this at the current time.  Reappoint to recheck  X-rays indicate small spur moderate cavus foot structure large spurring around the first metatarsal head narrowness of the joint surface consistent with arthritis

## 2023-09-30 ENCOUNTER — Ambulatory Visit: Payer: HMO | Admitting: Podiatry

## 2023-11-11 ENCOUNTER — Other Ambulatory Visit: Payer: Self-pay | Admitting: Adult Health

## 2023-11-11 DIAGNOSIS — I10 Essential (primary) hypertension: Secondary | ICD-10-CM

## 2023-11-12 NOTE — Telephone Encounter (Signed)
Pt needs a CPE for further refills. Will send in 30 day supply.

## 2023-11-13 DIAGNOSIS — D3131 Benign neoplasm of right choroid: Secondary | ICD-10-CM | POA: Diagnosis not present

## 2023-12-05 ENCOUNTER — Other Ambulatory Visit: Payer: Self-pay | Admitting: Adult Health

## 2023-12-05 DIAGNOSIS — I10 Essential (primary) hypertension: Secondary | ICD-10-CM

## 2023-12-23 DIAGNOSIS — J3089 Other allergic rhinitis: Secondary | ICD-10-CM | POA: Diagnosis not present

## 2023-12-28 ENCOUNTER — Other Ambulatory Visit: Payer: Self-pay | Admitting: Adult Health

## 2023-12-28 DIAGNOSIS — I10 Essential (primary) hypertension: Secondary | ICD-10-CM

## 2024-01-10 ENCOUNTER — Ambulatory Visit (INDEPENDENT_AMBULATORY_CARE_PROVIDER_SITE_OTHER): Payer: HMO | Admitting: Adult Health

## 2024-01-10 VITALS — BP 110/70 | HR 59 | Temp 97.9°F | Ht 68.5 in | Wt 190.0 lb

## 2024-01-10 DIAGNOSIS — Z125 Encounter for screening for malignant neoplasm of prostate: Secondary | ICD-10-CM

## 2024-01-10 DIAGNOSIS — M1990 Unspecified osteoarthritis, unspecified site: Secondary | ICD-10-CM | POA: Diagnosis not present

## 2024-01-10 DIAGNOSIS — I10 Essential (primary) hypertension: Secondary | ICD-10-CM | POA: Diagnosis not present

## 2024-01-10 DIAGNOSIS — E210 Primary hyperparathyroidism: Secondary | ICD-10-CM | POA: Diagnosis not present

## 2024-01-10 DIAGNOSIS — L719 Rosacea, unspecified: Secondary | ICD-10-CM | POA: Diagnosis not present

## 2024-01-10 DIAGNOSIS — E782 Mixed hyperlipidemia: Secondary | ICD-10-CM

## 2024-01-10 DIAGNOSIS — Z0000 Encounter for general adult medical examination without abnormal findings: Secondary | ICD-10-CM | POA: Diagnosis not present

## 2024-01-10 LAB — COMPREHENSIVE METABOLIC PANEL WITH GFR
ALT: 20 U/L (ref 0–53)
AST: 16 U/L (ref 0–37)
Albumin: 4.4 g/dL (ref 3.5–5.2)
Alkaline Phosphatase: 57 U/L (ref 39–117)
BUN: 25 mg/dL — ABNORMAL HIGH (ref 6–23)
CO2: 27 meq/L (ref 19–32)
Calcium: 9.3 mg/dL (ref 8.4–10.5)
Chloride: 103 meq/L (ref 96–112)
Creatinine, Ser: 1.42 mg/dL (ref 0.40–1.50)
GFR: 46.45 mL/min — ABNORMAL LOW (ref >60.00)
Glucose, Bld: 93 mg/dL (ref 70–99)
Potassium: 5.6 meq/L — ABNORMAL HIGH (ref 3.5–5.1)
Sodium: 139 meq/L (ref 135–145)
Total Bilirubin: 0.9 mg/dL (ref 0.2–1.2)
Total Protein: 6.8 g/dL (ref 6.0–8.3)

## 2024-01-10 LAB — LIPID PANEL
Cholesterol: 127 mg/dL (ref 0–200)
HDL: 32.7 mg/dL — ABNORMAL LOW (ref >39.00)
LDL Cholesterol: 63 mg/dL (ref 0–99)
NonHDL: 94.02
Total CHOL/HDL Ratio: 4
Triglycerides: 154 mg/dL — ABNORMAL HIGH (ref 0.0–149.0)
VLDL: 30.8 mg/dL (ref 0.0–40.0)

## 2024-01-10 LAB — CBC
HCT: 44.6 % (ref 39.0–52.0)
Hemoglobin: 15.3 g/dL (ref 13.0–17.0)
MCHC: 34.3 g/dL (ref 30.0–36.0)
MCV: 97.8 fl (ref 78.0–100.0)
Platelets: 195 10*3/uL (ref 150.0–400.0)
RBC: 4.56 Mil/uL (ref 4.22–5.81)
RDW: 12.8 % (ref 11.5–15.5)
WBC: 7.8 10*3/uL (ref 4.0–10.5)

## 2024-01-10 LAB — PSA: PSA: 4.31 ng/mL — ABNORMAL HIGH (ref 0.10–4.00)

## 2024-01-10 LAB — TSH: TSH: 1.05 u[IU]/mL (ref 0.35–5.50)

## 2024-01-10 NOTE — Progress Notes (Signed)
 Subjective:    Patient ID: Paul Lowe, male    DOB: [birth year], 83 y.o.   MRN: [id number]  HPI Patient presents for yearly preventative medicine examination. He is a pleasant 83 year old male who  has a past medical history of Actinic keratosis, Adult acne, Allergic rhinitis, Asthma, Cataracts, bilateral, Hyperlipidemia, Hypertension, Idiopathic parathyroidism (HCC), Osteoarthritis, Seasonal allergies, and Seizure (HCC) (1990's).  Hyperlipidemia-currently managed with Zocor 20 mg daily.  He denies myalgia or fatigue Lab Results  Component Value Date   CHOL 121 08/30/2022   HDL 34.10 (L) 08/30/2022   LDLCALC 56 08/30/2022   TRIG 154.0 (H) 08/30/2022   CHOLHDL 4 08/30/2022   Hypertension-managed with lisinopril 10 mg daily and Lasix 20 mg daily.  He denies dizziness, lightheadedness, chest pain, or shortness of breath. He did not take his medication this morning.  BP Readings from Last 3 Encounters:  01/10/24 110/70  02/12/23 120/80  08/30/22 (!) 140/100   Osteoarthritis-managed with Mobic 15 mg daily as needed.  He does feel as though this patient adequately controls his pain. He is also see at [facility]   Rosacea -doxycycline 100 mg twice daily infrequently.   Hyperparathyroidism-had parathyroidectomy in May 2016.  He is currently not on any medications. Calcium levels within normal range.   All immunizations and health maintenance protocols were reviewed with the patient and needed orders were placed.  Appropriate screening laboratory values were ordered for the patient including screening of hyperlipidemia, renal function and hepatic function. If indicated by BPH, a PSA was ordered.  Medication reconciliation,  past medical history, social history, problem list and allergies were reviewed in detail with the patient  Goals were established with regard to weight loss, exercise, and  diet in compliance with medications. He stays active and tries to eat healthy     Review of Systems  Constitutional: Negative.   HENT: Negative.    Eyes: Negative.   Respiratory: Negative.    Cardiovascular: Negative.   Gastrointestinal: Negative.   Endocrine: Negative.   Genitourinary: Negative.   Musculoskeletal:  Positive for arthralgias.  Skin: Negative.   Allergic/Immunologic: Negative.   Neurological: Negative.   Hematological: Negative.   Psychiatric/Behavioral: Negative.    All other systems reviewed and are negative.  Past Medical History:  Diagnosis Date   Actinic keratosis    Adult acne    Allergic rhinitis    Asthma    Cataracts, bilateral    Hyperlipidemia    Hypertension    Idiopathic parathyroidism (HCC)    Osteoarthritis    Seasonal allergies    Seizure (HCC) 1990's   no seizure activity since    Social History   Socioeconomic History   Marital status: Married    Spouse name: Not on file   Number of children: Not on file   Years of education: Not on file   Highest education level: Not on file  Occupational History   Not on file  Tobacco Use   Smoking status: Never   Smokeless tobacco: Never  Vaping Use   Vaping status: Never Used  Substance and Sexual Activity   Alcohol use: Not Currently    Comment: occasionally   Drug use: No   Sexual activity: Not on file  Other Topics Concern   Not on file  Social History Narrative   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   Married    No kids       He likes to play tennis  Social Drivers of Corporate investment banker Strain: Low Risk  (06/22/2022)   Overall Financial Resource Strain (CARDIA)    Difficulty of Paying Living Expenses: Not hard at all  Food Insecurity: No Food Insecurity (06/22/2022)   Hunger Vital Sign    Worried About Running Out of Food in the Last Year: Never true    Ran Out of Food in the Last Year: Never true  Transportation Needs: No Transportation Needs (06/21/2021)   PRAPARE - Administrator, Civil Service (Medical): No    Lack of  Transportation (Non-Medical): No  Physical Activity: Sufficiently Active (06/22/2022)   Exercise Vital Sign    Days of Exercise per Week: 4 days    Minutes of Exercise per Session: 40 min  Stress: No Stress Concern Present (06/22/2022)   [employer] of Occupational Health - Occupational Stress Questionnaire    Feeling of Stress : Not at all  Social Connections: Socially Integrated (06/22/2022)   Social Connection and Isolation Panel [NHANES]    Frequency of Communication with Friends and Family: More than three times a week    Frequency of Social Gatherings with Friends and Family: More than three times a week    Attends Religious Services: More than 4 times per year    Active Member of [employer] or Organizations: Yes    Attends Banker Meetings: More than 4 times per year    Marital Status: Married  Catering manager Violence: Not At Risk (06/22/2022)   Humiliation, Afraid, Rape, and Kick questionnaire    Fear of Current or Ex-Partner: No    Emotionally Abused: No    Physically Abused: No    Sexually Abused: No    Past Surgical History:  Procedure Laterality Date   anal cyst     APPENDECTOMY     CATARACT EXTRACTION, BILATERAL     PARATHYROIDECTOMY N/A 03/10/2015   Procedure: PARATHYROIDECTOMY;  Surgeon: Darnell Level, MD;  Location: [facility] OR;  Service: General;  Laterality: N/A;   REPLACEMENT TOTAL KNEE Bilateral    REVERSE SHOULDER ARTHROPLASTY Right 05/31/2022   Procedure: REVERSE SHOULDER ARTHROPLASTY;  Surgeon: Bjorn Pippin, MD;  Location: [facility];  Service: Orthopedics;  Laterality: Right;    Family History  Problem Relation Age of Onset   Stroke Mother    Colon cancer Neg Hx    Esophageal cancer Neg Hx    Rectal cancer Neg Hx    Stomach cancer Neg Hx     Allergies  Allergen Reactions   Shrimp Flavor Agent (Non-Screening)     REACTION: rash    Current Outpatient Medications on File Prior to Visit  Medication Sig Dispense Refill    acetaminophen (TYLENOL) 500 MG tablet Take 2 tablets (1,000 mg total) by mouth 2 (two) times daily as needed for moderate pain.     Ascorbic Acid (VITAMIN C) 100 MG tablet Take 500 mg by mouth daily.     Calcium Carbonate (CALCIUM 500 PO) Calcium     fish oil-omega-3 fatty acids 1000 MG capsule Take 1 g by mouth daily.      fluticasone (FLONASE) 50 MCG/ACT nasal spray 1-2 sprays each nostril Nasally once a day for 30 days     furosemide (LASIX) 20 MG tablet TAKE 1 TABLET BY MOUTH EVERY DAY 30 tablet 0   lisinopril (ZESTRIL) 10 MG tablet TAKE 1 TABLET BY MOUTH EVERY DAY 90 tablet 2   loratadine (CLARITIN) 10 MG tablet Take 10 mg by mouth  daily.     Multiple Vitamin tablet Take 1 a day 30 tablet 0   Omega-3 Fatty Acids (FISH OIL) 300 MG CAPS Fish Oil     simvastatin (ZOCOR) 20 MG tablet TAKE 1 TABLET BY MOUTH EVERY DAY 90 tablet 1   No current facility-administered medications on file prior to visit.    BP 110/70   Pulse (!) 59   Temp 97.9 F (36.6 C) (Oral)   Ht 5' 8.5" (1.74 m)   Wt 190 lb (86.2 kg)   SpO2 99%   BMI 28.47 kg/m       Objective:   Physical Exam Vitals and nursing note reviewed.  Constitutional:      General: He is not in acute distress.    Appearance: Normal appearance. He is overweight. He is not ill-appearing.  HENT:     Head: Normocephalic and atraumatic.     Right Ear: Tympanic membrane, ear canal and external ear normal. There is no impacted cerumen.     Left Ear: Tympanic membrane, ear canal and external ear normal. There is no impacted cerumen.     Nose: Nose normal. No congestion or rhinorrhea.     Mouth/Throat:     Mouth: Mucous membranes are moist.     Pharynx: Oropharynx is clear.  Eyes:     Extraocular Movements: Extraocular movements intact.     Conjunctiva/sclera: Conjunctivae normal.     Pupils: Pupils are equal, round, and reactive to light.  Neck:     Vascular: No carotid bruit.  Cardiovascular:     Rate and Rhythm: Normal rate and  regular rhythm.     Pulses: Normal pulses.     Heart sounds: No murmur heard.    No friction rub. No gallop.  Pulmonary:     Effort: Pulmonary effort is normal.     Breath sounds: Normal breath sounds.  Abdominal:     General: Abdomen is flat. Bowel sounds are normal. There is no distension.     Palpations: Abdomen is soft. There is no mass.     Tenderness: There is no abdominal tenderness. There is no guarding or rebound.     Hernia: No hernia is present.  Musculoskeletal:        General: Normal range of motion.     Cervical back: Normal range of motion and neck supple.  Lymphadenopathy:     Cervical: No cervical adenopathy.  Skin:    General: Skin is warm and dry.     Capillary Refill: Capillary refill takes less than 2 seconds.  Neurological:     General: No focal deficit present.     Mental Status: He is alert and oriented to person, place, and time.  Psychiatric:        Mood and Affect: Mood normal.        Behavior: Behavior normal.        Thought Content: Thought content normal.        Judgment: Judgment normal.       Assessment & Plan:  1. Routine general medical examination at a health care facility (Primary) Today patient counseled on age appropriate routine health concerns for screening and prevention, each reviewed and up to date or declined. Immunizations reviewed and up to date or declined. Labs ordered and reviewed. Risk factors for depression reviewed and negative. Hearing function and visual acuity are intact. ADLs screened and addressed as needed. Functional ability and level of safety reviewed and appropriate. Education, counseling and referrals performed based  on assessed risks today. Patient provided with a copy of personalized plan for preventive services. - Continue to stay active and eat healthy  - Follow up in one year or sooner if needed  2. Mixed hyperlipidemia - Consider increase in statin  - Lipid panel; Future - TSH; Future - CBC; Future -  Comprehensive metabolic panel with GFR; Future - Comprehensive metabolic panel with GFR - CBC - TSH - Lipid panel  3. Essential hypertension, benign - Well controlled. No change in medication  - Lipid panel; Future - TSH; Future - CBC; Future - Comprehensive metabolic panel with GFR; Future - Comprehensive metabolic panel with GFR - CBC - TSH - Lipid panel  4. Arthritis - Continue with Mobic PRN  - Lipid panel; Future - TSH; Future - CBC; Future - Comprehensive metabolic panel with GFR; Future - Comprehensive metabolic panel with GFR - CBC - TSH - Lipid panel  5. ACNE ROSACEA  - Lipid panel; Future - TSH; Future - CBC; Future - Comprehensive metabolic panel with GFR; Future - Comprehensive metabolic panel with GFR - CBC - TSH - Lipid panel  6. Hyperparathyroidism, primary (HCC)  - Comprehensive metabolic panel with GFR; Future - PTH, Intact and Calcium; Future - PTH, Intact and Calcium - Comprehensive metabolic panel with GFR  7. Prostate cancer screening  - PSA; Future - PSA  Shirline Frees, NP

## 2024-01-10 NOTE — Patient Instructions (Signed)
 It was great seeing you today   We will follow up with you regarding your lab work   Please let me know if you need anything

## 2024-01-11 LAB — PTH, INTACT AND CALCIUM
Calcium: 9.4 mg/dL (ref 8.6–10.3)
PTH: 65 pg/mL (ref 16–77)

## 2024-01-14 ENCOUNTER — Other Ambulatory Visit: Payer: Self-pay | Admitting: Adult Health

## 2024-01-14 DIAGNOSIS — E875 Hyperkalemia: Secondary | ICD-10-CM

## 2024-01-14 DIAGNOSIS — R972 Elevated prostate specific antigen [PSA]: Secondary | ICD-10-CM

## 2024-01-24 ENCOUNTER — Other Ambulatory Visit: Payer: Self-pay | Admitting: Adult Health

## 2024-01-24 DIAGNOSIS — I10 Essential (primary) hypertension: Secondary | ICD-10-CM

## 2024-01-27 ENCOUNTER — Other Ambulatory Visit: Payer: Self-pay | Admitting: Adult Health

## 2024-02-03 DIAGNOSIS — D0462 Carcinoma in situ of skin of left upper limb, including shoulder: Secondary | ICD-10-CM | POA: Diagnosis not present

## 2024-02-03 DIAGNOSIS — L821 Other seborrheic keratosis: Secondary | ICD-10-CM | POA: Diagnosis not present

## 2024-02-03 DIAGNOSIS — Z85828 Personal history of other malignant neoplasm of skin: Secondary | ICD-10-CM | POA: Diagnosis not present

## 2024-02-03 DIAGNOSIS — L812 Freckles: Secondary | ICD-10-CM | POA: Diagnosis not present

## 2024-02-03 DIAGNOSIS — D1801 Hemangioma of skin and subcutaneous tissue: Secondary | ICD-10-CM | POA: Diagnosis not present

## 2024-02-03 DIAGNOSIS — D485 Neoplasm of uncertain behavior of skin: Secondary | ICD-10-CM | POA: Diagnosis not present

## 2024-02-14 ENCOUNTER — Other Ambulatory Visit (INDEPENDENT_AMBULATORY_CARE_PROVIDER_SITE_OTHER)

## 2024-02-14 ENCOUNTER — Encounter: Payer: Self-pay | Admitting: Adult Health

## 2024-02-14 DIAGNOSIS — R972 Elevated prostate specific antigen [PSA]: Secondary | ICD-10-CM | POA: Diagnosis not present

## 2024-02-14 LAB — PSA: PSA: 3.93 ng/mL (ref 0.10–4.00)

## 2024-05-20 ENCOUNTER — Other Ambulatory Visit: Payer: Self-pay | Admitting: Adult Health

## 2024-05-20 DIAGNOSIS — I10 Essential (primary) hypertension: Secondary | ICD-10-CM

## 2024-06-26 ENCOUNTER — Encounter: Payer: Self-pay | Admitting: Adult Health

## 2024-06-26 ENCOUNTER — Ambulatory Visit: Admitting: Adult Health

## 2024-06-26 VITALS — BP 130/80 | HR 74 | Temp 98.2°F | Ht 68.5 in | Wt 192.0 lb

## 2024-06-26 DIAGNOSIS — H6123 Impacted cerumen, bilateral: Secondary | ICD-10-CM | POA: Diagnosis not present

## 2024-06-26 NOTE — Progress Notes (Signed)
 Subjective:    Patient ID: Paul Lowe, male    DOB: 1941/07/15, 83 y.o.   MRN: [id number]  HPI 83 year old male who  has a past medical history of Actinic keratosis, Adult acne, Allergic rhinitis, Asthma, Cataracts, bilateral, Hyperlipidemia, Hypertension, Idiopathic parathyroidism (HCC), Osteoarthritis, Seasonal allergies, and Seizure (HCC) (1990's).  He presents to the office today for bilateral cerumen impaction. He feels as though his ears have been clogged. He denies pain or swelling. Has mild hearing loss.    Review of Systems See HPI   Past Medical History:  Diagnosis Date   Actinic keratosis    Adult acne    Allergic rhinitis    Asthma    Cataracts, bilateral    Hyperlipidemia    Hypertension    Idiopathic parathyroidism (HCC)    Osteoarthritis    Seasonal allergies    Seizure (HCC) 1990's   no seizure activity since    Social History   Socioeconomic History   Marital status: Married    Spouse name: Not on file   Number of children: Not on file   Years of education: Not on file   Highest education level: Not on file  Occupational History   Not on file  Tobacco Use   Smoking status: Never   Smokeless tobacco: Never  Vaping Use   Vaping status: Never Used  Substance and Sexual Activity   Alcohol use: Not Currently    Comment: occasionally   Drug use: No   Sexual activity: Not on file  Other Topics Concern   Not on file  Social History Narrative   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   Married    No kids       He likes to play tennis    Social Drivers of Corporate investment banker Strain: Low Risk  (06/22/2022)   Overall Financial Resource Strain (CARDIA)    Difficulty of Paying Living Expenses: Not hard at all  Food Insecurity: No Food Insecurity (06/22/2022)   Hunger Vital Sign    Worried About Running Out of Food in the Last Year: Never true    Ran Out of Food in the Last Year: Never true  Transportation Needs: No Transportation  Needs (06/21/2021)   PRAPARE - Administrator, Civil Service (Medical): No    Lack of Transportation (Non-Medical): No  Physical Activity: Sufficiently Active (06/22/2022)   Exercise Vital Sign    Days of Exercise per Week: 4 days    Minutes of Exercise per Session: 40 min  Stress: No Stress Concern Present (06/22/2022)   [employer] of Occupational Health - Occupational Stress Questionnaire    Feeling of Stress : Not at all  Social Connections: Socially Integrated (06/22/2022)   Social Connection and Isolation Panel    Frequency of Communication with Friends and Family: More than three times a week    Frequency of Social Gatherings with Friends and Family: More than three times a week    Attends Religious Services: More than 4 times per year    Active Member of [employer] or Organizations: Yes    Attends Banker Meetings: More than 4 times per year    Marital Status: Married  Catering manager Violence: Not At Risk (06/22/2022)   Humiliation, Afraid, Rape, and Kick questionnaire    Fear of Current or Ex-Partner: No    Emotionally Abused: No    Physically Abused: No    Sexually Abused:  No    Past Surgical History:  Procedure Laterality Date   anal cyst     APPENDECTOMY     CATARACT EXTRACTION, BILATERAL     PARATHYROIDECTOMY N/A 03/10/2015   Procedure: PARATHYROIDECTOMY;  Surgeon: Krystal Spinner, MD;  Location: [facility] OR;  Service: General;  Laterality: N/A;   REPLACEMENT TOTAL KNEE Bilateral    REVERSE SHOULDER ARTHROPLASTY Right 05/31/2022   Procedure: REVERSE SHOULDER ARTHROPLASTY;  Surgeon: Cristy Bonner DASEN, MD;  Location: [facility];  Service: Orthopedics;  Laterality: Right;    Family History  Problem Relation Age of Onset   Stroke Mother    Colon cancer Neg Hx    Esophageal cancer Neg Hx    Rectal cancer Neg Hx    Stomach cancer Neg Hx     Allergies  Allergen Reactions   Shrimp Flavor Agent (Non-Screening)     REACTION: rash    Current  Outpatient Medications on File Prior to Visit  Medication Sig Dispense Refill   acetaminophen  (TYLENOL ) 500 MG tablet Take 2 tablets (1,000 mg total) by mouth 2 (two) times daily as needed for moderate pain.     Ascorbic Acid (VITAMIN C) 100 MG tablet Take 500 mg by mouth daily.     Calcium Carbonate (CALCIUM 500 PO) Calcium     fish oil-omega-3 fatty acids 1000 MG capsule Take 1 g by mouth daily.      fluticasone (FLONASE) 50 MCG/ACT nasal spray 1-2 sprays each nostril Nasally once a day for 30 days     furosemide  (LASIX ) 20 MG tablet TAKE 1 TABLET BY MOUTH EVERY DAY 90 tablet 1   lisinopril  (ZESTRIL ) 10 MG tablet TAKE 1 TABLET BY MOUTH EVERY DAY 90 tablet 1   loratadine (CLARITIN) 10 MG tablet Take 10 mg by mouth daily.     Multiple Vitamin tablet Take 1 a day 30 tablet 0   Omega-3 Fatty Acids (FISH OIL) 300 MG CAPS Fish Oil     simvastatin  (ZOCOR ) 20 MG tablet TAKE 1 TABLET BY MOUTH EVERY DAY 90 tablet 1   No current facility-administered medications on file prior to visit.    BP 130/80   Pulse 74   Temp 98.2 F (36.8 C) (Oral)   Ht 5' 8.5 (1.74 m)   Wt 192 lb (87.1 kg)   SpO2 96%   BMI 28.77 kg/m       Objective:   Physical Exam Vitals and nursing note reviewed.  Constitutional:      Appearance: Normal appearance.  HENT:     Right Ear: There is impacted cerumen.     Left Ear: There is impacted cerumen.  Musculoskeletal:        General: Normal range of motion.  Skin:    General: Skin is warm and dry.  Neurological:     General: No focal deficit present.     Mental Status: He is alert and oriented to person, place, and time.  Psychiatric:        Mood and Affect: Mood normal.        Behavior: Behavior normal.        Thought Content: Thought content normal.        Judgment: Judgment normal.       Assessment & Plan:  1. Bilateral impacted cerumen (Primary) Ear Cerumen Removal  Date/Time: 06/26/2024 9:45 AM  Performed by: Merna Huxley, NP Authorized by:  Merna Huxley, NP   Anesthesia: Local Anesthetic: none Location details: right ear and left ear  Patient tolerance: patient tolerated the procedure well with no immediate complications Comments: Reports hearing improved. No pain. Ear canal and TM normal with no trauma noted bilaterally.  Procedure type: irrigation  Sedation: Patient sedated: no    Darleene Shape, NP

## 2024-07-11 ENCOUNTER — Other Ambulatory Visit: Payer: Self-pay | Admitting: Adult Health

## 2024-07-11 DIAGNOSIS — I10 Essential (primary) hypertension: Secondary | ICD-10-CM

## 2024-07-30 ENCOUNTER — Ambulatory Visit: Admitting: Family Medicine

## 2024-07-30 DIAGNOSIS — Z0000 Encounter for general adult medical examination without abnormal findings: Secondary | ICD-10-CM

## 2024-07-30 NOTE — Patient Instructions (Signed)
 I really enjoyed getting to talk with you today! I am available on Tuesdays and Thursdays for virtual visits if you have any questions or concerns, or if I can be of any further assistance.   CHECKLIST FROM ANNUAL WELLNESS VISIT:  -Follow up (please call to schedule if not scheduled after visit):   -yearly for annual wellness visit with primary care office  Here is a list of your preventive care/health maintenance measures and the plan for each if any are due:  PLAN For any measures below that may be due:    1. Due for flu and covid immunizations. Please let us  know if/when theses are done and we will update your records. Thanks!  Health Maintenance  Topic Date Due   Influenza Vaccine  05/15/2024   COVID-19 Vaccine (8 - 2025-26 season) 06/15/2024   Medicare Annual Wellness (AWV)  07/30/2025   DTaP/Tdap/Td (3 - Td or Tdap) 12/18/2025   Pneumococcal Vaccine: 50+ Years  Completed   Zoster Vaccines- Shingrix  Completed   Meningococcal B Vaccine  Aged Out   Hepatitis C Screening  Discontinued    -See a dentist at least yearly  -Get your eyes checked and then per your eye specialist's recommendations  -Other issues addressed today:   -I have included below further information regarding a healthy whole foods based diet, physical activity guidelines for adults, stress management and opportunities for social connections. I hope you find this information useful.   -----------------------------------------------------------------------------------------------------------------------------------------------------------------------------------------------------------------------------------------------------------    NUTRITION: -eat real food: lots of colorful vegetables (half the plate) and fruits -5-7 servings of vegetables and fruits per day (fresh or steamed is best), exp. 2 servings of vegetables with lunch and dinner and 2 servings of fruit per day. Berries and greens such as kale  and collards are great choices.  -consume on a regular basis:  fresh fruits, fresh veggies, fish, nuts, seeds, healthy oils (such as olive oil, avocado oil), whole grains (make sure for bread/pasta/crackers/etc., that the first ingredient on label contains the word whole), legumes. -can eat small amounts of dairy and lean meat (no larger than the palm of your hand), but avoid processed meats such as ham, bacon, lunch meat, etc. -drink water  -try to avoid fast food and pre-packaged foods, processed meat, ultra processed foods/beverages (donuts, candy, etc.) -most experts advise limiting sodium to < 2300mg  per day, should limit further is any chronic conditions such as high blood pressure, heart disease, diabetes, etc. The American Heart Association advised that < 1500mg  is is ideal -try to avoid foods/beverages that contain any ingredients with names you do not recognize  -try to avoid foods/beverages  with added sugar or sweeteners/sweets  -try to avoid sweet drinks (including diet drinks): soda, juice, Gatorade, sweet tea, power drinks, diet drinks -try to avoid white rice, white bread, pasta (unless whole grain)  EXERCISE GUIDELINES FOR ADULTS: -if you wish to increase your physical activity, do so gradually and with the approval of your doctor -STOP and seek medical care immediately if you have any chest pain, chest discomfort or trouble breathing when starting or increasing exercise  -move and stretch your body, legs, feet and arms when sitting for long periods -Physical activity guidelines for optimal health in adults: -get at least 150 minutes per week of moderate exercise (can talk, but not sing); this is about 20-30 minutes of sustained activity 5-7 days per week or two 10-15 minute episodes of sustained activity 5-7 days per week -do some muscle building/resistance training/strength training at least  2 days per week  -balance exercises 3+ days per week:   Stand somewhere where you have  something sturdy to hold onto if you lose balance    1) lift up on toes, then back down, start with 5x per day and work up to 20x   2) stand and lift one leg straight out to the side so that foot is a few inches of the floor, start with 5x each side and work up to 20x each side   3) stand on one foot, start with 5 seconds each side and work up to 20 seconds on each side  If you need ideas or help with getting more active:  -Silver sneakers https://tools.silversneakers.com  -Walk with a Doc: http://www.duncan-williams.com/  -try to include resistance (weight lifting/strength building) and balance exercises twice per week: or the following link for ideas: http://castillo-powell.com/  BuyDucts.dk  STRESS MANAGEMENT: -can try meditating, or just sitting quietly with deep breathing while intentionally relaxing all parts of your body for 5 minutes daily -if you need further help with stress, anxiety or depression please follow up with your primary doctor or contact the wonderful folks at [facility]: [phone]  SOCIAL CONNECTIONS: -options in [place] if you wish to engage in more social and exercise related activities:  -Silver sneakers https://tools.silversneakers.com  -Walk with a Doc: http://www.duncan-williams.com/  -Check out the [employer] Active Adults 50+ section on the [place] [employer] (hiking clubs, book clubs, cards and games, chess, exercise classes, aquatic classes and much more) - see the website for details: https://www.[place].gov/departments/parks-recreation/active-adults50  -YouTube has lots of exercise videos for different ages and abilities as well  -[facility] (a variety of indoor and outdoor inperson activities for adults). [phone]. [address]  -Virtual Online Classes (a variety of topics): see seniorplanet.org or call  [phone]  -consider volunteering at a school, hospice center, church, senior center or elsewhere

## 2024-07-30 NOTE — Progress Notes (Signed)
 PATIENT CHECK-IN and HEALTH RISK ASSESSMENT QUESTIONNAIRE:  -completed by phone/video for upcoming Medicare Preventive Visit   Pre-Visit Check-in: 1)Vitals (height, wt, BP, etc) - record in vitals section for visit on day of visit Request home vitals (wt, BP, etc.) and enter into vitals, THEN update Vital Signs SmartPhrase below at the top of the HPI. See below.  2)Review and Update Medications, Allergies PMH, Surgeries, Social history in Epic 3)Hospitalizations in the last year with date/reason? n  4)Review and Update Care Team (patient's specialists) in Epic 5) Complete PHQ9 in Epic  6) Complete Fall Screening in Epic 7)Review all Health Maintenance Due and order if not done.  Medicare Wellness Patient Questionnaire:  Answer theses question about your habits: How often do you have a drink containing alcohol? weekly How many drinks containing alcohol do you have on a typical day when you are drinking?1 How often do you have six or more drinks on one occasion?never Have you ever smoked?n Quit date if applicable? na  How many packs a day do/did you smoke? na Do you use smokeless tobacco?n Do you use an illicit drugs?n On average, how many days per week do you engage in moderate to strenuous exercise (like a brisk walk)?usually does wood, lots of work outside, plays tennis once a week with wife and friends,also he walks 1-1.5 miles at least 3x per week, very active Typical breakfast: egg, vegan bacon Typical lunch: sandwich, soup, etc Typical dinner:veggies, soup, fish but not other meats Typical snacks:popcorn  Beverages: water , soda (in moderation)  Answer theses question about your everyday activities: Can you perform most household chores?y Are you deaf or have significant trouble hearing?n Do you feel that you have a problem with memory?n Do you feel safe at home? y Last dentist visit?goes twice a year 8. Do you have any difficulty performing your everyday activities?n Are  you having any difficulty walking, taking medications on your own, and or difficulty managing daily home needs?n Do you have difficulty walking or climbing stairs?n Do you have difficulty dressing or bathing?n Do you have difficulty doing errands alone such as visiting a doctor's office or shopping?n Do you currently have any difficulty preparing food and eating?n Do you currently have any difficulty using the toilet?n Do you have any difficulty managing your finances?n Do you have any difficulties with housekeeping of managing your housekeeping?n   Do you have Advanced Directives in place (Living Will, Healthcare Power or Attorney)? y   Last eye Exam and location?[facility], [facility] a year   Do you currently use prescribed or non-prescribed narcotic or opioid pain medications?n  Do you have a history or close family history of breast, ovarian, tubal or peritoneal cancer or a family member with BRCA (breast cancer susceptibility 1 and 2) gene mutations?see pmh/fh        ----------------------------------------------------------------------------------------------------------------------------------------------------------------------------------------------------------------------  Because this visit was a virtual/telehealth visit, some criteria may be missing or patient reported. Any vitals not documented were not able to be obtained and vitals that have been documented are patient reported.    MEDICARE ANNUAL PREVENTIVE VISIT WITH PROVIDER: (Welcome to Medicare, initial annual wellness or annual wellness exam)  Virtual Visit via Phone Note  I connected with Paul Lowe by phone and verified that I am speaking with the correct person using two identifiers. He prefers to talk by phone.   Location patient: home Location provider:work or home office Persons participating in the virtual visit: patient, provider  Concerns and/or follow up  today: no new  concerns   See HM section in Epic for other details of completed HM.    ROS: negative for report of fevers, unintentional weight loss, vision changes, vision loss, hearing loss or change, chest pain, sob, hemoptysis, melena, hematochezia, hematuria, falls, bleeding or bruising, thoughts of suicide or self harm, memory loss  Patient-completed extensive health risk assessment - reviewed and discussed with the patient: See Health Risk Assessment completed with patient prior to the visit either above or in recent phone note. This was reviewed in detailed with the patient today and appropriate recommendations, orders and referrals were placed as needed per Summary below and patient instructions.   Review of Medical History: -PMH, PSH, Family History and current specialty and care providers reviewed and updated and listed below   Patient Care Team: Merna Huxley, NP as PCP - General (Family Medicine) Liane Sharyne MATSU, [facility] (Inactive) as Pharmacist (Pharmacist)   Past Medical History:  Diagnosis Date   Actinic keratosis    Adult acne    Allergic rhinitis    Asthma    Cataracts, bilateral    Hyperlipidemia    Hypertension    Idiopathic parathyroidism    Osteoarthritis    Seasonal allergies    Seizure (HCC) 1990's   no seizure activity since    Past Surgical History:  Procedure Laterality Date   anal cyst     APPENDECTOMY     CATARACT EXTRACTION, BILATERAL     PARATHYROIDECTOMY N/A 03/10/2015   Procedure: PARATHYROIDECTOMY;  Surgeon: Krystal Spinner, MD;  Location: [facility] OR;  Service: General;  Laterality: N/A;   REPLACEMENT TOTAL KNEE Bilateral    REVERSE SHOULDER ARTHROPLASTY Right 05/31/2022   Procedure: REVERSE SHOULDER ARTHROPLASTY;  Surgeon: Cristy Bonner DASEN, MD;  Location: [facility];  Service: Orthopedics;  Laterality: Right;    Social History   Socioeconomic History   Marital status: Married    Spouse name: Not on file   Number of children: Not on file   Years  of education: Not on file   Highest education level: Not on file  Occupational History   Not on file  Tobacco Use   Smoking status: Never   Smokeless tobacco: Never  Vaping Use   Vaping status: Never Used  Substance and Sexual Activity   Alcohol use: Not Currently    Comment: occasionally   Drug use: No   Sexual activity: Not on file  Other Topics Concern   Not on file  Social History Narrative   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   Married    No kids       He likes to play tennis    Social Drivers of Corporate investment banker Strain: Low Risk  (06/22/2022)   Overall Financial Resource Strain (CARDIA)    Difficulty of Paying Living Expenses: Not hard at all  Food Insecurity: No Food Insecurity (06/22/2022)   Hunger Vital Sign    Worried About Running Out of Food in the Last Year: Never true    Ran Out of Food in the Last Year: Never true  Transportation Needs: No Transportation Needs (06/21/2021)   PRAPARE - Administrator, Civil Service (Medical): No    Lack of Transportation (Non-Medical): No  Physical Activity: Sufficiently Active (06/22/2022)   Exercise Vital Sign    Days of Exercise per Week: 4 days    Minutes of Exercise per Session: 40 min  Stress: No Stress Concern Present (  06/22/2022)   [employer] of Occupational Health - Occupational Stress Questionnaire    Feeling of Stress : Not at all  Social Connections: Socially Integrated (06/22/2022)   Social Connection and Isolation Panel    Frequency of Communication with Friends and Family: More than three times a week    Frequency of Social Gatherings with Friends and Family: More than three times a week    Attends Religious Services: More than 4 times per year    Active Member of [employer] or Organizations: Yes    Attends Engineer, structural: More than 4 times per year    Marital Status: Married  Catering manager Violence: Not At Risk (06/22/2022)   Humiliation, Afraid, Rape, and Kick  questionnaire    Fear of Current or Ex-Partner: No    Emotionally Abused: No    Physically Abused: No    Sexually Abused: No    Family History  Problem Relation Age of Onset   Stroke Mother    Colon cancer Neg Hx    Esophageal cancer Neg Hx    Rectal cancer Neg Hx    Stomach cancer Neg Hx     Current Outpatient Medications on File Prior to Visit  Medication Sig Dispense Refill   acetaminophen  (TYLENOL ) 500 MG tablet Take 2 tablets (1,000 mg total) by mouth 2 (two) times daily as needed for moderate pain.     Ascorbic Acid (VITAMIN C) 100 MG tablet Take 500 mg by mouth daily.     Calcium Carbonate (CALCIUM 500 PO) Calcium     fish oil-omega-3 fatty acids 1000 MG capsule Take 1 g by mouth daily.      fluticasone (FLONASE) 50 MCG/ACT nasal spray 1-2 sprays each nostril Nasally once a day for 30 days     furosemide  (LASIX ) 20 MG tablet TAKE 1 TABLET BY MOUTH EVERY DAY 90 tablet 1   lisinopril  (ZESTRIL ) 10 MG tablet TAKE 1 TABLET BY MOUTH EVERY DAY 90 tablet 1   loratadine (CLARITIN) 10 MG tablet Take 10 mg by mouth daily.     Multiple Vitamin tablet Take 1 a day 30 tablet 0   Omega-3 Fatty Acids (FISH OIL) 300 MG CAPS Fish Oil     simvastatin  (ZOCOR ) 20 MG tablet TAKE 1 TABLET BY MOUTH EVERY DAY 90 tablet 1   No current facility-administered medications on file prior to visit.    Allergies  Allergen Reactions   Shrimp Flavor Agent (Non-Screening)     REACTION: rash       Physical Exam Vitals requested from patient and listed below if patient had equipment and was able to obtain at home for this virtual visit: There were no vitals filed for this visit. Estimated body mass index is 28.77 kg/m as calculated from the following:   Height as of 06/26/24: 5' 8.5 (1.74 m).   Weight as of 06/26/24: 192 lb (87.1 kg).  EKG (optional): deferred due to virtual visit  GENERAL: alert, oriented, no acute distress detected, full vision exam deferred due to pandemic and/or virtual  encounter  PSYCH/NEURO: pleasant and cooperative, no obvious depression or anxiety, speech and thought processing grossly intact, Cognitive function grossly intact  Flowsheet Row Video Visit from 07/04/2023 in [facility]  PHQ-9 Total Score 0        07/30/2024    5:07 PM 07/04/2023    3:07 PM 09/22/2021   11:15 AM 08/16/2021    9:02 AM 06/21/2021    2:07 PM  Depression screen PHQ 2/9  Decreased Interest 0 0 0 0 0  Down, Depressed, Hopeless 0 0 1 0 0  PHQ - 2 Score 0 0 1 0 0  Altered sleeping  0 2    Tired, decreased energy  0 0    Change in appetite  0 0    Feeling bad or failure about yourself   0 0    Trouble concentrating  0 0    Moving slowly or fidgety/restless  0 0    Suicidal thoughts  0 0    PHQ-9 Score  0 3    Difficult doing work/chores  Not difficult at all Not difficult at all         06/21/2021    2:07 PM 08/16/2021    9:01 AM 06/22/2022    9:13 AM 07/04/2023    3:07 PM 07/30/2024    5:04 PM  Fall Risk  Falls in the past year? 0 0 1 0 1  Was there an injury with Fall? 0 0 0 0 0  Fall Risk Category Calculator 0 0 1 0 1  Fall Risk Category (Retired) Low  Low  Low     (RETIRED) Patient Fall Risk Level Low fall risk  Low fall risk  Low fall risk     Patient at Risk for Falls Due to No Fall Risks  No Fall Risks No Fall Risks   Fall risk Follow up Falls evaluation completed    Falls evaluation completed Falls evaluation completed     Data saved with a previous flowsheet row definition   While playing tennis went for a tough shot and made the shot but took a fall, no serious injury.   SUMMARY AND PLAN:  Encounter for Medicare annual wellness exam  Discussed applicable health maintenance/preventive health measures and advised and referred or ordered per patient preferences: -reports is getting the flu and covid - asked that he please get a print out when he does and provide us  info so that we can update his record Health Maintenance   Topic Date Due   Influenza Vaccine  05/15/2024   COVID-19 Vaccine (8 - 2025-26 season) 06/15/2024   Medicare Annual Wellness (AWV)  07/30/2025   DTaP/Tdap/Td (3 - Td or Tdap) 12/18/2025   Pneumococcal Vaccine: 50+ Years  Completed   Zoster Vaccines- Shingrix  Completed   Meningococcal B Vaccine  Aged Out   Hepatitis C Screening  Discontinued      Education and counseling on the following was provided based on the above review of health and a plan/checklist for the patient, along with additional information discussed, was provided for the patient in the patient instructions :   -Provided  safe balance exercises that can be done at home to improve balance and discussed exercise guidelines for adults with include balance exercises at least 3 days per week - see patient instructions. -Advised and counseled on a healthy lifestyle - including the importance of a healthy diet, regular physical activity, social connections and stress management. -Reviewed patient's current diet. Advised and counseled on a whole foods based healthy diet. A summary of a healthy diet was provided in the Patient Instructions.  -reviewed patient's current physical activity level and discussed exercise guidelines for adults. Congratulated on healthy habits. Further info provided in patient instructions.  -Advise yearly dental visits at minimum and regular eye exams   Follow up: see patient instructions     Patient Instructions  I really enjoyed getting to talk  with you today! I am available on Tuesdays and Thursdays for virtual visits if you have any questions or concerns, or if I can be of any further assistance.   CHECKLIST FROM ANNUAL WELLNESS VISIT:  -Follow up (please call to schedule if not scheduled after visit):   -yearly for annual wellness visit with primary care office  Here is a list of your preventive care/health maintenance measures and the plan for each if any are due:  PLAN For any measures  below that may be due:    1. Due for flu and covid immunizations. Please let us  know if/when theses are done and we will update your records. Thanks!  Health Maintenance  Topic Date Due   Influenza Vaccine  05/15/2024   COVID-19 Vaccine (8 - 2025-26 season) 06/15/2024   Medicare Annual Wellness (AWV)  07/30/2025   DTaP/Tdap/Td (3 - Td or Tdap) 12/18/2025   Pneumococcal Vaccine: 50+ Years  Completed   Zoster Vaccines- Shingrix  Completed   Meningococcal B Vaccine  Aged Out   Hepatitis C Screening  Discontinued    -See a dentist at least yearly  -Get your eyes checked and then per your eye specialist's recommendations  -Other issues addressed today:   -I have included below further information regarding a healthy whole foods based diet, physical activity guidelines for adults, stress management and opportunities for social connections. I hope you find this information useful.   -----------------------------------------------------------------------------------------------------------------------------------------------------------------------------------------------------------------------------------------------------------    NUTRITION: -eat real food: lots of colorful vegetables (half the plate) and fruits -5-7 servings of vegetables and fruits per day (fresh or steamed is best), exp. 2 servings of vegetables with lunch and dinner and 2 servings of fruit per day. Berries and greens such as kale and collards are great choices.  -consume on a regular basis:  fresh fruits, fresh veggies, fish, nuts, seeds, healthy oils (such as olive oil, avocado oil), whole grains (make sure for bread/pasta/crackers/etc., that the first ingredient on label contains the word whole), legumes. -can eat small amounts of dairy and lean meat (no larger than the palm of your hand), but avoid processed meats such as ham, bacon, lunch meat, etc. -drink water  -try to avoid fast food and pre-packaged foods,  processed meat, ultra processed foods/beverages (donuts, candy, etc.) -most experts advise limiting sodium to < 2300mg  per day, should limit further is any chronic conditions such as high blood pressure, heart disease, diabetes, etc. The American Heart Association advised that < 1500mg  is is ideal -try to avoid foods/beverages that contain any ingredients with names you do not recognize  -try to avoid foods/beverages  with added sugar or sweeteners/sweets  -try to avoid sweet drinks (including diet drinks): soda, juice, Gatorade, sweet tea, power drinks, diet drinks -try to avoid white rice, white bread, pasta (unless whole grain)  EXERCISE GUIDELINES FOR ADULTS: -if you wish to increase your physical activity, do so gradually and with the approval of your doctor -STOP and seek medical care immediately if you have any chest pain, chest discomfort or trouble breathing when starting or increasing exercise  -move and stretch your body, legs, feet and arms when sitting for long periods -Physical activity guidelines for optimal health in adults: -get at least 150 minutes per week of moderate exercise (can talk, but not sing); this is about 20-30 minutes of sustained activity 5-7 days per week or two 10-15 minute episodes of sustained activity 5-7 days per week -do some muscle building/resistance training/strength training at least 2 days per week  -  balance exercises 3+ days per week:   Stand somewhere where you have something sturdy to hold onto if you lose balance    1) lift up on toes, then back down, start with 5x per day and work up to 20x   2) stand and lift one leg straight out to the side so that foot is a few inches of the floor, start with 5x each side and work up to 20x each side   3) stand on one foot, start with 5 seconds each side and work up to 20 seconds on each side  If you need ideas or help with getting more active:  -Silver sneakers https://tools.silversneakers.com  -Walk with  a Doc: http://www.duncan-williams.com/  -try to include resistance (weight lifting/strength building) and balance exercises twice per week: or the following link for ideas: http://castillo-powell.com/  BuyDucts.dk  STRESS MANAGEMENT: -can try meditating, or just sitting quietly with deep breathing while intentionally relaxing all parts of your body for 5 minutes daily -if you need further help with stress, anxiety or depression please follow up with your primary doctor or contact the wonderful folks at [facility]: [phone]  SOCIAL CONNECTIONS: -options in [place] if you wish to engage in more social and exercise related activities:  -Silver sneakers https://tools.silversneakers.com  -Walk with a Doc: http://www.duncan-williams.com/  -Check out the [facility] Active Adults 50+ section on the [place] [employer] (hiking clubs, book clubs, cards and games, chess, exercise classes, aquatic classes and much more) - see the website for details: https://www.[place].gov/departments/parks-recreation/active-adults50  -YouTube has lots of exercise videos for different ages and abilities as well  -[facility] (a variety of indoor and outdoor inperson activities for adults). [phone]. [address].  -Virtual Online Classes (a variety of topics): see seniorplanet.org or call [phone]  -consider volunteering at a school, hospice center, church, senior center or elsewhere            Chiquita JONELLE Cramp, DO

## 2024-08-05 DIAGNOSIS — D0461 Carcinoma in situ of skin of right upper limb, including shoulder: Secondary | ICD-10-CM | POA: Diagnosis not present

## 2024-08-05 DIAGNOSIS — Z85828 Personal history of other malignant neoplasm of skin: Secondary | ICD-10-CM | POA: Diagnosis not present

## 2024-08-05 DIAGNOSIS — L821 Other seborrheic keratosis: Secondary | ICD-10-CM | POA: Diagnosis not present

## 2024-08-05 DIAGNOSIS — D485 Neoplasm of uncertain behavior of skin: Secondary | ICD-10-CM | POA: Diagnosis not present

## 2024-08-06 ENCOUNTER — Other Ambulatory Visit: Payer: Self-pay | Admitting: Adult Health

## 2024-11-15 ENCOUNTER — Other Ambulatory Visit: Payer: Self-pay | Admitting: Adult Health

## 2024-11-15 DIAGNOSIS — I10 Essential (primary) hypertension: Secondary | ICD-10-CM
